# Patient Record
Sex: Female | Born: 1985
Health system: Southern US, Community
[De-identification: ages and names within clinical notes are randomized; demographics above are authoritative.]

## PROBLEM LIST (undated history)

## (undated) DIAGNOSIS — G4733 Obstructive sleep apnea (adult) (pediatric): Secondary | ICD-10-CM

## (undated) DIAGNOSIS — F419 Anxiety disorder, unspecified: Secondary | ICD-10-CM

## (undated) DIAGNOSIS — K219 Gastro-esophageal reflux disease without esophagitis: Secondary | ICD-10-CM

## (undated) DIAGNOSIS — D229 Melanocytic nevi, unspecified: Secondary | ICD-10-CM

## (undated) DIAGNOSIS — R739 Hyperglycemia, unspecified: Secondary | ICD-10-CM

## (undated) DIAGNOSIS — E785 Hyperlipidemia, unspecified: Secondary | ICD-10-CM

## (undated) DIAGNOSIS — G4730 Sleep apnea, unspecified: Secondary | ICD-10-CM

## (undated) DIAGNOSIS — Z9989 Dependence on other enabling machines and devices: Secondary | ICD-10-CM

## (undated) HISTORY — DX: Sleep apnea, unspecified: G47.30

## (undated) HISTORY — DX: Morbid (severe) obesity due to excess calories: E66.01

## (undated) HISTORY — DX: Hyperglycemia, unspecified: R73.9

## (undated) HISTORY — DX: Dependence on other enabling machines and devices: Z99.89

## (undated) HISTORY — DX: Gastro-esophageal reflux disease without esophagitis: K21.9

## (undated) HISTORY — DX: Melanocytic nevi, unspecified: D22.9

## (undated) HISTORY — PX: MOLE REMOVAL: SHX2046

## (undated) HISTORY — DX: Hyperlipidemia, unspecified: E78.5

## (undated) HISTORY — DX: Anxiety disorder, unspecified: F41.9

## (undated) HISTORY — PX: WISDOM TOOTH EXTRACTION: SHX21

## (undated) HISTORY — DX: Obstructive sleep apnea (adult) (pediatric): G47.33

## (undated) HISTORY — PX: OTHER SURGICAL HISTORY: SHX169

---
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
## 2004-10-06 ENCOUNTER — Other Ambulatory Visit: Admission: RE | Admit: 2004-10-06 | Discharge: 2004-10-06 | Payer: Self-pay | Admitting: Obstetrics and Gynecology

## 2005-11-28 ENCOUNTER — Other Ambulatory Visit: Admission: RE | Admit: 2005-11-28 | Discharge: 2005-11-28 | Payer: Self-pay | Admitting: Obstetrics and Gynecology

## 2006-05-07 HISTORY — PX: OTHER SURGICAL HISTORY: SHX169

## 2006-10-27 ENCOUNTER — Emergency Department (HOSPITAL_COMMUNITY): Admission: EM | Admit: 2006-10-27 | Discharge: 2006-10-27 | Payer: Self-pay | Admitting: Emergency Medicine

## 2006-12-03 ENCOUNTER — Other Ambulatory Visit: Admission: RE | Admit: 2006-12-03 | Discharge: 2006-12-03 | Payer: Self-pay | Admitting: Obstetrics & Gynecology

## 2007-12-04 ENCOUNTER — Other Ambulatory Visit: Admission: RE | Admit: 2007-12-04 | Discharge: 2007-12-04 | Payer: Self-pay | Admitting: Obstetrics and Gynecology

## 2010-09-22 NOTE — Op Note (Signed)
[name], Mary Brandt                           ACCOUNT NO.:  [id number]   MEDICAL RECORD NO.:  [id number]                   PATIENT TYPE:  AMB   LOCATION:  DSC                                  FACILITY:  [facility]   PHYSICIAN:  Brantley Persons, M.D.             DATE OF BIRTH:  1986-02-08   DATE OF PROCEDURE:  09/02/2003  DATE OF DISCHARGE:                                 OPERATIVE REPORT   PREOPERATIVE DIAGNOSIS:  Dysplastic nevus with high-grade atypia left  temporoparietal scalp.   POSTOPERATIVE DIAGNOSIS:  Dysplastic nevus with high-grade atypia left  temporoparietal scalp.   PROCEDURE:  1. Excision of 2.0-cm dysplastic nevus with high-grade atypia left     temporoparietal scalp.  2. Intermediate closure of 2.5-cm left scalp incision.   ANESTHESIA:  1% lidocaine with epinephrine.   COMPLICATIONS:  None.   INDICATIONS FOR PROCEDURE:  The patient is an 24 year old Caucasian female  who was referred by Dr. Campbell Stall for evaluation regarding excision of  this dysplastic nevus of the left scalp.  The specimen was biopsied using a  shave biopsy on May 08, 2003.  The pathology report indicates that the  lesion was a dysplastic melanocytic nevus with high-grade atypia that was  histologically worrisome.  Due to this, the lesion needs to undergo a wider  excision.  This has been discussed with the patient as well as her parents,  and they would like to proceed with that.  She is, therefore, brought to the  [facility] for excision of the dysplastic nevus as the  patient does have a history of unusual moles.   PROCEDURE:  The patient was brought into the minor room and placed on the  table in the supine position.  The left temporoparietal  scalp was prepped  with Betadine and draped in a sterile fashion.  The skin and subcutaneous  tissues in the area of the suspicious nevus were injected with 1% lidocaine  with epinephrine.  After adequate hemostasis and  anesthesia had taken  effect, the procedure was begun.  Using loupe magnification, the skin lesion  was examined.  There was a left irregular round, light to medium brown,  lesion with a clear central area.  At least a 4- to 5-mm circumferential  margin was marked around the skin lesion.  This skin lesion was thus excised  with an appropriate margin full thickness through the skin into the  subcutaneous tissues.  The specimen was marked at the 12 o'clock position  and passed off the table to undergo permanent pathologic section evaluation.  Total length of resected specimen was 2.0 cm.  The scalp was then undermined  for easier closure.  The galeal and deep dermal layers were closed using 4-0  Monocryl suture.  The scalp skin was then closed using 5-0 Prolene suture.  The incision was dressed with bacitracin ointment.  There were no  complications.  The patient tolerated the procedure well.  Proper  postoperative wound care instructions were discussed with both the patient  and her father.  She was then discharged home in her father's care in stable  condition.  Followup appointment will be tomorrow in the office.                                               Brantley Persons, M.D.    MC/MEDQ  D:  09/02/2003  T:  09/03/2003  Job:  [id number]

## 2011-06-05 ENCOUNTER — Emergency Department (HOSPITAL_COMMUNITY)
Admission: EM | Admit: 2011-06-05 | Discharge: 2011-06-05 | Disposition: A | Discharge status: Home / Self Care | Payer: 59 | Source: Home / Self Care | Attending: Family Medicine | Admitting: Family Medicine

## 2011-06-05 ENCOUNTER — Emergency Department (INDEPENDENT_AMBULATORY_CARE_PROVIDER_SITE_OTHER): Payer: 59

## 2011-06-05 ENCOUNTER — Encounter (HOSPITAL_COMMUNITY): Payer: Self-pay | Admitting: *Deleted

## 2011-06-05 DIAGNOSIS — J189 Pneumonia, unspecified organism: Secondary | ICD-10-CM

## 2011-06-05 DIAGNOSIS — B999 Unspecified infectious disease: Secondary | ICD-10-CM

## 2011-06-05 MED ORDER — AZITHROMYCIN 250 MG PO TABS: ORAL_TABLET | ORAL | Status: AC

## 2011-06-05 NOTE — ED Notes (Signed)
Pt  Reports  Symptoms  Of  Cough  /  Congestion        Body  Aches  X  10  Days  Symptoms  improved  Somewhat  Then  Came  Back

## 2011-06-05 NOTE — ED Provider Notes (Signed)
Mary Brandt is a 26 y.o. female who presents to Urgent Care today for coughing wheezing and nausea. She initially developed a URI a 1-1/2 weeks ago where she describes some cough and nasal congestion which he treated with Sudafed Mucinex.  However 2 days ago she worsened and now has wheezing coughing and nausea. She had sweating and chills but denies any fever.   She feels generally ill.   She denies any trouble breathing or chest pain, or vomiting or diarrhea.     PMH reviewed.  ROS as above otherwise neg Medications reviewed. No current facility-administered medications for this encounter.   No current outpatient prescriptions on file.    Exam:  BP 120/65  Pulse 73  Temp(Src) 98.7 F (37.1 C) (Oral)  Resp 20  SpO2 100% Gen: Well NAD HEENT: EOMI,  MMM Lungs:  Nl WOB with rhonchi and squeaks bilaterally Heart: RRR no MRG Abd: NABS, NT, ND Exts: Non edematous BL  LE, warm and well perfused.   CXR: No acute disease  Assessment and Plan: 26 year old woman presents with cough and wheezing worsening a week and a half into a upper respiratory infection. I'm concerned about secondary sickening with a bacterial agent. Her x-ray did not clear show a clear lobar consolidation therefore I think pneumococcal pneumonia is less likely. At her age range atypical pneumonia is more likely, therefore I feel a five-day course of azithromycin is warranted.  I asked her to follow with the primary care doctor if she is not improving.  Additionally provided a handout on pneumonia. She expresses understanding.     Clementeen Graham, MD 06/05/11 1740

## 2011-06-06 NOTE — ED Provider Notes (Signed)
Medical screening examination/treatment/procedure(s) were performed by PGY-3 FM resident and as supervising physician I was immediately available for consultation/collaboration.   Sharin Grave, MD   Sharin Grave, MD 06/06/11 [phone]

## 2011-06-26 HISTORY — PX: OTHER SURGICAL HISTORY: SHX169

## 2012-09-25 ENCOUNTER — Encounter: Payer: Self-pay | Admitting: Family Medicine

## 2012-09-25 ENCOUNTER — Ambulatory Visit (INDEPENDENT_AMBULATORY_CARE_PROVIDER_SITE_OTHER): Payer: 59 | Admitting: Family Medicine

## 2012-09-25 VITALS — BP 123/80 | HR 75 | Ht 68.0 in | Wt 320.0 lb

## 2012-09-25 DIAGNOSIS — M25511 Pain in right shoulder: Secondary | ICD-10-CM

## 2012-09-25 DIAGNOSIS — M25519 Pain in unspecified shoulder: Secondary | ICD-10-CM

## 2012-09-25 MED ORDER — TRAMADOL HCL 50 MG PO TABS: 50.0000 mg | ORAL_TABLET | Freq: Four times a day (QID) | ORAL | Status: DC | PRN

## 2012-09-25 MED ORDER — TIZANIDINE HCL 4 MG PO TABS: 4.0000 mg | ORAL_TABLET | Freq: Three times a day (TID) | ORAL | Status: DC

## 2012-09-25 MED ORDER — DICLOFENAC SODIUM 1 % TD GEL: 4.0000 g | Freq: Four times a day (QID) | TRANSDERMAL | Status: DC

## 2012-09-25 NOTE — Progress Notes (Signed)
  Subjective:    Patient ID: Eda Keys, female    DOB: 02/03/1986, 27 y.o.   MRN: [id number]  Murline is here today complaining of right shoulder pain.    Shoulder Pain  The pain is present in the right shoulder. This is a new problem. The current episode started more than 1 month ago. There has been no history of extremity trauma. The problem occurs constantly. The problem has been gradually worsening. The quality of the pain is described as aching. The pain is at a severity of 5/10. The pain is moderate. Pertinent negatives include no numbness. She has tried NSAIDS for the symptoms. The treatment provided no relief.   Review of Systems  Constitutional: Positive for activity change.  Musculoskeletal: Positive for arthralgias.       Pain in right shoulder  Neurological: Negative for weakness and numbness.   Past Medical History  Diagnosis Date  . Asthma   . Arthritis   . Morbid obesity    History   Social History Narrative   Marital Status: Married Government social research officer)    Children:  None    Pets:  None    Living Situation: Lives with his husband.   Occupation: Teacher, music     Education: Engineer, maintenance (IT) ([employer] worker [employer])    Alcohol Use:  Occasional   Tobacco Use:  None    Drug Use:  None   Diet:  Regular   Exercise:  None   Hobbies:  Reading, Movies                   Objective:   Physical Exam  Musculoskeletal:       Right shoulder: She exhibits decreased range of motion and tenderness. She exhibits no swelling, no effusion, no crepitus and normal strength.      Assessment & Plan:

## 2012-09-25 NOTE — Patient Instructions (Addendum)
1)  Shoulder Pain - Take 800 mg of Ibuprofen per day along with the Voltaren Gel up to 4 x per day. Add Tylenol 1000 mg up 3 times per day plus you can take a muscle relaxer.  Soak in a hot tub with Epsom Salt.  You can try a Thermacare but not over the gel.  If you continue to have pain, you can try some tramadol - start with 1 tab at night and slowly increase to up to 4 in a day.     Shoulder Pain The shoulder is the joint that connects your arms to your body. The bones that form the shoulder joint include the upper arm bone (humerus), the shoulder blade (scapula), and the collarbone (clavicle). The top of the humerus is shaped like a ball and fits into a rather flat socket on the scapula (glenoid cavity). A combination of muscles and strong, fibrous tissues that connect muscles to bones (tendons) support your shoulder joint and hold the ball in the socket. Small, fluid-filled sacs (bursae) are located in different areas of the joint. They act as cushions between the bones and the overlying soft tissues and help reduce friction between the gliding tendons and the bone as you move your arm. Your shoulder joint allows a wide range of motion in your arm. This range of motion allows you to do things like scratch your back or throw a ball. However, this range of motion also makes your shoulder more prone to pain from overuse and injury. Causes of shoulder pain can originate from both injury and overuse and usually can be grouped in the following four categories:  Redness, swelling, and pain (inflammation) of the tendon (tendinitis) or the bursae (bursitis).  Instability, such as a dislocation of the joint.  Inflammation of the joint (arthritis).  Broken bone (fracture). HOME CARE INSTRUCTIONS   Apply ice to the sore area.  Put ice in a plastic bag.  Place a towel between your skin and the bag.  Leave the ice on for 15-20 minutes, 3-4 times per day for the first 2 days.  Stop using cold packs if  they do not help with the pain.  If you have a shoulder sling or immobilizer, wear it as long as your caregiver instructs. Only remove it to shower or bathe. Move your arm as little as possible, but keep your hand moving to prevent swelling.  Squeeze a soft ball or foam pad as much as possible to help prevent swelling.  Only take over-the-counter or prescription medicines for pain, discomfort, or fever as directed by your caregiver. SEEK MEDICAL CARE IF:   Your shoulder pain increases, or new pain develops in your arm, hand, or fingers.  Your hand or fingers become cold and numb.  Your pain is not relieved with medicines. SEEK IMMEDIATE MEDICAL CARE IF:   Your arm, hand, or fingers are numb or tingling.  Your arm, hand, or fingers are significantly swollen or turn white or blue. MAKE SURE YOU:   Understand these instructions.  Will watch your condition.  Will get help right away if you are not doing well or get worse. Document Released: 01/31/2005 Document Revised: 01/16/2012 Document Reviewed: 04/07/2011 [facility] Patient Information 2014 [place], [place].  Shoulder Range of Motion Exercises The shoulder is the most flexible joint in the human body. Because of this it is also the most unstable joint in the body. All ages can develop shoulder problems. Early treatment of problems is necessary for a good  outcome. People react to shoulder pain by decreasing the movement of the joint. After a brief period of time, the shoulder can become "frozen". This is an almost complete loss of the ability to move the damaged shoulder. Following injuries your caregivers can give you instructions on exercises to keep your range of motion (ability to move your shoulder freely), or regain it if it has been lost.  EXERCISES EXERCISES TO MAINTAIN THE MOBILITY OF YOUR SHOULDER: Codman's Exercise or Pendulum Exercise  This exercise may be performed in a prone (face-down) lying position or standing while  leaning on a chair with the opposite arm. Its purpose is to relax the muscles in your shoulder and slowly but surely increase the range of motion and to relieve pain.  Lie on your stomach close to the side edge of the bed. Let your weak arm hang over the edge of the bed. Relax your shoulder, arm and hand. Let your shoulder blade relax and drop down.  Slowly and gently swing your arm forward and back. Do not use your neck muscles; relax them. It might be easier to have someone else gently start swinging your arm.  As pain decreases, increase your swing. To start, arm swing should begin at 15 degree angles. In time and as pain lessens, move to 30-45 degree angles. Start with swinging for about 15 seconds, and work towards swinging for 3 to 5 minutes.  This exercise may also be performed in a standing/bent over position.  Stand and hold onto a sturdy chair with your good arm. Bend forward at the waist and bend your knees slightly to help protect your back. Relax your weak arm, let it hang limp. Relax your shoulder blade and let it drop.  Keep your shoulder relaxed and use body motion to swing your arm in small circles.  Stand up tall and relax.  Repeat motion and change direction of circles.  Start with swinging for about 30 seconds, and work towards swinging for 3 to 5 minutes. STRETCHING EXERCISES:  Lift your arm out in front of you with the elbow bent at 90 degrees. Using your other arm gently pull the elbow forward and across your body.  Bend one arm behind you with the palm facing outward. Using the other arm, hold a towel or rope and reach this arm up above your head, then bend it at the elbow to move your wrist to behind your neck. Grab the free end of the towel with the hand behind your back. Gently pull the towel up with the hand behind your neck, gradually increasing the pull on the hand behind the small of your back. Then, gradually pull down with the hand behind the small of your  back. This will pull the hand and arm behind your neck further. Both shoulders will have an increased range of motion with repetition of this exercise. STRENGTHENING EXERCISES:  Standing with your arm at your side and straight out from your shoulder with the elbow bent at 90 degrees, hold onto a small weight and slowly raise your hand so it points straight up in the air. Repeat this five times to begin with, and gradually increase to ten times. Do this four times per day. As you grow stronger you can gradually increase the weight.  Repeat the above exercise, only this time using an elastic band. Start with your hand up in the air and pull down until your hand is by your side. As you grow stronger, gradually increase  the amount you pull by increasing the number or size of the elastic bands. Use the same amount of repetitions.  Standing with your hand at your side and holding onto a weight, gradually lift the hand in front of you until it is over your head. Do the same also with the hand remaining at your side and lift the hand away from your body until it is again over your head. Repeat this five times to begin with, and gradually increase to ten times. Do this four times per day. As you grow stronger you can gradually increase the weight. Document Released: 01/20/2003 Document Revised: 07/16/2011 Document Reviewed: 04/23/2005 [facility] Patient Information 2014 [place], [place].

## 2012-10-11 ENCOUNTER — Encounter: Payer: Self-pay | Admitting: Family Medicine

## 2012-10-11 DIAGNOSIS — M25519 Pain in unspecified shoulder: Secondary | ICD-10-CM | POA: Insufficient documentation

## 2012-10-11 NOTE — Assessment & Plan Note (Addendum)
She has received a steroid injection from her husband who is a Scientist, clinical (histocompatibility and immunogenetics).  She says that it really did not help her pain.  She was given several medications for pain.  If she does not improve, she'll follow up with Dr. Pearletha Forge.

## 2012-11-12 ENCOUNTER — Other Ambulatory Visit: Payer: Self-pay | Admitting: Dermatology

## 2012-12-22 ENCOUNTER — Encounter: Payer: Self-pay | Admitting: Certified Nurse Midwife

## 2012-12-22 ENCOUNTER — Telehealth: Payer: Self-pay | Admitting: Certified Nurse Midwife

## 2012-12-22 NOTE — Telephone Encounter (Signed)
Patient wants to know when she she got her Implanon.

## 2012-12-22 NOTE — Telephone Encounter (Signed)
Patient notified of Implanon insertion 06/26/2011. Per Dr. Tresa Res. Noted in paper chart. Patient appreciative of this information.

## 2013-01-07 ENCOUNTER — Encounter: Payer: Self-pay | Admitting: Certified Nurse Midwife

## 2013-01-09 ENCOUNTER — Ambulatory Visit: Payer: Self-pay | Admitting: Certified Nurse Midwife

## 2013-01-13 ENCOUNTER — Ambulatory Visit: Payer: Self-pay | Admitting: Nurse Practitioner

## 2013-01-13 ENCOUNTER — Ambulatory Visit: Payer: Self-pay | Admitting: Certified Nurse Midwife

## 2013-01-20 ENCOUNTER — Ambulatory Visit: Payer: Self-pay | Admitting: Certified Nurse Midwife

## 2013-01-30 ENCOUNTER — Ambulatory Visit: Payer: Self-pay | Admitting: Nurse Practitioner

## 2013-06-07 IMAGING — CR DG CHEST 2V
2 series · 2 of 2 positions shown · non-contrast
Comparison: None.

CLINICAL DATA: Pneumonia.  Cough and congestion.

CHEST - 2 VIEW

[view not recorded (1 of 2)]
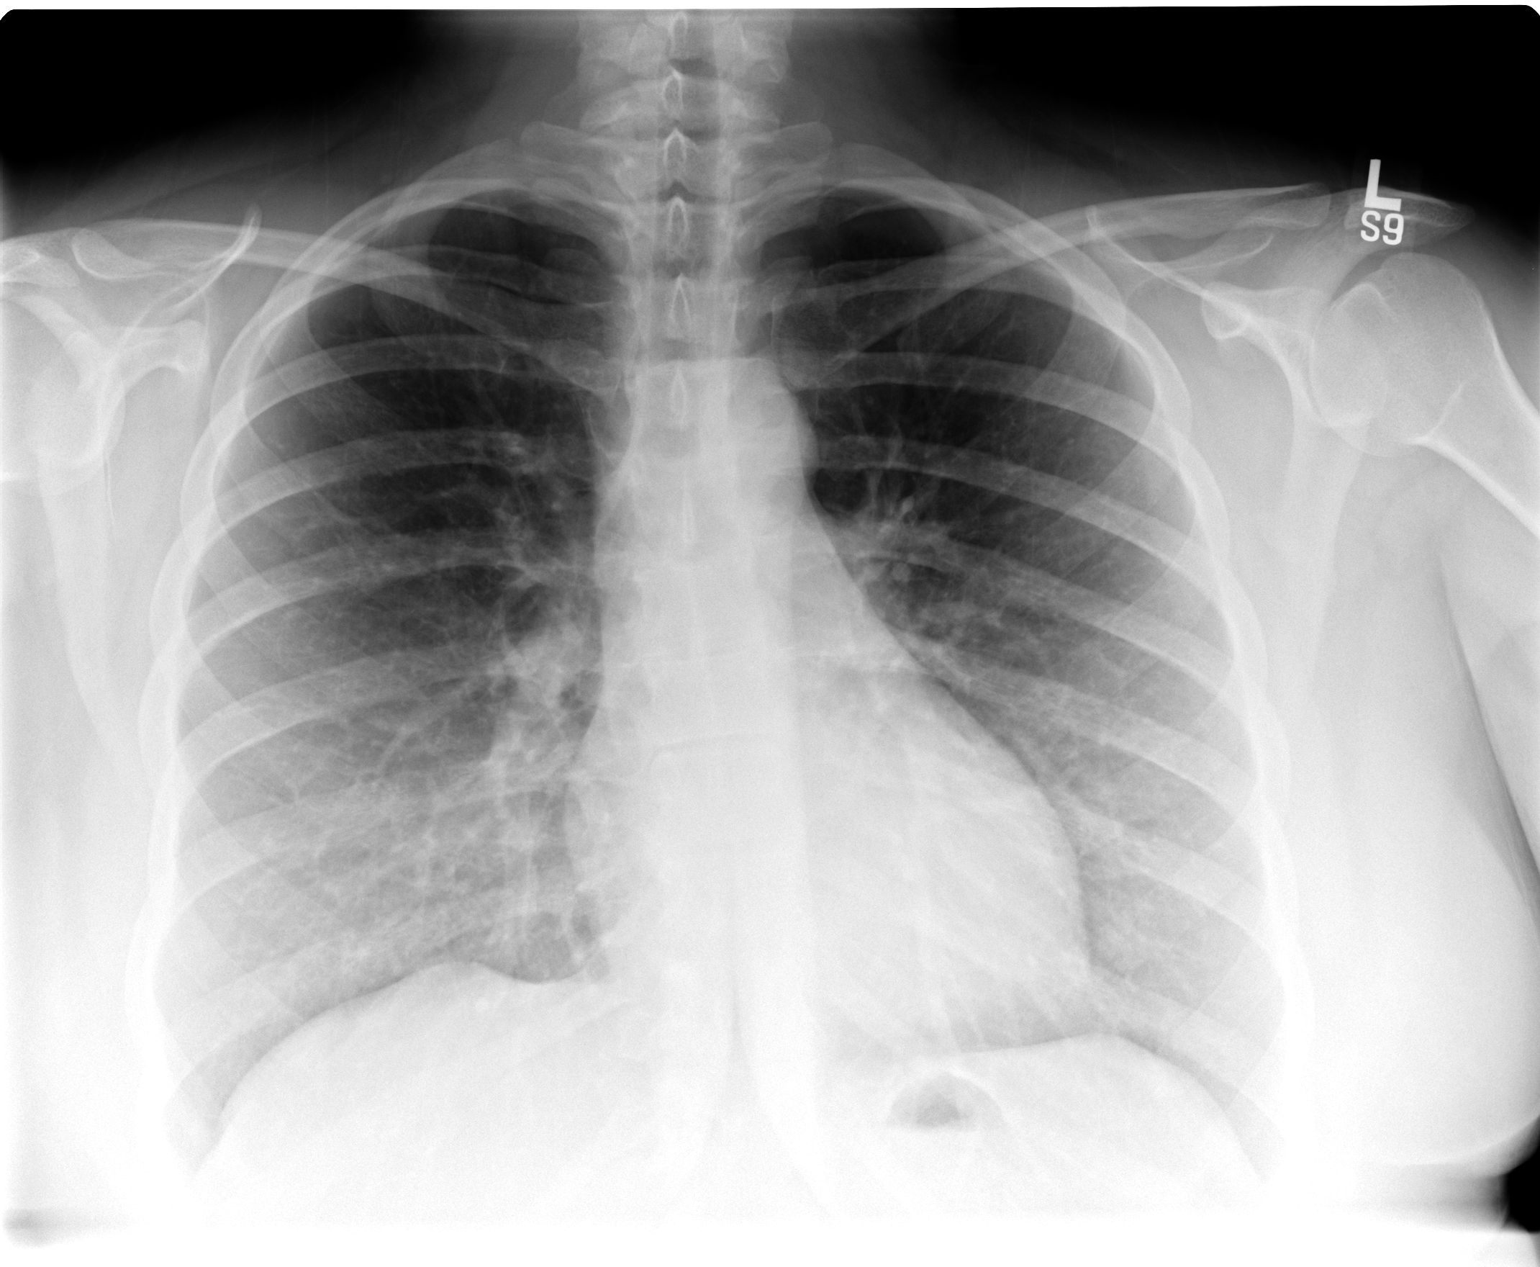

[view not recorded (2 of 2)]
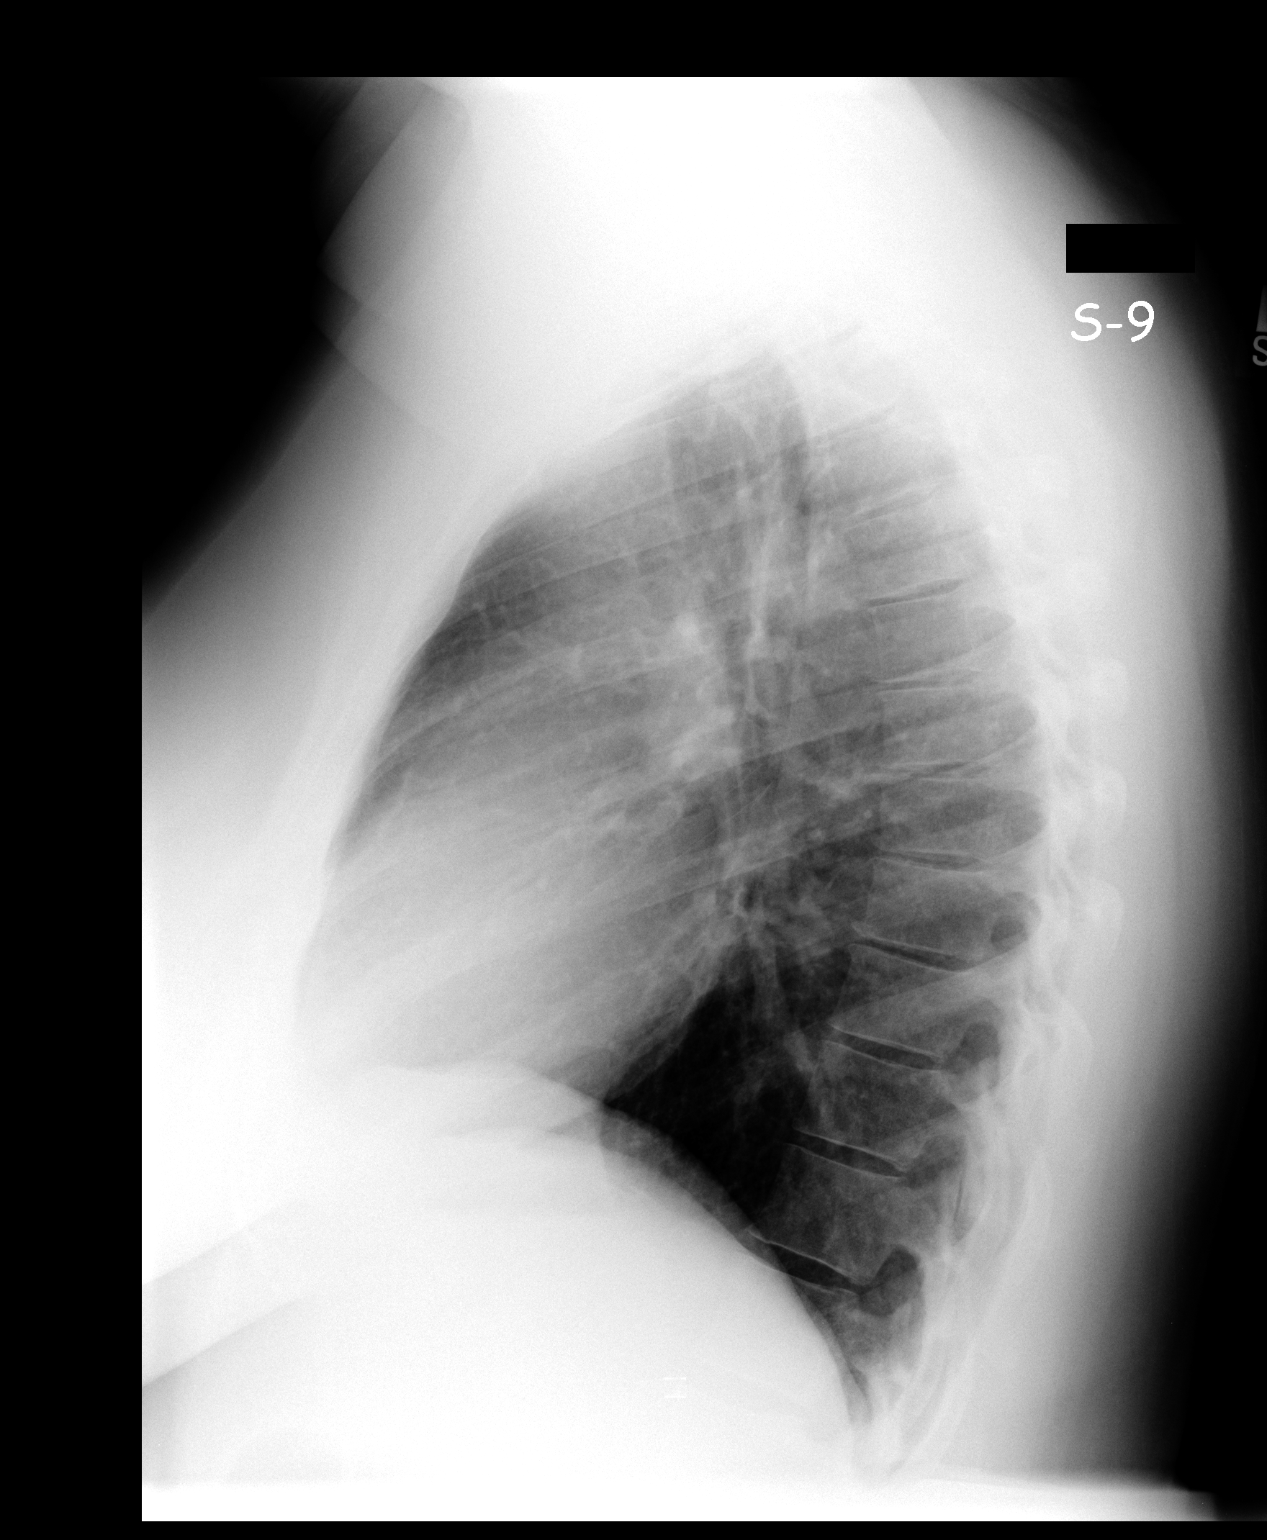

[2 of 2 positions shown; findings below may reference images not displayed]

FINDINGS: Cardiopericardial silhouette within normal limits.
Mediastinal contours normal. Trachea midline.  No airspace disease
or effusion.
IMPRESSION: No active cardiopulmonary disease.

## 2013-06-29 ENCOUNTER — Encounter (INDEPENDENT_AMBULATORY_CARE_PROVIDER_SITE_OTHER): Payer: Self-pay

## 2013-06-29 ENCOUNTER — Encounter (HOSPITAL_COMMUNITY): Payer: Self-pay | Admitting: Psychiatry

## 2013-06-29 ENCOUNTER — Ambulatory Visit (INDEPENDENT_AMBULATORY_CARE_PROVIDER_SITE_OTHER): Payer: 59 | Admitting: Psychiatry

## 2013-06-29 VITALS — BP 125/88 | HR 81 | Ht 67.0 in | Wt 342.0 lb

## 2013-06-29 DIAGNOSIS — Z79899 Other long term (current) drug therapy: Secondary | ICD-10-CM

## 2013-06-29 DIAGNOSIS — F3289 Other specified depressive episodes: Secondary | ICD-10-CM

## 2013-06-29 DIAGNOSIS — F411 Generalized anxiety disorder: Secondary | ICD-10-CM

## 2013-06-29 DIAGNOSIS — F329 Major depressive disorder, single episode, unspecified: Secondary | ICD-10-CM

## 2013-06-29 DIAGNOSIS — F419 Anxiety disorder, unspecified: Secondary | ICD-10-CM

## 2013-06-29 MED ORDER — DULOXETINE HCL 60 MG PO CPEP: 60.0000 mg | ORAL_CAPSULE | Freq: Every day | ORAL | Status: DC
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
## 2013-07-22 ENCOUNTER — Ambulatory Visit (HOSPITAL_COMMUNITY): Payer: Self-pay | Admitting: Psychiatry

## 2013-07-29 ENCOUNTER — Encounter (HOSPITAL_COMMUNITY): Payer: Self-pay | Admitting: Psychiatry

## 2013-07-29 NOTE — Progress Notes (Signed)
Patient is no-show today. This encounter was created in error - please disregard. This encounter was created in error - please disregard. This encounter was created in error - please disregard. Patient is a no-show today. This encounter was created in error - please disregard.

## 2013-10-05 ENCOUNTER — Other Ambulatory Visit (HOSPITAL_COMMUNITY): Payer: Self-pay | Admitting: Psychiatry

## 2013-10-05 LAB — COMPREHENSIVE METABOLIC PANEL
ALT: 16 U/L (ref 0–35)
AST: 12 U/L (ref 0–37)
Albumin: 3.8 g/dL (ref 3.5–5.2)
Alkaline Phosphatase: 101 U/L (ref 39–117)
BILIRUBIN TOTAL: 0.2 mg/dL (ref 0.2–1.2)
BUN: 17 mg/dL (ref 6–23)
CO2: 25 meq/L (ref 19–32)
CREATININE: 0.89 mg/dL (ref 0.50–1.10)
Calcium: 9.4 mg/dL (ref 8.4–10.5)
Chloride: 102 mEq/L (ref 96–112)
Glucose, Bld: 99 mg/dL (ref 70–99)
Potassium: 4.2 mEq/L (ref 3.5–5.3)
Sodium: 137 mEq/L (ref 135–145)
Total Protein: 6.9 g/dL (ref 6.0–8.3)

## 2013-10-05 LAB — CBC WITH DIFFERENTIAL/PLATELET
Basophils Absolute: 0 10*3/uL (ref 0.0–0.1)
Basophils Relative: 0 % (ref 0–1)
EOS PCT: 2 % (ref 0–5)
Eosinophils Absolute: 0.2 10*3/uL (ref 0.0–0.7)
HEMATOCRIT: 38.5 % (ref 36.0–46.0)
HEMOGLOBIN: 12.8 g/dL (ref 12.0–15.0)
LYMPHS ABS: 2.4 10*3/uL (ref 0.7–4.0)
LYMPHS PCT: 28 % (ref 12–46)
MCH: 27.1 pg (ref 26.0–34.0)
MCHC: 33.2 g/dL (ref 30.0–36.0)
MCV: 81.4 fL (ref 78.0–100.0)
MONO ABS: 0.6 10*3/uL (ref 0.1–1.0)
MONOS PCT: 7 % (ref 3–12)
Neutro Abs: 5.4 10*3/uL (ref 1.7–7.7)
Neutrophils Relative %: 63 % (ref 43–77)
Platelets: 386 10*3/uL (ref 150–400)
RBC: 4.73 MIL/uL (ref 3.87–5.11)
RDW: 15.8 % — AB (ref 11.5–15.5)
WBC: 8.6 10*3/uL (ref 4.0–10.5)

## 2013-10-05 LAB — LIPID PANEL
CHOLESTEROL: 169 mg/dL (ref 0–200)
HDL: 55 mg/dL (ref >39)
LDL CALC: 101 mg/dL — AB (ref 0–99)
TRIGLYCERIDES: 67 mg/dL (ref <150)
Total CHOL/HDL Ratio: 3.1 Ratio
VLDL: 13 mg/dL (ref 0–40)

## 2013-10-05 LAB — TSH: TSH: 1.576 u[IU]/mL (ref 0.350–4.500)

## 2013-10-05 LAB — HEMOGLOBIN A1C
Hgb A1c MFr Bld: 5.9 % — ABNORMAL HIGH (ref <5.7)
MEAN PLASMA GLUCOSE: 123 mg/dL — AB (ref <117)

## 2013-10-07 ENCOUNTER — Encounter (HOSPITAL_COMMUNITY): Payer: Self-pay | Admitting: Psychiatry

## 2013-10-07 ENCOUNTER — Ambulatory Visit (INDEPENDENT_AMBULATORY_CARE_PROVIDER_SITE_OTHER): Payer: 59 | Admitting: Psychiatry

## 2013-10-07 VITALS — BP 138/78 | HR 82 | Ht 67.0 in | Wt 346.0 lb

## 2013-10-07 DIAGNOSIS — F3289 Other specified depressive episodes: Secondary | ICD-10-CM

## 2013-10-07 DIAGNOSIS — F329 Major depressive disorder, single episode, unspecified: Secondary | ICD-10-CM

## 2013-10-07 DIAGNOSIS — F419 Anxiety disorder, unspecified: Secondary | ICD-10-CM

## 2013-10-07 DIAGNOSIS — F411 Generalized anxiety disorder: Secondary | ICD-10-CM

## 2013-10-07 MED ORDER — DULOXETINE HCL 60 MG PO CPEP: 60.0000 mg | ORAL_CAPSULE | Freq: Every day | ORAL | Status: DC
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
## 2014-01-07 ENCOUNTER — Encounter (HOSPITAL_COMMUNITY): Payer: Self-pay | Admitting: Psychiatry

## 2014-01-07 ENCOUNTER — Ambulatory Visit (INDEPENDENT_AMBULATORY_CARE_PROVIDER_SITE_OTHER): Payer: 59 | Admitting: Psychiatry

## 2014-01-07 VITALS — BP 108/83 | HR 80 | Ht 67.0 in | Wt 358.0 lb

## 2014-01-07 DIAGNOSIS — F411 Generalized anxiety disorder: Secondary | ICD-10-CM

## 2014-01-07 DIAGNOSIS — F3289 Other specified depressive episodes: Secondary | ICD-10-CM

## 2014-01-07 DIAGNOSIS — F329 Major depressive disorder, single episode, unspecified: Secondary | ICD-10-CM

## 2014-01-07 MED ORDER — DULOXETINE HCL 30 MG PO CPEP: 30.0000 mg | ORAL_CAPSULE | Freq: Every day | ORAL | Status: DC

## 2014-01-07 MED ORDER — BUPROPION HCL ER (XL) 150 MG PO TB24: ORAL_TABLET | ORAL | Status: DC

## 2014-01-07 NOTE — Progress Notes (Signed)
[facility] [phone] Progress Note  Mary Brandt [id number] 28 y.o.  01/07/2014 8:58 AM  Chief Complaint:  I have gained a lot of weight.  I want to try different medication.    History of Present Illness:  Mary Brandt came for her followup appointment.  She is taking Cymbalta 60 mg daily.  On her last visit with how to blood weight gain, regular exercise and watching her calorie intake.  She started these things however she continued to have issues controlling her weight.  She has gained weight from the last visit.  She wants to try a different medication.  She started free classes online which is offered by [employer].  She is hoping to take exam next July.  She denies any major panic attack anxiety attack.  She sleeping better.  Her energy level is okay.  She denies any crying spells, irritability or any anger.  She is scheduled to see her primary care physician in this month since her previous physician at the practice.  Patient denies any paranoia or any hallucination.  She denies any tremors or shakes.  Her vitals are stable except she has gained weight from the last visit.  Patient does not drink or use any illegal substances.  She lives with her husband who is very supportive.  She has no children.    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  She endorsed symptoms of social isolation, lack of energy, irritability, panic attack, excessive anxiety , withdrawn and lack of interest in her daily activities.  [facility] which worked very well until December 2014 she stopped.  She had significant withdrawal symptoms when she stops Cymbalta.   Anxiety: Yes Bipolar Disorder: No Depression:  Yes Mania: No Psychosis: No Schizophrenia: No Personality Disorder: No Hospitalization for psychiatric illness: No History of Electroconvulsive Shock Therapy: No Prior Suicide Attempts: No  Medical History; Patient has morbid obesity.    Review of Systems: Psychiatric: Agitation: No Hallucination: No Depressed Mood: No Insomnia: No Hypersomnia: No Altered Concentration: No Feels Worthless: No Grandiose Ideas: No Belief In Special Powers: No New/Increased Substance Abuse: No Compulsions: No  Neurologic: Headache: No Seizure: No Paresthesias: No   Outpatient Encounter Prescriptions as of 01/07/2014  Medication Sig  . buPROPion (WELLBUTRIN XL) 150 MG 24 hr tablet Take 1 tab daily in am for 2 weeks and than 2 tab daily  . DULoxetine (CYMBALTA) 30 MG capsule Take 1 capsule (30 mg total) by mouth daily.  . [DISCONTINUED] DULoxetine (CYMBALTA) 60 MG capsule Take 1 capsule (60 mg total) by mouth daily.    No results found for this or any previous visit (from the past 2160 hour(s)).    Physical Exam: Constitutional:  BP 108/83  Pulse 80  Ht 5\' 7"  (1.702 m)  Wt 358 lb (162.388 kg)  BMI 56.06 kg/m2  Musculoskeletal: Strength & Muscle Tone: within normal limits Gait & Station: normal Patient leans: N/A  Mental Status Examination;  Patient is casually dressed and fairly groomed.  She is cooperative.  She maintains fair fair eye contact.  Her speech is clear and coherent.  Her thought process is logical and goal-directed.  She describes her mood anxious because of weight gain  and her affect is mood appropriate.  She denies any auditory or visual hallucination.  There were no paranoia, delusions obsession present at this time.  Her fund of knowledge is adequate.  She has no tremors or shakes.  She denies any active or passive suicidal thoughts or homicidal thoughts.  There were no flight of ideas or any loose association.  She is alert and oriented x3.  Her insight judgment  and impulse control is okay.   Established Problem, Stable/Improving (1), Review of Psycho-Social Stressors (1), Review or order clinical lab tests (1), New Problem, with no additional work-up planned (3), Review of Last Therapy Session (1), Review of Medication Regimen & Side Effects (2) and Review of New Medication or Change in Dosage (2)  Assessment: Axis I: Anxiety disorder NOS, depressive disorder NOS  Axis II: Deferred  Axis III:  Past Medical History  Diagnosis Date  . Asthma   . Arthritis   . Morbid obesity     Axis IV: Mild to moderate   Plan:  The patient wants to try a different medication.  She has done some research on Wellbutrin which may cause weight loss.  I had a long discussion with the patient about medication side effects.  I reviewed her records that last year when she was taking Cymbalta 120 mg from her previous provider, her weight was 320 pound and now she is taking Cymbalta only 60 mg she has not able to lose the weight.  She also mentioned that she has family history of obesity.  Her sister, father has obesity and her mother also has issues with her weight.  Patient remember started weight gain when she was 28 years old.  I explained that she has withdrawal symptoms from Cymbalta and if we cut down to Cymbalta or stop the Cymbalta Carbatrol symptoms may come back.  I do not believe Cymbalta is the only reason that she is gaining weight .  However patient insist to try Wellbutrin .  I will do cross taper , start Wellbutrin XL 150 mg one a day for 2 weeks and gradually increased to 300 and due to Cymbalta 30 mg for 2 weeks and then stopping it.  I recommended to call us back if she has worsening of her anxiety symptoms.  Explained that Wellbutrin can cause him to be getting headaches, nausea and tremors.  I also suggested that she should talk to her primary care physician on her upcoming visit about weight loss program and other method including bariatric surgery for  weight loss.  Patient acknowledged and agreed.  Recommended to call us back if she has any question or any concern.  I will see her again in 4 weeks.  More than 50% of the time spent in psychoeducation, counseling and coordination of care.  Discuss safety plan that anytime having active suicidal thoughts or homicidal thoughts then patient need to call 911 or go to the local emergency room.    ARFEEN,SYED T., MD 01/07/2014

## 2014-01-26 ENCOUNTER — Encounter: Payer: Self-pay | Admitting: Family

## 2014-01-26 ENCOUNTER — Telehealth: Payer: Self-pay | Admitting: Certified Nurse Midwife

## 2014-01-26 ENCOUNTER — Ambulatory Visit (INDEPENDENT_AMBULATORY_CARE_PROVIDER_SITE_OTHER): Payer: 59 | Admitting: Family

## 2014-01-26 VITALS — BP 120/80 | HR 86 | Ht 68.0 in | Wt 357.7 lb

## 2014-01-26 DIAGNOSIS — R739 Hyperglycemia, unspecified: Secondary | ICD-10-CM | POA: Insufficient documentation

## 2014-01-26 DIAGNOSIS — R7309 Other abnormal glucose: Secondary | ICD-10-CM

## 2014-01-26 DIAGNOSIS — K219 Gastro-esophageal reflux disease without esophagitis: Secondary | ICD-10-CM | POA: Insufficient documentation

## 2014-01-26 DIAGNOSIS — F411 Generalized anxiety disorder: Secondary | ICD-10-CM

## 2014-01-26 LAB — COMPREHENSIVE METABOLIC PANEL
ALBUMIN: 3.6 g/dL (ref 3.5–5.2)
ALT: 21 U/L (ref 0–35)
AST: 17 U/L (ref 0–37)
Alkaline Phosphatase: 94 U/L (ref 39–117)
BUN: 14 mg/dL (ref 6–23)
CHLORIDE: 104 meq/L (ref 96–112)
CO2: 26 meq/L (ref 19–32)
Calcium: 8.7 mg/dL (ref 8.4–10.5)
Creatinine, Ser: 0.9 mg/dL (ref 0.4–1.2)
GFR: 75.68 mL/min (ref >60.00)
GLUCOSE: 89 mg/dL (ref 70–99)
POTASSIUM: 3.7 meq/L (ref 3.5–5.1)
SODIUM: 136 meq/L (ref 135–145)
TOTAL PROTEIN: 7.8 g/dL (ref 6.0–8.3)
Total Bilirubin: 0.4 mg/dL (ref 0.2–1.2)

## 2014-01-26 LAB — HEMOGLOBIN A1C: Hgb A1c MFr Bld: 5.9 % (ref 4.6–6.5)

## 2014-01-26 MED ORDER — OMEPRAZOLE 40 MG PO CPDR: 40.0000 mg | DELAYED_RELEASE_CAPSULE | Freq: Every day | ORAL | Status: DC

## 2014-01-26 NOTE — Progress Notes (Signed)
Subjective:    Patient ID: Mary Brandt, female    DOB: [birth year], 28 y.o.   MRN: [id number]  HPI 28 year old white female, obese with a history of depression and anxiety is in today to be established. Recently developed recurrence of hyperglycemia that was identified by psychiatry. A1c was 5.9. Reports exercising 2-3 times per week doing cardio on the elliptical or treadmill. Sees gynecology for female care. Last Pap smear was in 2013 was normal. Reports her sister was recently diagnosed with polycystic ovarian syndrome and therefore has concerns. Father has type 2 diabetes.  Reports a history of GERD and currently takes omeprazole 20 mg daily and Zantac as needed. However, recently has been having more heartburn and indigestion particularly first thing in the morning. Denies any dietary correlation.    Review of Systems  Constitutional: Negative.   Respiratory: Negative.   Cardiovascular: Negative.   Gastrointestinal: Negative for diarrhea, constipation and abdominal distention.       Heartburn and indigestion  Endocrine: Negative.   Genitourinary: Negative.   Musculoskeletal: Negative.   Skin: Negative.   Allergic/Immunologic: Negative.   Neurological: Negative.   Hematological: Negative.   Psychiatric/Behavioral: Negative.    Past Medical History  Diagnosis Date  . Asthma   . Arthritis   . Morbid obesity     History   Social History  . Marital Status: Married    Spouse Name: Mortimer Fries    Number of Children: 0  . Years of Education: 9   Occupational History  . LEGAL ASSISTANT    . [employer] History Main Topics  . Smoking status: Never Smoker   . Smokeless tobacco: Not on file  . Alcohol Use: No     Comment: Occasional   . Drug Use: No  . Sexual Activity: Yes    Partners: Male    Birth Control/ Protection: IUD   Other Topics Concern  . Not on file   Social History Narrative   Marital Status: Married Chief Operating Officer)    Children:  None    Pets:  None    Living Situation: Lives with his husband.   Occupation: Civil Service fast streamer     Education: Forensic psychologist ([employer]) [employer])    Alcohol Use:  Occasional   Tobacco Use:  None    Drug Use:  None   Diet:  Regular   Exercise:  None   Hobbies:  Reading, Movies                 Past Surgical History  Procedure Laterality Date  . Other surgical history Left [id number]     Implanon  . Intrauterine device (iud) insertion    . Wisdom tooth extraction    . Mole removal      Family History  Problem Relation Age of Onset  . Atrial fibrillation Mother   . Diabetes Father     No Known Allergies  Current Outpatient Prescriptions on File Prior to Visit  Medication Sig Dispense Refill  . buPROPion (WELLBUTRIN XL) 150 MG 24 hr tablet Take 1 tab daily in am for 2 weeks and than 2 tab daily  60 tablet  0  . DULoxetine (CYMBALTA) 30 MG capsule Take 1 capsule (30 mg total) by mouth daily.  30 capsule  0   No current facility-administered medications on file prior to visit.    BP 120/80  Pulse 86  Ht 5\' 8"  (1.727 m)  Wt 357  lb 11.2 oz (162.252 kg)  BMI 54.40 kg/m2  LMP 09/01/2015chart    Objective:   Physical Exam  Constitutional: She is oriented to person, place, and time. She appears well-developed and well-nourished.  Morbidly obese  HENT:  Right Ear: External ear normal.  Left Ear: External ear normal.  Nose: Nose normal.  Mouth/Throat: Oropharynx is clear and moist.  Neck: Normal range of motion. Neck supple. No thyromegaly present.  Cardiovascular: Normal rate, regular rhythm and normal heart sounds.   Pulmonary/Chest: Effort normal and breath sounds normal.  Abdominal: Soft. Bowel sounds are normal.  Musculoskeletal: Normal range of motion.  Neurological: She is alert and oriented to person, place, and time.  Skin: Skin is warm and dry.  Psychiatric: She has a normal mood and affect.          Assessment &  Plan:  Kaetlyn was seen today for establish care.  Diagnoses and associated orders for this visit:  Hyperglycemia - Ambulatory referral to diabetic education - Hemoglobin A1c - CMP  Morbid obesity - Ambulatory referral to diabetic education - Hemoglobin A1c - CMP  Gastroesophageal reflux disease without esophagitis  Generalized anxiety disorder  Other Orders - omeprazole (PRILOSEC) 40 MG capsule; Take 1 capsule (40 mg total) by mouth daily.    Follow with gynecology as scheduled for concerns of polycystic ovarian syndrome. Increase omeprazole to 40 mg to help combat GERD. GERD family precautions provided today. Weight reduction strongly encouraged. Exercise 45 minutes at least 4 days per week. Diabetes education class.

## 2014-01-26 NOTE — Telephone Encounter (Signed)
Patient is asking when does she need to have Implanon exchanged.?

## 2014-01-26 NOTE — Telephone Encounter (Signed)
Left message to call [facility] at [phone]. Need to notify patient will need exchange before 06/25/2014.  Ellene Route, LPN at 9/56/2130  8:65 PM      Status: Signed            Patient notified of Implanon insertion 06/26/2011. Per Dr. Joan Flores. Noted in paper chart. Patient appreciative of this information.         Darrel Reach at 12/22/2012  2:17 PM      Status: Signed            Patient wants to know when she she got her Implanon.

## 2014-01-26 NOTE — Progress Notes (Signed)
Pre visit review using our clinic review tool, if applicable. No additional management support is needed unless otherwise documented below in the visit note. 

## 2014-01-26 NOTE — Patient Instructions (Signed)
Food Choices for Gastroesophageal Reflux Disease When you have gastroesophageal reflux disease (GERD), the foods you eat and your eating habits are very important. Choosing the right foods can help ease the discomfort of GERD. WHAT GENERAL GUIDELINES DO I NEED TO FOLLOW?  Choose fruits, vegetables, whole grains, low-fat dairy products, and low-fat meat, fish, and poultry.  Limit fats such as oils, salad dressings, butter, nuts, and avocado.  Keep a food diary to identify foods that cause symptoms.  Avoid foods that cause reflux. These may be different for different people.  Eat frequent small meals instead of three large meals each day.  Eat your meals slowly, in a relaxed setting.  Limit fried foods.  Cook foods using methods other than frying.  Avoid drinking alcohol.  Avoid drinking large amounts of liquids with your meals.  Avoid bending over or lying down until 2-3 hours after eating. WHAT FOODS ARE NOT RECOMMENDED? The following are some foods and drinks that may worsen your symptoms: Vegetables Tomatoes. Tomato juice. Tomato and spaghetti sauce. Chili peppers. Onion and garlic. Horseradish. Fruits Oranges, grapefruit, and lemon (fruit and juice). Meats High-fat meats, fish, and poultry. This includes hot dogs, ribs, ham, sausage, salami, and bacon. Dairy Whole milk and chocolate milk. Sour cream. Cream. Butter. Ice cream. Cream cheese.  Beverages Coffee and tea, with or without caffeine. Carbonated beverages or energy drinks. Condiments Hot sauce. Barbecue sauce.  Sweets/Desserts Chocolate and cocoa. Donuts. Peppermint and spearmint. Fats and Oils High-fat foods, including [place] fries and potato chips. Other Vinegar. Strong spices, such as black pepper, white pepper, red pepper, cayenne, curry powder, cloves, ginger, and chili powder. The items listed above may not be a complete list of foods and beverages to avoid. Contact your dietitian for more  information. Document Released: 04/23/2005 Document Revised: 04/28/2013 Document Reviewed: 02/25/2013 [facility] Patient Information 2015 [place], [place]. This information is not intended to replace advice given to you by your health care provider. Make sure you discuss any questions you have with your health care provider.   Diabetes and Exercise Exercising regularly is important. It is not just about losing weight. It has many health benefits, such as:  Improving your overall fitness, flexibility, and endurance.  Increasing your bone density.  Helping with weight control.  Decreasing your body fat.  Increasing your muscle strength.  Reducing stress and tension.  Improving your overall health. People with diabetes who exercise gain additional benefits because exercise:  Reduces appetite.  Improves the body's use of blood sugar (glucose).  Helps lower or control blood glucose.  Decreases blood pressure.  Helps control blood lipids (such as cholesterol and triglycerides).  Improves the body's use of the hormone insulin by:  Increasing the body's insulin sensitivity.  Reducing the body's insulin needs.  Decreases the risk for heart disease because exercising:  Lowers cholesterol and triglycerides levels.  Increases the levels of good cholesterol (such as high-density lipoproteins [HDL]) in the body.  Lowers blood glucose levels. YOUR ACTIVITY PLAN  Choose an activity that you enjoy and set realistic goals. Your health care provider or diabetes educator can help you make an activity plan that works for you. Exercise regularly as directed by your health care provider. This includes:  Performing resistance training twice a week such as push-ups, sit-ups, lifting weights, or using resistance bands.  Performing 150 minutes of cardio exercises each week such as walking, running, or playing sports.  Staying active and spending no more than 90 minutes at one time being  inactive. Even short bursts of exercise are good for you. Three 10-minute sessions spread throughout the day are just as beneficial as a single 30-minute session. Some exercise ideas include:  Taking the dog for a walk.  Taking the stairs instead of the elevator.  Dancing to your favorite song.  Doing an exercise video.  Doing your favorite exercise with a friend. RECOMMENDATIONS FOR EXERCISING WITH TYPE 1 OR TYPE 2 DIABETES   Check your blood glucose before exercising. If blood glucose levels are greater than 240 mg/dL, check for urine ketones. Do not exercise if ketones are present.  Avoid injecting insulin into areas of the body that are going to be exercised. For example, avoid injecting insulin into:  The arms when playing tennis.  The legs when jogging.  Keep a record of:  Food intake before and after you exercise.  Expected peak times of insulin action.  Blood glucose levels before and after you exercise.  The type and amount of exercise you have done.  Review your records with your health care provider. Your health care provider will help you to develop guidelines for adjusting food intake and insulin amounts before and after exercising.  If you take insulin or oral hypoglycemic agents, watch for signs and symptoms of hypoglycemia. They include:  Dizziness.  Shaking.  Sweating.  Chills.  Confusion.  Drink plenty of water while you exercise to prevent dehydration or heat stroke. Body water is lost during exercise and must be replaced.  Talk to your health care provider before starting an exercise program to make sure it is safe for you. Remember, almost any type of activity is better than none. Document Released: 07/14/2003 Document Revised: 09/07/2013 Document Reviewed: 09/30/2012 [facility] Patient Information 2015 [place], [place]. This information is not intended to replace advice given to you by your health care provider. Make sure you discuss any questions  you have with your health care provider.

## 2014-01-27 NOTE — Telephone Encounter (Signed)
Pt notified of message below. She stated she has a appt coming up and will get more information about getting the exchange then. She agreed and voiced understanding. Encounter closed

## 2014-01-29 ENCOUNTER — Encounter: Payer: Self-pay | Admitting: Certified Nurse Midwife

## 2014-02-08 ENCOUNTER — Encounter (HOSPITAL_COMMUNITY): Payer: Self-pay | Admitting: Psychiatry

## 2014-02-08 NOTE — Progress Notes (Signed)
No show today   This encounter was created in error - please disregard. 

## 2014-02-17 ENCOUNTER — Encounter: Payer: Self-pay | Admitting: Certified Nurse Midwife

## 2014-02-17 ENCOUNTER — Ambulatory Visit (INDEPENDENT_AMBULATORY_CARE_PROVIDER_SITE_OTHER): Payer: 59 | Admitting: Certified Nurse Midwife

## 2014-02-17 VITALS — BP 104/70 | HR 68 | Resp 16 | Ht 68.5 in | Wt 358.0 lb

## 2014-02-17 DIAGNOSIS — Z3046 Encounter for surveillance of implantable subdermal contraceptive: Secondary | ICD-10-CM

## 2014-02-17 DIAGNOSIS — Z01419 Encounter for gynecological examination (general) (routine) without abnormal findings: Secondary | ICD-10-CM

## 2014-02-17 DIAGNOSIS — Z308 Encounter for other contraceptive management: Secondary | ICD-10-CM

## 2014-02-17 DIAGNOSIS — Z0000 Encounter for general adult medical examination without abnormal findings: Secondary | ICD-10-CM

## 2014-02-17 DIAGNOSIS — Z124 Encounter for screening for malignant neoplasm of cervix: Secondary | ICD-10-CM

## 2014-02-17 LAB — POCT URINALYSIS DIPSTICK
BILIRUBIN UA: NEGATIVE
Glucose, UA: NEGATIVE
KETONES UA: NEGATIVE
LEUKOCYTES UA: NEGATIVE
Nitrite, UA: NEGATIVE
Protein, UA: NEGATIVE
Urobilinogen, UA: NEGATIVE
pH, UA: 5

## 2014-02-17 NOTE — Progress Notes (Signed)
28 y.o. G0P0000 Married Caucasian Fe here for annual exam. Periods sporadic and spotting with Implanon. Desires removal soon, due to bleeding profile, plans OCP again. Patient aware she has gained weight since last aex 01/19/12. Recent visit with PCP for labs and aex, glucose screen borderline, has appointment with [facility] for nutritional counseling. Patient trying to work on exercise. She would like to have pregnancy,but is aware of the concerns with her weight. No other health issues today.  Patient's last menstrual period was 02/17/2014.          Sexually active: Yes.    The current method of family planning is implanon.    Exercising: Yes.    cardio Smoker:  no  Health Maintenance: Pap:  01-01-11 neg MMG:  none Colonoscopy:  none BMD:   none TDaP:  2008 Labs: Poct urine-rbc 1+ menses Self breast exam: done monthly   reports that she has never smoked. She does not have any smokeless tobacco history on file. She reports that she does not drink alcohol or use illicit drugs.  Past Medical History  Diagnosis Date  . Asthma   . Anxiety   . Asthma   . Arthritis   . Morbid obesity     Past Surgical History  Procedure Laterality Date  . Finger laceration Right 2008    2nd finger  . Other surgical history Left [id number]     Implanon  . Wisdom tooth extraction    . Mole removal    . Implanon insertion  06-26-11    left upper arm    Current Outpatient Prescriptions  Medication Sig Dispense Refill  . buPROPion (WELLBUTRIN XL) 150 MG 24 hr tablet Take 1 tab daily in am for 2 weeks and than 2 tab daily  60 tablet  0  . Etonogestrel (IMPLANON [place]) Inject into the skin.      Marland Kitchen ibuprofen (ADVIL,MOTRIN) 200 MG tablet Take 600 mg by mouth every 6 (six) hours as needed.      Marland Kitchen omeprazole (PRILOSEC) 40 MG capsule Take 1 capsule (40 mg total) by mouth daily.  30 capsule  3  . ranitidine (ZANTAC) 150 MG tablet Take 150 mg by mouth as needed for heartburn.       No current  facility-administered medications for this visit.    Family History  Problem Relation Age of Onset  . Atrial fibrillation Mother   . Diabetes Father   . Cancer Father     skin  . Polycystic ovary syndrome Sister   . Cancer Maternal Grandfather     skin    ROS:  Pertinent items are noted in HPI.  Otherwise, a comprehensive ROS was negative.  Exam:   BP 104/70  Pulse 68  Resp 16  Ht 5' 8.5" (1.74 m)  Wt 358 lb (162.388 kg)  BMI 53.64 kg/m2  LMP 02/17/2014 Height: 5' 8.5" (174 cm)  Ht Readings from Last 3 Encounters:  02/17/14 5' 8.5" (1.74 m)  01/26/14 5\' 8"  (1.727 m)  01/07/14 5\' 7"  (1.702 m)    General appearance: alert, cooperative and appears stated age Head: Normocephalic, without obvious abnormality, atraumatic Neck: no adenopathy, supple, symmetrical, trachea midline and thyroid normal to inspection and palpation Lungs: clear to auscultation bilaterally Breasts: normal appearance, no masses or tenderness, No nipple retraction or dimpling, No nipple discharge or bleeding, No axillary or supraclavicular adenopathy Heart: regular rate and rhythm Abdomen: soft, non-tender; no masses,  no organomegaly Extremities: extremities normal, atraumatic, no cyanosis or edema  Skin: Skin color, texture, turgor normal. No rashes or lesions Lymph nodes: Cervical, supraclavicular, and axillary nodes normal. No abnormal inguinal nodes palpated Neurologic: Grossly normal   Pelvic: External genitalia:  no lesions              Urethra:  normal appearing urethra with no masses, tenderness or lesions              Bartholin's and Skene's: normal                 Vagina: normal appearing vagina with normal color and discharge, no lesions              Cervix: normal,non tender, no lesions              Pap taken: Yes.   Bimanual Exam:  Uterus:  normal, non tender, but limited due to body habitus              Adnexa: no mass, fullness, tenderness and adnexal not palpable due to body  habitus               Rectovaginal: Confirms               Anus:  normal  appearance  A:  Well Woman with normal exam with limited pelvic exam due to morbid obesity  Contraception Implanon desires removal desires OCP again  Morbid Obesity  P:   Reviewed health and wellness pertinent to exam  Will schedule removal for patient. Can start on OCP at removal.  Discussed limited pelvic exam due to body habitus and need for PUS. Patient agreeable. Patient will be notified of insurance information and scheduled for PUS and Implanon removal.  Discussed options for weight management with surgical options, patient aware and not that point to make a decision if she would consider.  Pap smear not taken today   counseled on breast self exam, adequate intake of calcium and vitamin D, diet and exercise  return annually or prn  An After Visit Summary was printed and given to the patient.

## 2014-02-17 NOTE — Patient Instructions (Signed)
General topics  Next pap or exam is  due in 1 year Take a Women's multivitamin Take 1200 mg. of calcium daily - prefer dietary If any concerns in interim to call back  Breast Self-Awareness Practicing breast self-awareness may pick up problems early, prevent significant medical complications, and possibly save your life. By practicing breast self-awareness, you can become familiar with how your breasts look and feel and if your breasts are changing. This allows you to notice changes early. It can also offer you some reassurance that your breast health is good. One way to learn what is normal for your breasts and whether your breasts are changing is to do a breast self-exam. If you find a lump or something that was not present in the past, it is best to contact your caregiver right away. Other findings that should be evaluated by your caregiver include nipple discharge, especially if it is bloody; skin changes or reddening; areas where the skin seems to be pulled in (retracted); or new lumps and bumps. Breast pain is seldom associated with cancer (malignancy), but should also be evaluated by a caregiver. BREAST SELF-EXAM The best time to examine your breasts is 5 7 days after your menstrual period is over.  ExitCare Patient Information 2013 [employer].   Exercise to Stay Healthy Exercise helps you become and stay healthy. EXERCISE IDEAS AND TIPS Choose exercises that:  You enjoy.  Fit into your day. You do not need to exercise really hard to be healthy. You can do exercises at a slow or medium level and stay healthy. You can:  Stretch before and after working out.  Try yoga, Pilates, or tai chi.  Lift weights.  Walk fast, swim, jog, run, climb stairs, bicycle, dance, or rollerskate.  Take aerobic classes. Exercises that burn about 150 calories:  Running 1  miles in 15 minutes.  Playing volleyball for 45 to 60 minutes.  Washing and waxing a car for 45 to 60  minutes.  Playing touch football for 45 minutes.  Walking 1  miles in 35 minutes.  Pushing a stroller 1  miles in 30 minutes.  Playing basketball for 30 minutes.  Raking leaves for 30 minutes.  Bicycling 5 miles in 30 minutes.  Walking 2 miles in 30 minutes.  Dancing for 30 minutes.  Shoveling snow for 15 minutes.  Swimming laps for 20 minutes.  Walking up stairs for 15 minutes.  Bicycling 4 miles in 15 minutes.  Gardening for 30 to 45 minutes.  Jumping rope for 15 minutes.  Washing windows or floors for 45 to 60 minutes. Document Released: 05/26/2010 Document Revised: 07/16/2011 Document Reviewed: 05/26/2010 Exit[employer] Patient Information 2013 [employer].   Other topics ( that may be useful information):    Sexually Transmitted Disease Sexually transmitted disease (STD) refers to any infection that is passed from person to person during sexual activity. This may happen by way of saliva, semen, blood, vaginal mucus, or urine. Common STDs include:  Gonorrhea.  Chlamydia.  Syphilis.  HIV/AIDS.  Genital herpes.  Hepatitis B and C.  Trichomonas.  Human papillomavirus (HPV).  Pubic lice. CAUSES  An STD may be spread by bacteria, virus, or parasite. A person can get an STD by:  Sexual intercourse with an infected person.  Sharing sex toys with an infected person.  Sharing needles with an infected person.  Having intimate contact with the genitals, mouth, or rectal areas of an infected person. SYMPTOMS  Some people may not have any symptoms, but   they can still pass the infection to others. Different STDs have different symptoms. Symptoms include:  Painful or bloody urination.  Pain in the pelvis, abdomen, vagina, anus, throat, or eyes.  Skin rash, itching, irritation, growths, or sores (lesions). These usually occur in the genital or anal area.  Abnormal vaginal discharge.  Penile discharge in men.  Soft, flesh-colored skin growths in the  genital or anal area.  Fever.  Pain or bleeding during sexual intercourse.  Swollen glands in the groin area.  Yellow skin and eyes (jaundice). This is seen with hepatitis. DIAGNOSIS  To make a diagnosis, your caregiver may:  Take a medical history.  Perform a physical exam.  Take a specimen (culture) to be examined.  Examine a sample of discharge under a microscope.  Perform blood test TREATMENT   Chlamydia, gonorrhea, trichomonas, and syphilis can be cured with antibiotic medicine.  Genital herpes, hepatitis, and HIV can be treated, but not cured, with prescribed medicines. The medicines will lessen the symptoms.  Genital warts from HPV can be treated with medicine or by freezing, burning (electrocautery), or surgery. Warts may come back.  HPV is a virus and cannot be cured with medicine or surgery.However, abnormal areas may be followed very closely by your caregiver and may be removed from the cervix, vagina, or vulva through office procedures or surgery. If your diagnosis is confirmed, your recent sexual partners need treatment. This is true even if they are symptom-free or have a negative culture or evaluation. They should not have sex until their caregiver says it is okay. HOME CARE INSTRUCTIONS  All sexual partners should be informed, tested, and treated for all STDs.  Take your antibiotics as directed. Finish them even if you start to feel better.  Only take over-the-counter or prescription medicines for pain, discomfort, or fever as directed by your caregiver.  Rest.  Eat a balanced diet and drink enough fluids to keep your urine clear or pale yellow.  Do not have sex until treatment is completed and you have followed up with your caregiver. STDs should be checked after treatment.  Keep all follow-up appointments, Pap tests, and blood tests as directed by your caregiver.  Only use latex condoms and water-soluble lubricants during sexual activity. Do not use  petroleum jelly or oils.  Avoid alcohol and illegal drugs.  Get vaccinated for HPV and hepatitis. If you have not received these vaccines in the past, talk to your caregiver about whether one or both might be right for you.  Avoid risky sex practices that can break the skin. The only way to avoid getting an STD is to avoid all sexual activity.Latex condoms and dental dams (for oral sex) will help lessen the risk of getting an STD, but will not completely eliminate the risk. SEEK MEDICAL CARE IF:   You have a fever.  You have any new or worsening symptoms. Document Released: 07/14/2002 Document Revised: 07/16/2011 Document Reviewed: 07/21/2010 [facility] Patient Information 2013 [employer].    Domestic Abuse You are being battered or abused if someone close to you hits, pushes, or physically hurts you in any way. You also are being abused if you are forced into activities. You are being sexually abused if you are forced to have sexual contact of any kind. You are being emotionally abused if you are made to feel worthless or if you are constantly threatened. It is important to remember that help is available. No one has the right to abuse you. PREVENTION OF FURTHER  ABUSE  Learn the warning signs of danger. This varies with situations but may include: the use of alcohol, threats, isolation from friends and family, or forced sexual contact. Leave if you feel that violence is going to occur.  If you are attacked or beaten, report it to the police so the abuse is documented. You do not have to press charges. The police can protect you while you or the attackers are leaving. Get the officer's name and badge number and a copy of the report.  Find someone you can trust and tell them what is happening to you: your caregiver, a nurse, clergy member, close friend or family member. Feeling ashamed is natural, but remember that you have done nothing wrong. No one deserves abuse. Document Released:  04/20/2000 Document Revised: 07/16/2011 Document Reviewed: 06/29/2010 Exit[employer] Patient Information 2013 [employer].    How Much is Too Much Alcohol? Drinking too much alcohol can cause injury, accidents, and health problems. These types of problems can include:   Car crashes.  Falls.  Family fighting (domestic violence).  Drowning.  Fights.  Injuries.  Burns.  Damage to certain organs.  Having a baby with birth defects. ONE DRINK CAN BE TOO MUCH WHEN YOU ARE:  Working.  Pregnant or breastfeeding.  Taking medicines. Ask your doctor.  Driving or planning to drive. If you or someone you know has a drinking problem, get help from a doctor.  Document Released: 02/17/2009 Document Revised: 07/16/2011 Document Reviewed: 02/17/2009 ExitCare Patient Information 2013 [employer].   Smoking Hazards Smoking cigarettes is extremely bad for your health. Tobacco smoke has over 200 known poisons in it. There are over 60 chemicals in tobacco smoke that cause cancer. Some of the chemicals found in cigarette smoke include:   Cyanide.  Benzene.  Formaldehyde.  Methanol (wood alcohol).  Acetylene (fuel used in welding torches).  Ammonia. Cigarette smoke also contains the poisonous gases nitrogen oxide and carbon monoxide.  Cigarette smokers have an increased risk of many serious medical problems and Smoking causes approximately:  90% of all lung cancer deaths in men.  80% of all lung cancer deaths in women.  90% of deaths from chronic obstructive lung disease. Compared with nonsmokers, smoking increases the risk of:  Coronary heart disease by 2 to 4 times.  Stroke by 2 to 4 times.  Men developing lung cancer by 23 times.  Women developing lung cancer by 13 times.  Dying from chronic obstructive lung diseases by 12 times.  . Smoking is the most preventable cause of death and disease in our society.  WHY IS SMOKING ADDICTIVE?  Nicotine is the chemical  agent in tobacco that is capable of causing addiction or dependence.  When you smoke and inhale, nicotine is absorbed rapidly into the bloodstream through your lungs. Nicotine absorbed through the lungs is capable of creating a powerful addiction. Both inhaled and non-inhaled nicotine may be addictive.  Addiction studies of cigarettes and spit tobacco show that addiction to nicotine occurs mainly during the teen years, when young people begin using tobacco products. WHAT ARE THE BENEFITS OF QUITTING?  There are many health benefits to quitting smoking.   Likelihood of developing cancer and heart disease decreases. Health improvements are seen almost immediately.  Blood pressure, pulse rate, and breathing patterns start returning to normal soon after quitting. QUITTING SMOKING   American Lung Association - 1-800-LUNGUSA  American Cancer Society - 1-800-ACS-2345 Document Released: 05/31/2004 Document Revised: 07/16/2011 Document Reviewed: 02/02/2009 ExitCare Patient Information 2013 [employer].   Stress Management Stress is a state of physical or mental tension that often results from changes in your life or normal routine. Some common causes of stress are:  Death of a loved one.  Injuries or severe illnesses.  Getting fired or changing jobs.  Moving into a new home. Other causes may be:  Sexual problems.  Business or financial losses.  Taking on a large debt.  Regular conflict with someone at home or at work.  Constant tiredness from lack of sleep. It is not just bad things that are stressful. It may be stressful to:  Win the lottery.  Get married.  Buy a new car. The amount of stress that can be easily tolerated varies from person to person. Changes generally cause stress, regardless of the types of change. Too much stress can affect your health. It may lead to physical or emotional problems. Too little stress (boredom) may also become stressful. SUGGESTIONS TO  REDUCE STRESS:  Talk things over with your family and friends. It often is helpful to share your concerns and worries. If you feel your problem is serious, you may want to get help from a professional counselor.  Consider your problems one at a time instead of lumping them all together. Trying to take care of everything at once may seem impossible. List all the things you need to do and then start with the most important one. Set a goal to accomplish 2 or 3 things each day. If you expect to do too many in a single day you will naturally fail, causing you to feel even more stressed.  Do not use alcohol or drugs to relieve stress. Although you may feel better for a short time, they do not remove the problems that caused the stress. They can also be habit forming.  Exercise regularly - at least 3 times per week. Physical exercise can help to relieve that "uptight" feeling and will relax you.  The shortest distance between despair and hope is often a good night's sleep.  Go to bed and get up on time allowing yourself time for appointments without being rushed.  Take a short "time-out" period from any stressful situation that occurs during the day. Close your eyes and take some deep breaths. Starting with the muscles in your face, tense them, hold it for a few seconds, then relax. Repeat this with the muscles in your neck, shoulders, hand, stomach, back and legs.  Take good care of yourself. Eat a balanced diet and get plenty of rest.  Schedule time for having fun. Take a break from your daily routine to relax. HOME CARE INSTRUCTIONS   Call if you feel overwhelmed by your problems and feel you can no longer manage them on your own.  Return immediately if you feel like hurting yourself or someone else. Document Released: 10/17/2000 Document Revised: 07/16/2011 Document Reviewed: 06/09/2007 ExitCare Patient Information 2013 [employer].   

## 2014-02-19 ENCOUNTER — Telehealth: Payer: Self-pay | Admitting: Certified Nurse Midwife

## 2014-02-19 ENCOUNTER — Other Ambulatory Visit (HOSPITAL_COMMUNITY): Payer: Self-pay | Admitting: Psychiatry

## 2014-02-19 LAB — IPS PAP TEST WITH REFLEX TO HPV

## 2014-02-19 NOTE — Telephone Encounter (Signed)
Chart reviewed. Refill appropriate. Note sent to pharmacy that an appointment must be made before future refills can be given. Last seen 01/07/14 with no future appointment.

## 2014-02-19 NOTE — Telephone Encounter (Signed)
Left message for patient to call back. Need to go over benefits and schedule PUS °

## 2014-02-19 NOTE — Telephone Encounter (Signed)
Spoke with patient. Advised that per benefit quote received, she will be responsible to pay a $25 copay when she comes in for PUS. Patient agreeable. Prefers early morning appt Scheduled with Dr Quincy Simmonds. Advised patient of 72 hour cancellation policy and $485 cancellation fee. Patient agreeable.

## 2014-02-19 NOTE — Telephone Encounter (Signed)
Pt calling sabrina back

## 2014-02-25 ENCOUNTER — Telehealth: Payer: Self-pay | Admitting: Certified Nurse Midwife

## 2014-02-25 NOTE — Telephone Encounter (Signed)
Spoke with patient. Patient would like to schedule nexplanon removal at this time. Patient requesting Friday appointment. Appointment scheduled for Nov 6th at 2pm with Regina Eck CNM. Patient it agreeable to date and time.  Routing to provider for final review. Patient agreeable to disposition. Will close encounter

## 2014-02-25 NOTE — Telephone Encounter (Signed)
Patient says she is waiting to hear back from [facility] regarding scheduling an appointment.

## 2014-02-26 NOTE — Progress Notes (Signed)
Reviewed personally.  M. Suzanne Courtnay Petrilla, MD.  

## 2014-03-02 ENCOUNTER — Telehealth (HOSPITAL_COMMUNITY): Payer: Self-pay

## 2014-03-03 NOTE — Telephone Encounter (Signed)
I returned patient's phone call at her home and also tried to call at her office phone number.  She was not available.  I left a message at her home phone number.

## 2014-03-04 ENCOUNTER — Ambulatory Visit: Payer: Self-pay | Admitting: Dietician

## 2014-03-05 ENCOUNTER — Ambulatory Visit (INDEPENDENT_AMBULATORY_CARE_PROVIDER_SITE_OTHER): Payer: 59 | Admitting: Psychiatry

## 2014-03-05 ENCOUNTER — Encounter (HOSPITAL_COMMUNITY): Payer: Self-pay | Admitting: Psychiatry

## 2014-03-05 VITALS — BP 149/80 | HR 98 | Wt 363.0 lb

## 2014-03-05 DIAGNOSIS — F419 Anxiety disorder, unspecified: Secondary | ICD-10-CM

## 2014-03-05 DIAGNOSIS — F329 Major depressive disorder, single episode, unspecified: Secondary | ICD-10-CM

## 2014-03-05 DIAGNOSIS — F411 Generalized anxiety disorder: Secondary | ICD-10-CM

## 2014-03-05 MED ORDER — DULOXETINE HCL 30 MG PO CPEP: ORAL_CAPSULE | ORAL | Status: DC

## 2014-03-05 NOTE — Progress Notes (Signed)
Mary Brandt [phone] Progress Note  Charnice Zwilling [id number] 28 y.o.  03/05/2014 8:47 AM  Chief Complaint:  I am not feeling well.  I have a lot of irritability, anxiety and depression.  My mood is up and down .      History of Present Illness:  Mehar came for her followup appointment.  On her last visit she requested to try Wellbutrin because she was concerned about the weight gain which she believed coming from Cymbalta.  We had a long discussion that Cymbalta may not be contributing to her weight because she has a family history of obesity and she had tried higher dose of Cymbalta in the past and her weight was unchanged.  However per her request we started Cymbalta taper and started Wellbutrin.  She is not taking Cymbalta at this time.  She is taking Wellbutrin 300 mg daily.  She is experiencing increased anxiety, nervousness, irritability with lack of energy and mood swings.  She also endorses increased stressful life.  She has more duties her job and she has been very busy and sometimes unable to finish the job.  She is working as Glass blower/designer at [employer]  She is hoping to get some help week.  She's also thinking to take Bar exam next July.  Recently she moved in with an loss and that has been stressful.  She admitted not able to watch her calorie intake or doing any exercises.  She has gained another 5 pounds since the last visit.  She is very concerned about her weight gain.  She spoke to her primary care physician and she had appointment with a nutritional consult but she has to reschedule because of her busy schedule.  She is taking Wellbutrin for more than 4 weeks and she has not seen any weight loss and she is more anxious and depressed.  She denies any hallucination, paranoia, active or passive suicidal thoughts.  She endorsed some time crying spells but denies any aggression or violence.  She is not drinking or using any illegal substances.  She recently moved in with  her in-laws.  Her husband is very supportive.  She has no children.   Patient recently had visited to her primary care physician she has blood work.  Her hemoglobin A1c is normal.  Her vitals are stable however she has gained weight from the past.  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  She endorsed symptoms of social isolation, lack of energy, irritability, panic attack, excessive anxiety , withdrawn and lack of interest in her daily activities.  [facility] until December 2014 she stopped.  She had significant withdrawal symptoms when she stops Cymbalta.   Anxiety: Yes Bipolar Disorder: No Depression: Yes Mania: No Psychosis: No Schizophrenia: No Personality Disorder: No Hospitalization for psychiatric illness: No History of Electroconvulsive Shock Therapy: No Prior Suicide Attempts: No  Medical History; Patient has morbid obesity.  She see family nurse practitioner at [employer].   Review of Systems  Constitutional: Positive for malaise/fatigue.       Weight gain  Musculoskeletal: Negative.   Psychiatric/Behavioral: Positive for depression. The patient is nervous/anxious and has insomnia.  Psychiatric: Agitation: No Hallucination: No Depressed Mood: Yes Insomnia: Yes Hypersomnia: No Altered Concentration: No Feels Worthless: Yes Grandiose Ideas: No Belief In Special Powers: No New/Increased Substance Abuse: No Compulsions: No  Neurologic: Headache: No Seizure: No Paresthesias: No   Outpatient Encounter Prescriptions as of 03/05/2014  Medication Sig  . Etonogestrel (IMPLANON [place]) Inject into the skin.  . ranitidine (ZANTAC) 150 MG tablet Take  150 mg by mouth as needed for heartburn.  . [DISCONTINUED] buPROPion (WELLBUTRIN XL) 150 MG 24 hr tablet TAKE 1 TABLET BY MOUTH IN THE MORNING FOR 2 WEEKS, THEN 2 TABLETS DAILY  . DULoxetine (CYMBALTA) 30 MG capsule Take one capsule for 1 week and than 2 daily  . ibuprofen (ADVIL,MOTRIN) 200 MG tablet Take 600 mg by mouth every 6 (six) hours as needed.  Marland Kitchen omeprazole (PRILOSEC) 40 MG capsule Take 1 capsule (40 mg total) by mouth daily.    Recent Results (from the past 2160 hour(s))  HEMOGLOBIN A1C     Status: None   Collection Time    01/26/14  8:56 AM      Result Value Ref Range   Hemoglobin A1C 5.9  4.6 - 6.5 %   Comment: Glycemic Control Guidelines for People with Diabetes:Non Diabetic:  <6%Goal of Therapy: <7%Additional Action Suggested:  >8%   COMPREHENSIVE METABOLIC PANEL     Status: None   Collection Time    01/26/14  8:56 AM      Result Value Ref Range   Sodium 136  135 - 145 mEq/L   Potassium 3.7  3.5 - 5.1 mEq/L   Chloride 104  96 - 112 mEq/L   CO2 26  19 - 32 mEq/L   Glucose, Bld 89  70 - 99 mg/dL   BUN 14  6 - 23 mg/dL   Creatinine, Ser 0.9  0.4 - 1.2 mg/dL   Total Bilirubin 0.4  0.2 - 1.2 mg/dL   Alkaline Phosphatase 94  39 - 117 U/L   AST 17  0 - 37 U/L   ALT 21  0 - 35 U/L   Total Protein 7.8  6.0 - 8.3 g/dL   Albumin 3.6  3.5 - 5.2 g/dL   Calcium 8.7  8.4 - 10.5 mg/dL   GFR 75.68  >60.00 mL/min  POCT URINALYSIS DIPSTICK     Status: None   Collection Time    02/17/14  9:15 AM      Result Value Ref Range   Color, UA yellow     Clarity, UA clear     Glucose, UA n     Bilirubin, UA n     Ketones, UA n     Spec Grav, UA       Blood, UA 1+     Comment: having spotting   pH, UA 5.0     Protein, UA n     Urobilinogen, UA negative     Nitrite, UA n     Leukocytes, UA Negative    IPS PAP TEST WITH REFLEX TO HPV     Status: None   Collection Time    02/18/14  8:46 AM      Result Value Ref Range   COMMENTS: [facility]     Comment: [address], [place] [address]     [place] [address]     GYN CYTOLOGY REPORT      PATIENT NAME:Vonruden, Sharmin PATHOLOGY#:[id number]: F DOB: 1986-02-21 (Age: 40)  MEDICAL RECORD OHYWVP:[id number] DOCTOR:Debbie Hollice Espy, CNM DATE OBTAINED:10/15/2015CLIENT:[facility] DATE RECEIVED:10/16/2015OTHER PHYS: DATE SIGNED:02/19/2014     PAP Thinlayer with Reflex to HPV     Final Cytologic Interpretation:           Cervical, ThinLayer with Automated Imaging and Dual Review, CPT 88175          Negative for Intraepithelial Lesions or Malignancy.               ADEQUACY OF SPECIMEN:               Satisfactory for evaluation. Endocervical cells/transformation zone component identified.                     Electronically Signed Out Byndh/10/16/2015[facility], CT(ASCP)[address], [place], [place]. Dir.: Darren Vernona Rieger Pap test is a screening mechanism with excellent but not perfect ability to prevent cervical carcinoma.  It has a low, but      significant, diagnostic error rate. The pap test is suboptimal  for detection of glandular lesions.  It should be noted that a negative result does not definitively rule out the presence of disease.Ref: DeMay, RM, The Art and Science of Cytopathology,      [employer], [phone].     Last Menstrual Period: 02/17/2014       Physical Exam: Constitutional:  BP 149/80  Pulse 98  Wt 363 lb (164.656 kg)  LMP 02/17/2014  Musculoskeletal: Strength & Muscle Tone: within normal limits Gait & Station: normal Patient leans: N/A  Mental Status Examination;  Patient is casually dressed and fairly groomed.  She is anxious but cooperative.  She maintained good eye contact.  Her speech is clear and coherent.  Her thought process is logical and goal-directed.  She describes her mood anxious and depressed.  Her affect is constricted.  She denies any auditory or visual hallucination.  There were no paranoia, delusions obsession present  at this time.  Her fund of knowledge is adequate.  She has no tremors or shakes.  She denies any active or passive suicidal thoughts or homicidal thoughts.  There were no flight of ideas or any loose association.  She is alert and oriented x3.  Her insight judgment and impulse control is okay.   Established Problem, Stable/Improving (1), Review of Psycho-Social Stressors (1), Review or order clinical lab tests (1), Established Problem, Worsening (2), Review of Last Therapy Session (1), Review of Medication Regimen & Side Effects (2) and Review of New Medication or Change in Dosage (2)  Assessment: Axis I: Anxiety disorder NOS, depressive disorder NOS  Axis II: Deferred  Axis III:  Past Medical History  Diagnosis Date  . Asthma   . Anxiety   . Asthma   . Arthritis   . Morbid obesity     Axis IV: Mild to moderate   Plan:  I talked with the patient about stopping Wellbutrin and go back on Cymbalta 60 mg.  She did not lost any weight on Wellbutrin.  Her symptoms getting worse.  Patient agreed.  I will discontinue Wellbutrin and restart Cymbalta 30 mg for 1 week and then gradually 60 mg.  I encouraged her to keep appointment with nutritional consult and talk to her primary care physician about weight loss program.  Also encouraged to do exercise and watch her calorie intake.  I offered counseling but patient declined at this time due to busy schedule.  I also reviewed blood work results.  Her hemoglobin A1c is normal.  Recommended to call us back if she has any question or any concern.  Follow-up in 6 weeks. More than 50% of the time spent in psychoeducation, counseling and coordination of care.  Discuss safety plan that anytime having active suicidal thoughts or homicidal thoughts then patient need to call 911 or go to the local emergency room.    Yocheved Depner T., MD 03/05/2014

## 2014-03-09 ENCOUNTER — Ambulatory Visit (HOSPITAL_COMMUNITY): Payer: Self-pay | Admitting: Psychiatry

## 2014-03-12 ENCOUNTER — Ambulatory Visit (INDEPENDENT_AMBULATORY_CARE_PROVIDER_SITE_OTHER): Payer: 59 | Admitting: Certified Nurse Midwife

## 2014-03-12 ENCOUNTER — Encounter: Payer: Self-pay | Admitting: Certified Nurse Midwife

## 2014-03-12 VITALS — BP 110/74 | HR 70 | Resp 16 | Ht 68.5 in | Wt 358.0 lb

## 2014-03-12 DIAGNOSIS — Z308 Encounter for other contraceptive management: Secondary | ICD-10-CM

## 2014-03-12 DIAGNOSIS — Z309 Encounter for contraceptive management, unspecified: Secondary | ICD-10-CM

## 2014-03-12 DIAGNOSIS — Z3046 Encounter for surveillance of implantable subdermal contraceptive: Secondary | ICD-10-CM

## 2014-03-12 LAB — POCT URINE PREGNANCY: Preg Test, Ur: NEGATIVE

## 2014-03-12 MED ORDER — NORETHINDRONE ACET-ETHINYL EST 1-20 MG-MCG PO TABS: 1.0000 | ORAL_TABLET | Freq: Every day | ORAL | Status: DC

## 2014-03-12 NOTE — Progress Notes (Signed)
72 yrsCaucasian Married [id number]  presents for Implanon removal.   LMP10/14/15. Patient aware removal not due until 2/16. Patient is tired of irregular bleeding with use.Contraception  oral contraceptives planned. Questions addressed.  Consent signed. Requests same as above.  HPI neg.  Exam:Healthy female obese Normal affect  Procedure: Patient placed supine on exam table with her  left arm   flexed at the elbow and externally rotated.The insertion site was identified on the inner side of upper arm.The device was palpated. Incision site for removal was marked. The removal site was cleansed with betadine solution and 1 ml of 1% Lidocaine was injected at the incision site. A small 2 mm incision was made at the distal end of the implant.The Implanon implant was grasped with curved forceps and removed gently intact.The implant was shown to the patient and discarded. The incision was closed with a bandage, allergic to steri strip.  No active bleeding was noted. A small non adhesive bandage placed over the removal site.  A pressure bandage was place over the site. Arm was wrapped due to allergy to adhesive.  Assessment:  Implanon Removal Pt tolerated procedure well. Planned contraception:The current method of family planning is OCP (estrogen/progesterone).  Plan :Instructions and warning signs and symptoms given.  Remove bandage in 3 days. Questions addressed Rx Junel 1/20 Fe   RV prn, aex

## 2014-03-12 NOTE — Patient Instructions (Signed)

## 2014-03-15 ENCOUNTER — Encounter: Payer: Self-pay | Admitting: Certified Nurse Midwife

## 2014-03-16 NOTE — Addendum Note (Signed)
Addended by: Michele Mcalpine on: 03/16/2014 01:53 PM   Modules accepted: Orders

## 2014-03-16 NOTE — Progress Notes (Signed)
Reviewed personally.  M. Suzanne Dusty Wagoner, MD.  

## 2014-03-18 ENCOUNTER — Ambulatory Visit (INDEPENDENT_AMBULATORY_CARE_PROVIDER_SITE_OTHER): Payer: 59

## 2014-03-18 ENCOUNTER — Ambulatory Visit (INDEPENDENT_AMBULATORY_CARE_PROVIDER_SITE_OTHER): Payer: 59 | Admitting: Obstetrics and Gynecology

## 2014-03-18 ENCOUNTER — Encounter: Payer: Self-pay | Admitting: Obstetrics and Gynecology

## 2014-03-18 VITALS — BP 108/70 | HR 80 | Ht 68.5 in | Wt 359.0 lb

## 2014-03-18 DIAGNOSIS — N926 Irregular menstruation, unspecified: Secondary | ICD-10-CM

## 2014-03-18 NOTE — Patient Instructions (Signed)
Call if your cycles do not regulate after you start your birth control pills.  It may take 2 -3 months to completely adjust to them.

## 2014-03-18 NOTE — Progress Notes (Signed)
Subjective  Patient is here for pelvic ultrasound for definition of anatomy.  Had irregular bleeding and Implanon removed on 03/12/14.  Will start LoEstrin with her next menses.  Previously took OCPs and had a hard time remembering them.  Feels she will do fine this time.  Objective.   See ultrasound - report and images reviewed with patient.  Uterus normal with EMS 5.15 mm. Right ov follicle 16 mm.  Left ovary normal. No free fluid.    Assessment  Irregular menses on Nexplanon.  Normal anatomy.   Plan   Start combined oral contraceptive with next cycle.  Return if irregular menses persist after 2 - 3 months on pills.  Discussed OrthoEvra or NuvaRing if has difficulty remembering OCPs.   15 minutes face to face time of which over 50% was spent in counseling.   After visit summary to patient.

## 2014-04-16 ENCOUNTER — Ambulatory Visit (INDEPENDENT_AMBULATORY_CARE_PROVIDER_SITE_OTHER): Payer: 59 | Admitting: Psychiatry

## 2014-04-16 ENCOUNTER — Encounter (HOSPITAL_COMMUNITY): Payer: Self-pay | Admitting: Psychiatry

## 2014-04-16 VITALS — BP 139/92 | HR 89 | Ht 68.0 in | Wt 361.8 lb

## 2014-04-16 DIAGNOSIS — F419 Anxiety disorder, unspecified: Secondary | ICD-10-CM

## 2014-04-16 DIAGNOSIS — F411 Generalized anxiety disorder: Secondary | ICD-10-CM

## 2014-04-16 DIAGNOSIS — F329 Major depressive disorder, single episode, unspecified: Secondary | ICD-10-CM

## 2014-04-16 MED ORDER — DULOXETINE HCL 60 MG PO CPEP: 60.0000 mg | ORAL_CAPSULE | Freq: Every day | ORAL | Status: DC

## 2014-04-16 NOTE — Progress Notes (Signed)
[facility] [phone] Progress Note  Mary Brandt [id number] 28 y.o.  04/16/2014 8:53 AM  Chief Complaint:  I am feeling better with Cymbalta..      History of Present Illness:  Mary Brandt came for her followup appointment.   on her last visit we switch from Wellbutrin to Cymbalta because she did not like the Wellbutrin.  She is taking Cymbalta 60 mg and she is feeling much better.  She is less anxious less irritable and less depressed.  Her sleep is good.  She had a good Thanksgiving.  She is happy living with an loss .  Her father-in-law is very supportive.  Patient denies any agitation, anger or any crying spells.  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  Patient denies drinking or using any illegal substances.  She has not consulted nutritional consult yet but planning to do so in near future.  Overall her appetite is okay and her weight has not changed significantly.  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  She endorsed symptoms of social isolation, lack of energy, irritability, panic attack, excessive anxiety , withdrawn and lack of interest in her daily activities.  [facility] until December 2014 she stopped.  She had significant withdrawal symptoms when she stops Cymbalta.  We have tried Wellbutrin however it did not work and her symptoms get worse.   Anxiety: Yes Bipolar Disorder: No Depression: Yes Mania: No Psychosis: No Schizophrenia: No Personality Disorder: No Hospitalization for psychiatric illness: No History of  Electroconvulsive Shock Therapy: No Prior Suicide Attempts: No  Medical History; Patient has morbid obesity.  She see family nurse practitioner at [employer].   ROS   Psychiatric: Agitation: No Hallucination: No Depressed Mood: No Insomnia: No Hypersomnia: No Altered Concentration: No Feels Worthless: No Grandiose Ideas: No Belief In Special Powers: No New/Increased Substance Abuse: No Compulsions: No  Neurologic: Headache: No Seizure: No Paresthesias: No   Outpatient Encounter Prescriptions as of 04/16/2014  Medication Sig  . DULoxetine (CYMBALTA) 60 MG capsule Take 1 capsule (60 mg total) by mouth daily.  Marland Kitchen ibuprofen (ADVIL,MOTRIN) 200 MG tablet Take 600 mg by mouth every 6 (six) hours as needed.  . norethindrone-ethinyl estradiol (MICROGESTIN,JUNEL,LOESTRIN) 1-20 MG-MCG tablet Take 1 tablet by mouth daily.  Marland Kitchen omeprazole (PRILOSEC) 40 MG capsule Take 1 capsule (40 mg total) by mouth daily.  . ranitidine (ZANTAC) 150 MG tablet Take 150 mg by mouth as needed for heartburn.  . [DISCONTINUED] DULoxetine (CYMBALTA) 30 MG capsule Take one capsule for 1 week and than 2 daily    Physical Exam: Constitutional:  BP 139/92 mmHg  Pulse 89  Ht 5\' 8"  (1.727 m)  Wt 361 lb 12.8 oz (164.111 kg)  BMI 55.02 kg/m2  Musculoskeletal: Strength & Muscle Tone: within normal limits Gait & Station: normal Patient leans: N/A  Mental Status Examination;  Patient is casually dressed and fairly groomed. She is pleasant and cooperative.  She maintained good eye contact.  Her speech is clear and coherent.  Her thought process is logical  and goal-directed.  She describes her mood  euthymic and her affect is appropriate.  She denies any auditory or visual hallucination.  There were no paranoia, delusions obsession present at this time.  Her fund of knowledge is adequate.  She has no tremors or shakes.  She denies any active or passive suicidal thoughts or homicidal thoughts.  There were no flight of  ideas or any loose association.  She is alert and oriented x3.  Her insight judgment and impulse control is okay.   Established Problem, Stable/Improving (1), Review of Last Therapy Session (1) and Review of Medication Regimen & Side Effects (2)  Assessment: Axis I: Anxiety disorder NOS, depressive disorder NOS  Axis II: Deferred  Axis III:  Past Medical History  Diagnosis Date  . Asthma   . Anxiety   . Asthma   . Arthritis   . Morbid obesity     Axis IV: Mild to moderate   Plan:  Patient is doing better on Cymbalta 60 mg.  She does not have any side effects including any tremors or shakes.  She will contact nutritional consult for her weight .  I will continue Cymbalta 60 mg daily.  I offer counseling but patient has a busy schedule and at this time she is not interested.  Recommended to call us back if she has any question, concern or if she feels worsening of symptoms.  I will see her again in 3 months.    Saanvika Vazques T., MD 04/16/2014

## 2014-04-24 ENCOUNTER — Emergency Department (HOSPITAL_BASED_OUTPATIENT_CLINIC_OR_DEPARTMENT_OTHER): Payer: 59

## 2014-04-24 ENCOUNTER — Encounter (HOSPITAL_BASED_OUTPATIENT_CLINIC_OR_DEPARTMENT_OTHER): Payer: Self-pay | Admitting: *Deleted

## 2014-04-24 ENCOUNTER — Emergency Department (HOSPITAL_BASED_OUTPATIENT_CLINIC_OR_DEPARTMENT_OTHER)
Admission: EM | Admit: 2014-04-24 | Discharge: 2014-04-24 | Disposition: A | Discharge status: Home / Self Care | Payer: 59 | Attending: Emergency Medicine | Admitting: Emergency Medicine

## 2014-04-24 DIAGNOSIS — Z79899 Other long term (current) drug therapy: Secondary | ICD-10-CM | POA: Diagnosis not present

## 2014-04-24 DIAGNOSIS — Y998 Other external cause status: Secondary | ICD-10-CM | POA: Insufficient documentation

## 2014-04-24 DIAGNOSIS — S99911A Unspecified injury of right ankle, initial encounter: Secondary | ICD-10-CM | POA: Diagnosis present

## 2014-04-24 DIAGNOSIS — Y9289 Other specified places as the place of occurrence of the external cause: Secondary | ICD-10-CM | POA: Diagnosis not present

## 2014-04-24 DIAGNOSIS — X58XXXA Exposure to other specified factors, initial encounter: Secondary | ICD-10-CM | POA: Diagnosis not present

## 2014-04-24 DIAGNOSIS — W19XXXA Unspecified fall, initial encounter: Secondary | ICD-10-CM

## 2014-04-24 DIAGNOSIS — S93401A Sprain of unspecified ligament of right ankle, initial encounter: Secondary | ICD-10-CM | POA: Diagnosis not present

## 2014-04-24 DIAGNOSIS — J45909 Unspecified asthma, uncomplicated: Secondary | ICD-10-CM | POA: Diagnosis not present

## 2014-04-24 DIAGNOSIS — F419 Anxiety disorder, unspecified: Secondary | ICD-10-CM | POA: Diagnosis not present

## 2014-04-24 DIAGNOSIS — Y9389 Activity, other specified: Secondary | ICD-10-CM | POA: Diagnosis not present

## 2014-04-24 MED ORDER — IBUPROFEN 800 MG PO TABS
800.0000 mg | ORAL_TABLET | Freq: Once | ORAL | Status: AC
  Administered 2014-04-24: 800 mg via ORAL
  Filled 2014-04-24: qty 1

## 2014-04-24 MED ORDER — IBUPROFEN 200 MG PO TABS: 600.0000 mg | ORAL_TABLET | Freq: Four times a day (QID) | ORAL | Status: DC | PRN

## 2014-04-24 NOTE — ED Provider Notes (Signed)
CSN: [id number]     Arrival date & time 04/24/14  0962 History   First MD Initiated Contact with Patient 04/24/14 1944     Chief Complaint  Patient presents with  . Ankle Injury     (Consider location/radiation/quality/duration/timing/severity/associated sxs/prior Treatment) HPI   Morbidly obese 28 year old female presents for evaluation of right ankle injury. Patient states approximately one hour ago she was walking outside of her house, down the steps she actually twisted her right ankle. She reported experiencing acute onset of sharp pain to the ankle that is nonradiating, 7 out of 10. She was able to walk back to the house and sat down on the chair. States no increasing pain with ambulation. However afterward she did notice worsening throbbing pain. She denies any knee or hip pain. Denies any precipitating symptoms prior to injuring her ankle. She denies any prior injury to the same ankle. She did try to his elevated her ankle prior to arrival. She denies any numbness.  Past Medical History  Diagnosis Date  . Asthma   . Anxiety   . Asthma   . Morbid obesity    Past Surgical History  Procedure Laterality Date  . Finger laceration Right 2008    2nd finger  . Other surgical history Left [id number]     Implanon  . Wisdom tooth extraction    . Mole removal    . Implanon insertion  06-26-11    left upper arm   Family History  Problem Relation Age of Onset  . Atrial fibrillation Mother   . Diabetes Father   . Cancer Father     skin  . Polycystic ovary syndrome Sister   . Cancer Maternal Grandfather     skin   History  Substance Use Topics  . Smoking status: Never Smoker   . Smokeless tobacco: Not on file  . Alcohol Use: No   OB History    Gravida Para Term Preterm AB TAB SAB Ectopic Multiple Living   0 0 0 0 0 0 0 0 0 0      Review of Systems  Constitutional: Negative for fever.  Musculoskeletal: Positive for joint swelling and arthralgias.  Skin: Negative for rash  and wound.  Neurological: Negative for numbness.      Allergies  Review of patient's allergies indicates no known allergies.  Home Medications   Prior to Admission medications   Medication Sig Start Date End Date Taking? Authorizing Provider  DULoxetine (CYMBALTA) 60 MG capsule Take 1 capsule (60 mg total) by mouth daily. 04/16/14   Kathlee Nations, MD  ibuprofen (ADVIL,MOTRIN) 200 MG tablet Take 600 mg by mouth every 6 (six) hours as needed.    Historical Provider, MD  norethindrone-ethinyl estradiol (MICROGESTIN,JUNEL,LOESTRIN) 1-20 MG-MCG tablet Take 1 tablet by mouth daily. 03/12/14   Regina Eck, CNM  omeprazole (PRILOSEC) 40 MG capsule Take 1 capsule (40 mg total) by mouth daily. 01/26/14   Timoteo Gaul, FNP  ranitidine (ZANTAC) 150 MG tablet Take 150 mg by mouth as needed for heartburn.    Historical Provider, MD   BP 127/87 mmHg  Pulse 79  Temp(Src) 98.2 F (36.8 C) (Oral)  Resp 18  Ht 5\' 8"  (1.727 m)  Wt 350 lb (158.759 kg)  BMI 53.23 kg/m2  SpO2 98%  LMP 04/10/2014 Physical Exam  Constitutional: She appears well-developed and well-nourished. No distress.  Moderately obese Caucasian female appears to be in no acute distress.  HENT:  Head: Atraumatic.  Eyes: Conjunctivae are  normal.  Neck: Neck supple.  Musculoskeletal: She exhibits tenderness (right ankle. Point tenderness to lateral malleolus region with mild swelling noted no bruising. Increasing pain with foot eversion and inversion, normal dorsi and plantar flexion. Pedal pulse palpable, brisk cap refills and no gross deformity.).  Neurological: She is alert.  Skin: No rash noted.  Psychiatric: She has a normal mood and affect.  Nursing note and vitals reviewed.   ED Course  Procedures (including critical care time)  8:05 PM Patient presents with right ankle injury, suspect an ankle sprain and low suspicion for ankle fracture. Is placed for comfort, ibuprofen given to for pain, x-ray ordered. She is  neurovascularly intact.  Labs Review Labs Reviewed - No data to display  Imaging Review Dg Ankle Complete Right  04/24/2014   CLINICAL DATA:  Twisted ankle.  Pain and swelling.  EXAM: RIGHT ANKLE - COMPLETE 3+ VIEW  COMPARISON:  None.  FINDINGS: There is no evidence of fracture, dislocation, or joint effusion. There is no evidence of arthropathy or other focal bone abnormality. There is marked soft tissue swelling. Ankle mortise is grossly intact.  IMPRESSION: Negative for fracture.   Electronically Signed   By: Rolla Flatten M.D.   On: 04/24/2014 20:24     EKG Interpretation None      MDM   Final diagnoses:  Right ankle sprain, initial encounter    BP 127/87 mmHg  Pulse 79  Temp(Src) 98.2 F (36.8 C) (Oral)  Resp 18  Ht 5\' 8"  (1.727 m)  Wt 350 lb (158.759 kg)  BMI 53.23 kg/m2  SpO2 98%  LMP 04/10/2014  I have reviewed nursing notes and vital signs. I personally reviewed the imaging tests through PACS system  I reviewed available ER/hospitalization records thought the EMR     Domenic Moras, PA-C 04/24/14 2354  Ernestina Patches, MD 04/25/14 0003

## 2014-04-24 NOTE — ED Notes (Signed)
Pt reports that she rolled her (R) ankle about 45 minutes PTA.  Swelling noted.

## 2014-04-24 NOTE — Discharge Instructions (Signed)

## 2014-07-16 ENCOUNTER — Encounter (HOSPITAL_COMMUNITY): Payer: Self-pay | Admitting: Psychiatry

## 2014-07-16 ENCOUNTER — Ambulatory Visit (INDEPENDENT_AMBULATORY_CARE_PROVIDER_SITE_OTHER): Payer: 59 | Admitting: Psychiatry

## 2014-07-16 VITALS — BP 122/87 | HR 86 | Ht 68.0 in | Wt 366.0 lb

## 2014-07-16 DIAGNOSIS — F411 Generalized anxiety disorder: Secondary | ICD-10-CM

## 2014-07-16 DIAGNOSIS — F329 Major depressive disorder, single episode, unspecified: Secondary | ICD-10-CM

## 2014-07-16 MED ORDER — DULOXETINE HCL 60 MG PO CPEP: 60.0000 mg | ORAL_CAPSULE | Freq: Every day | ORAL | Status: DC

## 2014-07-16 NOTE — Progress Notes (Signed)
[facility] [phone] Progress Note  MCCARTNEY BRUCKS [id number] 29 y.o.  07/16/2014 10:25 AM  Chief Complaint:  Medication management and follow-up.    History of Present Illness:  Mary Brandt came for her followup appointment.  She is compliant with the medication and denies any side effects.  She like Cymbalta but is helping her anxiety depression very well.  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  She is planning to take time off before the exams.  She denies any irritability, anger or any mood swing.  Her sleep is good.  She denies any feeling of hopelessness or worthlessness.  She continues to work as Glass blower/designer at [employer] and admitted some time job is stressful.  She is living with her in-laws were very supportive.  Her energy level is good.  Patient denies drinking or using any illegal substances.  Her appetite is okay.  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  She endorsed symptoms of social isolation, lack of energy, irritability, panic attack, excessive anxiety , withdrawn and lack of interest in her daily activities.  [facility] until December 2014 she stopped.  She had significant withdrawal symptoms when she stops Cymbalta.  We have tried Wellbutrin however it did not work and her symptoms get worse.   Anxiety: Yes Bipolar Disorder: No Depression: Yes Mania: No Psychosis: No Schizophrenia: No Personality Disorder: No Hospitalization for psychiatric illness: No History of Electroconvulsive Shock Therapy: No Prior Suicide Attempts: No  Medical  History; Patient has morbid obesity.  She see family nurse practitioner at [facility].   ROS   Psychiatric: Agitation: No Hallucination: No Depressed Mood: No Insomnia: No Hypersomnia: No Altered Concentration: No Feels Worthless: No Grandiose Ideas: No Belief In Special Powers: No New/Increased Substance Abuse: No Compulsions: No  Neurologic: Headache: No Seizure: No Paresthesias: No   Outpatient Encounter Prescriptions as of 07/16/2014  Medication Sig  . DULoxetine (CYMBALTA) 60 MG capsule Take 1 capsule (60 mg total) by mouth daily.  Marland Kitchen ibuprofen (ADVIL,MOTRIN) 200 MG tablet Take 3 tablets (600 mg total) by mouth every 6 (six) hours as needed for moderate pain.  Marland Kitchen norethindrone-ethinyl estradiol (MICROGESTIN,JUNEL,LOESTRIN) 1-20 MG-MCG tablet Take 1 tablet by mouth daily.  Marland Kitchen omeprazole (PRILOSEC) 40 MG capsule Take 1 capsule (40 mg total) by mouth daily.  . [DISCONTINUED] DULoxetine (CYMBALTA) 60 MG capsule Take 1 capsule (60 mg total) by mouth daily.  . [DISCONTINUED] ranitidine (ZANTAC) 150 MG tablet Take 150 mg by mouth as needed for heartburn.    Physical Exam: Constitutional:  BP 122/87 mmHg  Pulse 86  Ht 5\' 8"  (1.727 m)  Wt 366 lb (166.017 kg)  BMI 55.66 kg/m2  LMP 07/03/2014 (Approximate)  Musculoskeletal: Strength & Muscle Tone: within normal limits Gait & Station: normal Patient leans: N/A  Mental Status Examination;  Patient is casually dressed and fairly groomed. She is pleasant and cooperative.  She maintained good eye contact.  Her speech is clear and coherent.  Her thought process is logical and goal-directed.  She describes her mood  euthymic and her  affect is appropriate.  She denies any auditory or visual hallucination.  There were no paranoia, delusions obsession present at this time.  Her fund of knowledge is adequate.  She has no tremors or shakes.  She denies any active or passive suicidal thoughts or homicidal thoughts.  There were no flight of  ideas or any loose association.  She is alert and oriented x3.  Her insight judgment and impulse control is okay.   Established Problem, Stable/Improving (1), Review of Last Therapy Session (1) and Review of Medication Regimen & Side Effects (2)  Assessment: Axis I: Anxiety disorder NOS, depressive disorder NOS  Axis II: Deferred  Axis III:  Past Medical History  Diagnosis Date  . Asthma   . Anxiety   . Asthma   . Morbid obesity    Plan:  Patient is doing better on Cymbalta 60 mg.  She does not have any side effects including any tremors or shakes.  I discuss weight gain and she promises to work on weight loss.  Patient is not interested in counseling at this time.  I will continue Cymbalta 60 mg daily.  Recommended to call us back if she has any question, concern or if she feels worsening of symptoms.  I will see her again in 3 months.    Haylea Schlichting T., MD 07/16/2014

## 2014-07-29 ENCOUNTER — Ambulatory Visit: Payer: Self-pay | Admitting: Family

## 2014-08-06 ENCOUNTER — Encounter: Payer: Self-pay | Admitting: Family Medicine

## 2014-08-06 ENCOUNTER — Ambulatory Visit (INDEPENDENT_AMBULATORY_CARE_PROVIDER_SITE_OTHER): Payer: 59 | Admitting: Family Medicine

## 2014-08-06 VITALS — BP 102/68 | HR 92 | Temp 98.3°F | Ht 68.0 in | Wt 368.0 lb

## 2014-08-06 DIAGNOSIS — K219 Gastro-esophageal reflux disease without esophagitis: Secondary | ICD-10-CM | POA: Diagnosis not present

## 2014-08-06 DIAGNOSIS — F411 Generalized anxiety disorder: Secondary | ICD-10-CM | POA: Diagnosis not present

## 2014-08-06 DIAGNOSIS — R739 Hyperglycemia, unspecified: Secondary | ICD-10-CM | POA: Diagnosis not present

## 2014-08-06 MED ORDER — OMEPRAZOLE 20 MG PO CPDR: 20.0000 mg | DELAYED_RELEASE_CAPSULE | Freq: Every day | ORAL | Status: DC

## 2014-08-06 NOTE — Progress Notes (Signed)
HPI:  Mary Brandt is a 29 yo F prior patient of Roxy Cedar whom recently left this practice, here for follow up:  Prediabetes/morbid obesity/mild HLD: -dx in 2015 with her psychiatrist -lifestyle recs with prior PCP about 6 months ago -diet and exercise: disc golf 1x per week starting next week, goes to the gym a few days per week when she can - not good about this lately; using calorie app on phone to try to stick to 2000 calories per day -denies: polyuria, polydipsia, vision changes  Asthma: -reports only as a child, no asthma since except when really sick -no symptoms in the last year  GERD: -takes omeprazole 40mg  daily -reports ran out of this medication 1.5 weeks ago and reports bad heartburn without it -denies: hx stricture, dysphagia, nausea or vomiting  Sees gyn for annual gynecological exams  Sees psychiatrist for GAD.  ROS: See pertinent positives and negatives per HPI.  Past Medical History  Diagnosis Date  . Asthma   . Anxiety   . Asthma   . Morbid obesity     Past Surgical History  Procedure Laterality Date  . Finger laceration Right 2008    2nd finger  . Other surgical history Left [id number]     Implanon  . Wisdom tooth extraction    . Mole removal    . Implanon insertion  06-26-11    left upper arm    Family History  Problem Relation Age of Onset  . Atrial fibrillation Mother   . Diabetes Father   . Cancer Father     skin  . Polycystic ovary syndrome Sister   . Depression Sister   . Anxiety disorder Sister   . Bipolar disorder Sister   . Cancer Maternal Grandfather     skin  . Dementia Maternal Grandmother     History   Social History  . Marital Status: Married    Spouse Name: Mortimer Fries  . Number of Children: 0  . Years of Education: 22   Occupational History  . LEGAL ASSISTANT    . [place] History Main Topics  . Smoking status: Never Smoker   . Smokeless tobacco: Not on file  . Alcohol  Use: No  . Drug Use: No  . Sexual Activity:    Partners: Male    Birth Control/ Protection: Condom, OCP     Comment: Microgestin   Other Topics Concern  . None   Social History Narrative   ** Merged History Encounter **       Marital Status: Married Chief Operating Officer)    Children:  None    Pets:  None    Living Situation: Lives with his husband.   Occupation: Civil Service fast streamer     Education: Forensic psychologist ([place]) [place])    Alcohol Use:  Occasional   Tobacco Use:  None    Drug Use:  None   Diet:  Regular   Exercise:  None   Hobbies:  Reading, Movies                  Current outpatient prescriptions:  .  DULoxetine (CYMBALTA) 60 MG capsule, Take 1 capsule (60 mg total) by mouth daily., Disp: 30 capsule, Rfl: 2 .  ibuprofen (ADVIL,MOTRIN) 200 MG tablet, Take 3 tablets (600 mg total) by mouth every 6 (six) hours as needed for moderate pain., Disp: 30 tablet, Rfl: 0 .  norethindrone-ethinyl estradiol (MICROGESTIN,JUNEL,LOESTRIN) 1-20 MG-MCG  tablet, Take 1 tablet by mouth daily., Disp: 1 Package, Rfl: 11 .  omeprazole (PRILOSEC) 20 MG capsule, Take 1 capsule (20 mg total) by mouth daily., Disp: 90 capsule, Rfl: 3  EXAM:  Filed Vitals:   08/06/14 1028  BP: 102/68  Pulse: 92  Temp: 98.3 F (36.8 C)    Body mass index is 55.97 kg/(m^2).  GENERAL: vitals reviewed and listed above, alert, oriented, appears well hydrated and in no acute distress  HEENT: atraumatic, conjunttiva clear, no obvious abnormalities on inspection of external nose and ears  NECK: no obvious masses on inspection  LUNGS: clear to auscultation bilaterally, no wheezes, rales or rhonchi, good air movement  CV: HRRR, no peripheral edema  MS: moves all extremities without noticeable abnormality  PSYCH: pleasant and cooperative, no obvious depression or anxiety  ASSESSMENT AND PLAN:  Discussed the following assessment and plan:  Gastroesophageal reflux disease without  esophagitis - Plan: omeprazole (PRILOSEC) 20 MG capsule  Generalized anxiety disorder  Hyperglycemia  -trial lower dose PPI and lifestyle recs after discussion risks/benefits -discussed importance of healthy diet and regular CV exercise and advised increasing exercise and continued emphasis on diet with trying to avoid heavy or large meals in the evening -we will do her preventive visit at her schedule new pat visit with me in June and get her fasting labs then  -Patient advised to return or notify a doctor immediately if symptoms worsen or persist or new concerns arise.  Patient Instructions  COME FASTING to your appointment in June - drink plenty of water  Try lower dose of the prilosec  We recommend the following healthy lifestyle measures: - eat a healthy diet consisting of lots of vegetables, fruits, beans, nuts, seeds, healthy meats such as white chicken and fish and whole grains.  - avoid fried foods, fast food, processed foods, sodas, red meet and other fattening foods.  - get a least 150 minutes of aerobic exercise per week.   Gastroesophageal Reflux Disease, Adult Gastroesophageal reflux disease (GERD) happens when acid from your stomach flows up into the esophagus. When acid comes in contact with the esophagus, the acid causes soreness (inflammation) in the esophagus. Over time, GERD may create small holes (ulcers) in the lining of the esophagus. CAUSES   Increased body weight. This puts pressure on the stomach, making acid rise from the stomach into the esophagus.  Smoking. This increases acid production in the stomach.  Drinking alcohol. This causes decreased pressure in the lower esophageal sphincter (valve or ring of muscle between the esophagus and stomach), allowing acid from the stomach into the esophagus.  Late evening meals and a full stomach. This increases pressure and acid production in the stomach.  A malformed lower esophageal sphincter. Sometimes, no cause  is found. SYMPTOMS   Burning pain in the lower part of the mid-chest behind the breastbone and in the mid-stomach area. This may occur twice a week or more often.  Trouble swallowing.  Sore throat.  Dry cough.  Asthma-like symptoms including chest tightness, shortness of breath, or wheezing. DIAGNOSIS  Your caregiver may be able to diagnose GERD based on your symptoms. In some cases, X-rays and other tests may be done to check for complications or to check the condition of your stomach and esophagus. TREATMENT  Your caregiver may recommend over-the-counter or prescription medicines to help decrease acid production. Ask your caregiver before starting or adding any new medicines.  HOME CARE INSTRUCTIONS   Change the factors that you can  control. Ask your caregiver for guidance concerning weight loss, quitting smoking, and alcohol consumption.  Avoid foods and drinks that make your symptoms worse, such as:  Caffeine or alcoholic drinks.  Chocolate.  Peppermint or mint flavorings.  Garlic and onions.  Spicy foods.  Citrus fruits, such as oranges, lemons, or limes.  Tomato-based foods such as sauce, chili, salsa, and pizza.  Fried and fatty foods.  Avoid lying down for the 3 hours prior to your bedtime or prior to taking a nap.  Eat small, frequent meals instead of large meals.  Wear loose-fitting clothing. Do not wear anything tight around your waist that causes pressure on your stomach.  Raise the head of your bed 6 to 8 inches with wood blocks to help you sleep. Extra pillows will not help.  Only take over-the-counter or prescription medicines for pain, discomfort, or fever as directed by your caregiver.  Do not take aspirin, ibuprofen, or other nonsteroidal anti-inflammatory drugs (NSAIDs). SEEK IMMEDIATE MEDICAL CARE IF:   You have pain in your arms, neck, jaw, teeth, or back.  Your pain increases or changes in intensity or duration.  You develop nausea,  vomiting, or sweating (diaphoresis).  You develop shortness of breath, or you faint.  Your vomit is green, yellow, black, or looks like coffee grounds or blood.  Your stool is red, bloody, or black. These symptoms could be signs of other problems, such as heart disease, gastric bleeding, or esophageal bleeding. MAKE SURE YOU:   Understand these instructions.  Will watch your condition.  Will get help right away if you are not doing well or get worse. Document Released: 01/31/2005 Document Revised: 07/16/2011 Document Reviewed: 11/10/2010 [facility] Patient Information 2015 [place], [place]. This information is not intended to replace advice given to you by your health care provider. Make sure you discuss any questions you have with your health care provider.      Colin Benton R.

## 2014-08-06 NOTE — Patient Instructions (Signed)
COME FASTING to your appointment in June - drink plenty of water  Try lower dose of the prilosec  We recommend the following healthy lifestyle measures: - eat a healthy diet consisting of lots of vegetables, fruits, beans, nuts, seeds, healthy meats such as white chicken and fish and whole grains.  - avoid fried foods, fast food, processed foods, sodas, red meet and other fattening foods.  - get a least 150 minutes of aerobic exercise per week.   Gastroesophageal Reflux Disease, Adult Gastroesophageal reflux disease (GERD) happens when acid from your stomach flows up into the esophagus. When acid comes in contact with the esophagus, the acid causes soreness (inflammation) in the esophagus. Over time, GERD may create small holes (ulcers) in the lining of the esophagus. CAUSES   Increased body weight. This puts pressure on the stomach, making acid rise from the stomach into the esophagus.  Smoking. This increases acid production in the stomach.  Drinking alcohol. This causes decreased pressure in the lower esophageal sphincter (valve or ring of muscle between the esophagus and stomach), allowing acid from the stomach into the esophagus.  Late evening meals and a full stomach. This increases pressure and acid production in the stomach.  A malformed lower esophageal sphincter. Sometimes, no cause is found. SYMPTOMS   Burning pain in the lower part of the mid-chest behind the breastbone and in the mid-stomach area. This may occur twice a week or more often.  Trouble swallowing.  Sore throat.  Dry cough.  Asthma-like symptoms including chest tightness, shortness of breath, or wheezing. DIAGNOSIS  Your caregiver may be able to diagnose GERD based on your symptoms. In some cases, X-rays and other tests may be done to check for complications or to check the condition of your stomach and esophagus. TREATMENT  Your caregiver may recommend over-the-counter or prescription medicines to help  decrease acid production. Ask your caregiver before starting or adding any new medicines.  HOME CARE INSTRUCTIONS   Change the factors that you can control. Ask your caregiver for guidance concerning weight loss, quitting smoking, and alcohol consumption.  Avoid foods and drinks that make your symptoms worse, such as:  Caffeine or alcoholic drinks.  Chocolate.  Peppermint or mint flavorings.  Garlic and onions.  Spicy foods.  Citrus fruits, such as oranges, lemons, or limes.  Tomato-based foods such as sauce, chili, salsa, and pizza.  Fried and fatty foods.  Avoid lying down for the 3 hours prior to your bedtime or prior to taking a nap.  Eat small, frequent meals instead of large meals.  Wear loose-fitting clothing. Do not wear anything tight around your waist that causes pressure on your stomach.  Raise the head of your bed 6 to 8 inches with wood blocks to help you sleep. Extra pillows will not help.  Only take over-the-counter or prescription medicines for pain, discomfort, or fever as directed by your caregiver.  Do not take aspirin, ibuprofen, or other nonsteroidal anti-inflammatory drugs (NSAIDs). SEEK IMMEDIATE MEDICAL CARE IF:   You have pain in your arms, neck, jaw, teeth, or back.  Your pain increases or changes in intensity or duration.  You develop nausea, vomiting, or sweating (diaphoresis).  You develop shortness of breath, or you faint.  Your vomit is green, yellow, black, or looks like coffee grounds or blood.  Your stool is red, bloody, or black. These symptoms could be signs of other problems, such as heart disease, gastric bleeding, or esophageal bleeding. MAKE SURE YOU:   Understand  these instructions.  Will watch your condition.  Will get help right away if you are not doing well or get worse. Document Released: 01/31/2005 Document Revised: 07/16/2011 Document Reviewed: 11/10/2010 [facility] Patient Information 2015 [place], [place]. This  information is not intended to replace advice given to you by your health care provider. Make sure you discuss any questions you have with your health care provider.

## 2014-08-06 NOTE — Progress Notes (Signed)
Pre visit review using our clinic review tool, if applicable. No additional management support is needed unless otherwise documented below in the visit note. 

## 2014-10-15 ENCOUNTER — Encounter: Payer: Self-pay | Admitting: Family Medicine

## 2014-10-15 ENCOUNTER — Ambulatory Visit (INDEPENDENT_AMBULATORY_CARE_PROVIDER_SITE_OTHER): Payer: 59 | Admitting: Family Medicine

## 2014-10-15 VITALS — BP 108/68 | HR 90 | Temp 98.2°F | Ht 68.0 in | Wt 342.0 lb

## 2014-10-15 DIAGNOSIS — K219 Gastro-esophageal reflux disease without esophagitis: Secondary | ICD-10-CM

## 2014-10-15 DIAGNOSIS — R739 Hyperglycemia, unspecified: Secondary | ICD-10-CM | POA: Diagnosis not present

## 2014-10-15 DIAGNOSIS — F411 Generalized anxiety disorder: Secondary | ICD-10-CM

## 2014-10-15 DIAGNOSIS — E785 Hyperlipidemia, unspecified: Secondary | ICD-10-CM

## 2014-10-15 LAB — LIPID PANEL
CHOL/HDL RATIO: 3
Cholesterol: 147 mg/dL (ref 0–200)
HDL: 42.8 mg/dL (ref >39.00)
LDL Cholesterol: 91 mg/dL (ref 0–99)
NonHDL: 104.2
Triglycerides: 66 mg/dL (ref 0.0–149.0)
VLDL: 13.2 mg/dL (ref 0.0–40.0)

## 2014-10-15 LAB — HEMOGLOBIN A1C: Hgb A1c MFr Bld: 5.6 % (ref 4.6–6.5)

## 2014-10-15 NOTE — Progress Notes (Signed)
HPI:  Mary Brandt is here to establish care.  Last PCP and physical: sees Ethel Rana for th gyn exams and OCP, UTD.  Has the following chronic problems that require follow up and concerns today:  Obesity/Prediabetes/HLD: -going to [employer] weight loss program -doing hcg and b vitamins orally -has lost 26 lbs -on 1000 calorie diet mostly fruits and veggies, doing lots of water -is playing disk golf and weights 2 days per week -feels really good  GAD: -started: several years ago -meds: cymbalta -reports: sees psychiatry for this -denies: SI, prior thoughts of self harm, prior hospitalization for this, worsening  Contraception: -take Junel daily  GERD: -started: -meds: omeprazole 20mg  daily  Asthma: -only as a child, occ bronchitis since -no inhalers, no attacks since she was a child   ROS negative for unless reported above: fevers, unintentional weight loss, hearing or vision loss, chest pain, palpitations, struggling to breath, hemoptysis, melena, hematochezia, hematuria, falls, loc, si, thoughts of self harm  Past Medical History  Diagnosis Date  . Asthma   . Anxiety   . Asthma   . Morbid obesity     sees weight loss clinic  . Hyperlipemia   . Hyperglycemia   . Nevus     sees dermatologist, Dr. Tonia Brooms    Past Surgical History  Procedure Laterality Date  . Finger laceration Right 2008    2nd finger  . Other surgical history Left [id number]     Implanon  . Wisdom tooth extraction    . Mole removal    . Implanon insertion  06-26-11    left upper arm    Family History  Problem Relation Age of Onset  . Atrial fibrillation Mother   . Diabetes Father   . Cancer Father     skin  . Polycystic ovary syndrome Sister   . Depression Sister   . Anxiety disorder Sister   . Bipolar disorder Sister   . Cancer Maternal Grandfather     skin  . Dementia Maternal Grandmother     History   Social History  . Marital Status: Married    Spouse Name:  Mortimer Fries  . Number of Children: 0  . Years of Education: 50   Occupational History  . LEGAL ASSISTANT    . [place] History Main Topics  . Smoking status: Never Smoker   . Smokeless tobacco: Not on file  . Alcohol Use: No  . Drug Use: No  . Sexual Activity:    Partners: Male    Birth Control/ Protection: Condom, OCP     Comment: Microgestin   Other Topics Concern  . None   Social History Narrative      Marital Status: Married Chief Operating Officer)    Children:  None    Pets:  5 dogs   Living Situation: Lives with his husband.   Occupation: Civil Service fast streamer     Education: Forensic psychologist ([place]) [place])    Alcohol Use:  Occasional   Tobacco Use:  None    Drug Use:  None   Diet:  Regular   Exercise:  None   Hobbies:  Reading, Movies                  Current outpatient prescriptions:  .  DULoxetine (CYMBALTA) 60 MG capsule, Take 1 capsule (60 mg total) by mouth daily., Disp: 30 capsule, Rfl: 2 .  ibuprofen (ADVIL,MOTRIN) 200 MG tablet, Take  3 tablets (600 mg total) by mouth every 6 (six) hours as needed for moderate pain., Disp: 30 tablet, Rfl: 0 .  magnesium citrate solution, Take 296 mLs by mouth once., Disp: , Rfl:  .  NON FORMULARY, Formula one, Disp: , Rfl:  .  NON FORMULARY, Hcg/methylcobalamin/ionstil ([facility] weight loss program), Disp: , Rfl:  .  norethindrone-ethinyl estradiol (MICROGESTIN,JUNEL,LOESTRIN) 1-20 MG-MCG tablet, Take 1 tablet by mouth daily., Disp: 1 Package, Rfl: 11 .  omeprazole (PRILOSEC) 20 MG capsule, Take 1 capsule (20 mg total) by mouth daily., Disp: 90 capsule, Rfl: 3 .  Pyridoxine HCl (VITAMIN B-6 PO), Take by mouth., Disp: , Rfl:   EXAM:  Filed Vitals:   10/15/14 1415  BP: 108/68  Pulse: 90  Temp: 98.2 F (36.8 C)    Body mass index is 52.01 kg/(m^2).  GENERAL: vitals reviewed and listed above, alert, oriented, appears well hydrated and in no acute distress  HEENT:  atraumatic, conjunttiva clear, no obvious abnormalities on inspection of external nose and ears  NECK: no obvious masses on inspection  LUNGS: clear to auscultation bilaterally, no wheezes, rales or rhonchi, good air movement  CV: HRRR, no peripheral edema  MS: moves all extremities without noticeable abnormality  PSYCH: pleasant and cooperative, no obvious depression or anxiety  ASSESSMENT AND PLAN:  Discussed the following assessment and plan:  Hyperglycemia - Plan: Hemoglobin A1c  Morbid obesity - Plan: Lipid Panel  Gastroesophageal reflux disease without esophagitis  Generalized anxiety disorder  Hyperlipemia  -We reviewed the PMH, PSH, FH, SH, Meds and Allergies. -We provided refills for any medications we will prescribe as needed. -We addressed current concerns per orders and patient instructions. -We have asked for records for pertinent exams, studies, vaccines and notes from previous providers. -We have advised patient to follow up per instructions below.   -Patient advised to return or notify a doctor immediately if symptoms worsen or persist or new concerns arise.  Patient Instructions  BEFORE YOU LEAVE: -labs -schedule follow up in 6 months  -We have ordered labs or studies at this visit. It can take up to 1-2 weeks for results and processing. We will contact you with instructions IF your results are abnormal. Normal results will be released to your [facility]. If you have not heard from [place] or can not find your results in [facility] in 2 weeks please contact our office.  -PLEASE SIGN UP FOR MYCHART TODAY   We recommend the following healthy lifestyle measures: - eat a healthy diet consisting of lots of vegetables, fruits, beans, nuts, seeds, healthy meats such as white chicken and fish  - avoid starches, sweets, fried foods, fast food, processed foods, sodas, red meet and other fattening foods.  - get a least 150 minutes of aerobic exercise per week.          Colin Benton R.

## 2014-10-15 NOTE — Patient Instructions (Addendum)
BEFORE YOU LEAVE: -labs -schedule follow up in 6 months  -We have ordered labs or studies at this visit. It can take up to 1-2 weeks for results and processing. We will contact you with instructions IF your results are abnormal. Normal results will be released to your [facility]. If you have not heard from [place] or can not find your results in [facility] in 2 weeks please contact our office.  -PLEASE SIGN UP FOR MYCHART TODAY   We recommend the following healthy lifestyle measures: - eat a healthy diet consisting of lots of vegetables, fruits, beans, nuts, seeds, healthy meats such as white chicken and fish  - avoid starches, sweets, fried foods, fast food, processed foods, sodas, red meet and other fattening foods.  - get a least 150 minutes of aerobic exercise per week.

## 2014-10-15 NOTE — Progress Notes (Signed)
Pre visit review using our clinic review tool, if applicable. No additional management support is needed unless otherwise documented below in the visit note. 

## 2014-10-18 ENCOUNTER — Encounter (HOSPITAL_COMMUNITY): Payer: Self-pay | Admitting: Psychiatry

## 2014-10-18 ENCOUNTER — Ambulatory Visit (INDEPENDENT_AMBULATORY_CARE_PROVIDER_SITE_OTHER): Payer: 59 | Admitting: Psychiatry

## 2014-10-18 VITALS — BP 126/80 | HR 94 | Ht 68.0 in | Wt 338.6 lb

## 2014-10-18 DIAGNOSIS — F419 Anxiety disorder, unspecified: Secondary | ICD-10-CM

## 2014-10-18 DIAGNOSIS — F329 Major depressive disorder, single episode, unspecified: Secondary | ICD-10-CM

## 2014-10-18 DIAGNOSIS — F411 Generalized anxiety disorder: Secondary | ICD-10-CM

## 2014-10-18 MED ORDER — DULOXETINE HCL 60 MG PO CPEP: 60.0000 mg | ORAL_CAPSULE | Freq: Every day | ORAL | Status: DC

## 2014-10-18 NOTE — Progress Notes (Signed)
[facility] [phone] Progress Note  Mary Brandt [id number] 29 y.o.  10/18/2014 12:24 PM  Chief Complaint:  Medication management and follow-up.    History of Present Illness:  Mary Brandt came for her followup appointment.  She is doing better on Cymbalta.  She denies any anxiety or any irritability.  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  She is taking next month off so she can study.  Her sleep is good.  She denies any irritability, anger, mood swings.  She denies any feeling of hopelessness or worthlessness.  She continues to work at [employer] office and there has been no new issues.  Sometimes she believe her job is stressful but she is able to handle very well.  Patient denies any paranoia or any hallucination.  She has lost more than 20 pounds in past 3 months and she is feeling very happy and proud on it.  She has changed her schedule including her diet, doing regular exercise .  Her appetite is okay.  Patient denies drinking or using any illegal substances.  Her energy level is good.  Patient lives with her-in-law who are very supportive.  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  She endorsed symptoms of social isolation, lack of energy, irritability, panic attack, excessive anxiety , withdrawn and lack of interest in her daily activities.  [facility] very well until December 2014 she stopped.  She had significant withdrawal symptoms when she stops Cymbalta.  We have tried Wellbutrin however it did not work and her symptoms get worse.   Anxiety: Yes Bipolar Disorder: No Depression: Yes Mania: No Psychosis: No Schizophrenia: No Personality Disorder: No Hospitalization  for psychiatric illness: No History of Electroconvulsive Shock Therapy: No Prior Suicide Attempts: No  Medical History; Patient has morbid obesity.  She see family nurse practitioner at [facility].   ROS   Psychiatric: Agitation: No Hallucination: No Depressed Mood: No Insomnia: No Hypersomnia: No Altered Concentration: No Feels Worthless: No Grandiose Ideas: No Belief In Special Powers: No New/Increased Substance Abuse: No Compulsions: No  Neurologic: Headache: No Seizure: No Paresthesias: No   Outpatient Encounter Prescriptions as of 10/18/2014  Medication Sig  . DULoxetine (CYMBALTA) 60 MG capsule Take 1 capsule (60 mg total) by mouth daily.  . magnesium citrate solution Take 296 mLs by mouth once.  . NON FORMULARY Formula one  . NON FORMULARY Hcg/methylcobalamin/ionstil ([facility] weight loss program)  . norethindrone-ethinyl estradiol (MICROGESTIN,JUNEL,LOESTRIN) 1-20 MG-MCG tablet Take 1 tablet by mouth daily.  Marland Kitchen omeprazole (PRILOSEC) 20 MG capsule Take 1 capsule (20 mg total) by mouth daily.  . Pyridoxine HCl (VITAMIN B-6 PO) Take by mouth.  . [DISCONTINUED] DULoxetine (CYMBALTA) 60 MG capsule Take 1 capsule (60 mg total) by mouth daily.  Marland Kitchen ibuprofen (ADVIL,MOTRIN) 200 MG tablet Take 3 tablets (600 mg total) by mouth every 6 (six) hours as needed for moderate pain. (Patient not taking: Reported on 10/18/2014)   No facility-administered encounter medications on file as of 10/18/2014.    Physical Exam: Constitutional:  BP 126/80 mmHg  Pulse 94  Ht 5\' 8"  (1.727 m)  Wt 338 lb 9.6 oz (153.588 kg)  BMI 51.50 kg/m2  LMP 10/07/2014  Musculoskeletal:  Strength & Muscle Tone: within normal limits Gait & Station: normal Patient leans: N/A  Mental Status Examination;  Patient is casually dressed and fairly groomed. She is pleasant and cooperative.  She maintained good eye contact.  Her speech is clear and coherent.  Her thought process is logical and goal-directed.  She  describes her mood  euthymic and her affect is appropriate.  She denies any auditory or visual hallucination.  There were no paranoia, delusions obsession present at this time.  Her fund of knowledge is adequate.  She has no tremors or shakes.  She denies any active or passive suicidal thoughts or homicidal thoughts.  There were no flight of ideas or any loose association.  She is alert and oriented x3.  Her insight judgment and impulse control is okay.   Established Problem, Stable/Improving (1), Review of Last Therapy Session (1) and Review of Medication Regimen & Side Effects (2)  Assessment: Axis I: Anxiety disorder NOS, depressive disorder NOS  Axis II: Deferred  Axis III:  Past Medical History  Diagnosis Date  . Asthma   . Anxiety   . Asthma   . Morbid obesity     sees weight loss clinic  . Hyperlipemia   . Hyperglycemia   . Nevus     sees dermatologist, Dr. Tonia Brooms   Plan:  Patient is doing better on Cymbalta 60 mg.  She does not have any side effects including any tremors or shakes.  I will continue Cymbalta 60 mg daily.  Recommended to call us back if she has any question, concern or if she feels worsening of symptoms.  I will see her again in 3 months.    Nekeisha Aure T., MD 10/18/2014

## 2015-01-18 ENCOUNTER — Ambulatory Visit (INDEPENDENT_AMBULATORY_CARE_PROVIDER_SITE_OTHER): Payer: 59 | Admitting: Psychiatry

## 2015-01-18 ENCOUNTER — Encounter (HOSPITAL_COMMUNITY): Payer: Self-pay | Admitting: Psychiatry

## 2015-01-18 VITALS — BP 118/74 | HR 86 | Ht 67.0 in | Wt 341.4 lb

## 2015-01-18 DIAGNOSIS — F329 Major depressive disorder, single episode, unspecified: Secondary | ICD-10-CM

## 2015-01-18 DIAGNOSIS — F419 Anxiety disorder, unspecified: Secondary | ICD-10-CM

## 2015-01-18 DIAGNOSIS — F411 Generalized anxiety disorder: Secondary | ICD-10-CM

## 2015-01-18 MED ORDER — DULOXETINE HCL 60 MG PO CPEP: 60.0000 mg | ORAL_CAPSULE | Freq: Every day | ORAL | Status: DC

## 2015-01-18 NOTE — Progress Notes (Signed)
[facility] [phone] Progress Note  Mary Brandt [id number] 29 y.o.  01/18/2015 8:50 AM  Chief Complaint:  Medication management and follow-up.    History of Present Illness:  Mary Brandt came for her followup appointment.  She is taking Cymbalta 60 mg and denied any side effects.  She took the exam in July but she was disappointed that she did not past.  She admitted getting frustrated but she is not giving up and she liked to take the exam again in February.  She wants to switch the job .  She is working in [place] and not happy with traveling.  Her plan is to quit the job before Thanksgiving and find someone in [place].  She also wants to take some time off before the exam in February.  Overall she described her mood has been stable.  However she admitted not taking her vitamins and watching her diet.  She gained 3 pounds and she is not happy about it.  She sleeping okay.  She denies any irritability, anger, mood swing.  She denies any crying spells.  Her energy level is okay.  She denies any feeling of hopelessness or worthlessness.  She lives with her in-laws who are very supportive.  She denies drinking or using any illegal substances.  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  She endorsed symptoms of social isolation, lack of energy, irritability, panic attack, excessive anxiety , withdrawn and lack of interest in her daily activities.  [facility] very well until December 2014 she stopped.  She had significant withdrawal symptoms when she stops Cymbalta.  We have tried Wellbutrin however it did not work and her symptoms get worse.   Anxiety: Yes Bipolar Disorder: No Depression:  Yes Mania: No Psychosis: No Schizophrenia: No Personality Disorder: No Hospitalization for psychiatric illness: No History of Electroconvulsive Shock Therapy: No Prior Suicide Attempts: No  Medical History; Patient has morbid obesity.  She see family nurse practitioner at [facility].   ROS   Psychiatric: Agitation: No Hallucination: No Depressed Mood: No Insomnia: No Hypersomnia: No Altered Concentration: No Feels Worthless: No Grandiose Ideas: No Belief In Special Powers: No New/Increased Substance Abuse: No Compulsions: No  Neurologic: Headache: No Seizure: No Paresthesias: No   Outpatient Encounter Prescriptions as of 01/18/2015  Medication Sig  . DULoxetine (CYMBALTA) 60 MG capsule Take 1 capsule (60 mg total) by mouth daily.  Marland Kitchen ibuprofen (ADVIL,MOTRIN) 200 MG tablet Take 3 tablets (600 mg total) by mouth every 6 (six) hours as needed for moderate pain. (Patient not taking: Reported on 10/18/2014)  . magnesium citrate solution Take 296 mLs by mouth once.  . NON FORMULARY Formula one  . NON FORMULARY Hcg/methylcobalamin/ionstil ([facility] weight loss program)  . norethindrone-ethinyl estradiol (MICROGESTIN,JUNEL,LOESTRIN) 1-20 MG-MCG tablet Take 1 tablet by mouth daily.  Marland Kitchen omeprazole (PRILOSEC) 20 MG capsule Take 1 capsule (20 mg total) by mouth daily.  . Pyridoxine HCl (VITAMIN B-6 PO) Take by mouth.  . [DISCONTINUED] DULoxetine (CYMBALTA) 60 MG capsule Take 1 capsule (60 mg total) by mouth daily.   No facility-administered encounter medications on file as of 01/18/2015.    Physical Exam: Constitutional:  BP 118/74 mmHg  Pulse 86  Ht 5'  7" (1.702 m)  Wt 341 lb 6.4 oz (154.858 kg)  BMI 53.46 kg/m2  Musculoskeletal: Strength & Muscle Tone: within normal limits Gait & Station: normal Patient leans: N/A  Mental Status Examination;  Patient is casually dressed and fairly groomed. She is cooperative and maintained good eye contact.  Her speech is clear and  coherent.  Her thought process is logical and goal-directed.  She describes her mood  euthymic and her affect is appropriate.  She denies any auditory or visual hallucination.  There were no paranoia, delusions obsession present at this time.  Her fund of knowledge is adequate.  She has no tremors or shakes.  She denies any active or passive suicidal thoughts or homicidal thoughts.  There were no flight of ideas or any loose association.  She is alert and oriented x3.  Her insight judgment and impulse control is okay.   Established Problem, Stable/Improving (1), Review of Psycho-Social Stressors (1), Review of Last Therapy Session (1) and Review of Medication Regimen & Side Effects (2)  Assessment: Axis I: Anxiety disorder NOS, depressive disorder NOS  Axis II: Deferred  Axis III:  Past Medical History  Diagnosis Date  . Asthma   . Anxiety   . Asthma   . Morbid obesity     sees weight loss clinic  . Hyperlipemia   . Hyperglycemia   . Nevus     sees dermatologist, Dr. Tonia Brooms   Plan:  Reassurance given .  Discuss future planning as patient like to switch the job so she can avoid traveling .  She liked to take the exam again in February.  She does not want to change her Cymbalta.  Encouraged to restart her vitamins and watching her calorie intake.  At this time patient does not need any therapist for counseling.  However she promised if needed she will call us.  I will continue Cymbalta 60 mg daily.  She has no side effects including any tremors or shakes. Recommended to call us back if she has any question, concern or if she feels worsening of symptoms.  I will see her again in 3 months.    Kniyah Khun T., MD 01/18/2015

## 2015-02-23 ENCOUNTER — Ambulatory Visit (INDEPENDENT_AMBULATORY_CARE_PROVIDER_SITE_OTHER): Payer: 59 | Admitting: Certified Nurse Midwife

## 2015-02-23 ENCOUNTER — Encounter: Payer: Self-pay | Admitting: Certified Nurse Midwife

## 2015-02-23 VITALS — BP 112/72 | HR 74 | Resp 16 | Ht 68.5 in | Wt 355.0 lb

## 2015-02-23 DIAGNOSIS — Z3041 Encounter for surveillance of contraceptive pills: Secondary | ICD-10-CM | POA: Diagnosis not present

## 2015-02-23 DIAGNOSIS — Z0000 Encounter for general adult medical examination without abnormal findings: Secondary | ICD-10-CM

## 2015-02-23 DIAGNOSIS — Z01419 Encounter for gynecological examination (general) (routine) without abnormal findings: Secondary | ICD-10-CM

## 2015-02-23 LAB — POCT URINALYSIS DIPSTICK
Bilirubin, UA: NEGATIVE
Blood, UA: NEGATIVE
Glucose, UA: NEGATIVE
Ketones, UA: NEGATIVE
LEUKOCYTES UA: NEGATIVE
NITRITE UA: NEGATIVE
PH UA: 5
PROTEIN UA: NEGATIVE
UROBILINOGEN UA: NEGATIVE

## 2015-02-23 MED ORDER — NORETHINDRONE ACET-ETHINYL EST 1-20 MG-MCG PO TABS: 1.0000 | ORAL_TABLET | Freq: Every day | ORAL | Status: DC

## 2015-02-23 NOTE — Progress Notes (Signed)
Encounter reviewed Mella Inclan, MD   

## 2015-02-23 NOTE — Patient Instructions (Signed)
EXERCISE AND DIET:  We recommended that you start or continue a regular exercise program for good health. Regular exercise means any activity that makes your heart beat faster and makes you sweat.  We recommend exercising at least 30 minutes per day at least 3 days a week, preferably 4 or 5.  We also recommend a diet low in fat and sugar.  Inactivity, poor dietary choices and obesity can cause diabetes, heart attack, stroke, and kidney damage, among others.    ALCOHOL AND SMOKING:  Women should limit their alcohol intake to no more than 7 drinks/beers/glasses of wine (combined, not each!) per week. Moderation of alcohol intake to this level decreases your risk of breast cancer and liver damage. And of course, no recreational drugs are part of a healthy lifestyle.  And absolutely no smoking or even second hand smoke. Most people know smoking can cause heart and lung diseases, but did you know it also contributes to weakening of your bones? Aging of your skin?  Yellowing of your teeth and nails?  CALCIUM AND VITAMIN D:  Adequate intake of calcium and Vitamin D are recommended.  The recommendations for exact amounts of these supplements seem to change often, but generally speaking 600 mg of calcium (either carbonate or citrate) and 800 units of Vitamin D per day seems prudent. Certain women may benefit from higher intake of Vitamin D.  If you are among these women, your doctor will have told you during your visit.    PAP SMEARS:  Pap smears, to check for cervical cancer or precancers,  have traditionally been done yearly, although recent scientific advances have shown that most women can have pap smears less often.  However, every woman still should have a physical exam from her gynecologist every year. It will include a breast check, inspection of the vulva and vagina to check for abnormal growths or skin changes, a visual exam of the cervix, and then an exam to evaluate the size and shape of the uterus and  ovaries.  And after 30 years of age, a rectal exam is indicated to check for rectal cancers. We will also provide age appropriate advice regarding health maintenance, like when you should have certain vaccines, screening for sexually transmitted diseases, bone density testing, colonoscopy, mammograms, etc.   MAMMOGRAMS:  All women over 40 years old should have a yearly mammogram. Many facilities now offer a "3D" mammogram, which may cost around $50 extra out of pocket. If possible,  we recommend you accept the option to have the 3D mammogram performed.  It both reduces the number of women who will be called back for extra views which then turn out to be normal, and it is better than the routine mammogram at detecting truly abnormal areas.    COLONOSCOPY:  Colonoscopy to screen for colon cancer is recommended for all women at age 50.  We know, you hate the idea of the prep.  We agree, BUT, having colon cancer and not knowing it is worse!!  Colon cancer so often starts as a polyp that can be seen and removed at colonscopy, which can quite literally save your life!  And if your first colonoscopy is normal and you have no family history of colon cancer, most women don't have to have it again for 10 years.  Once every ten years, you can do something that may end up saving your life, right?  We will be happy to help you get it scheduled when you are ready.    Be sure to check your insurance coverage so you understand how much it will cost.  It may be covered as a preventative service at no cost, but you should check your particular policy.     Exercising to Lose Weight Exercising can help you to lose weight. In order to lose weight through exercise, you need to do vigorous-intensity exercise. You can tell that you are exercising with vigorous intensity if you are breathing very hard and fast and cannot hold a conversation while exercising. Moderate-intensity exercise helps to maintain your current weight. You can  tell that you are exercising at a moderate level if you have a higher heart rate and faster breathing, but you are still able to hold a conversation. HOW OFTEN SHOULD I EXERCISE? Choose an activity that you enjoy and set realistic goals. Your health care provider can help you to make an activity plan that works for you. Exercise regularly as directed by your health care provider. This may include:  Doing resistance training twice each week, such as:  Push-ups.  Sit-ups.  Lifting weights.  Using resistance bands.  Doing a given intensity of exercise for a given amount of time. Choose from these options:  150 minutes of moderate-intensity exercise every week.  75 minutes of vigorous-intensity exercise every week.  A mix of moderate-intensity and vigorous-intensity exercise every week. Children, pregnant women, people who are out of shape, people who are overweight, and older adults may need to consult a health care provider for individual recommendations. If you have any sort of medical condition, be sure to consult your health care provider before starting a new exercise program. WHAT ARE SOME ACTIVITIES THAT CAN HELP ME TO LOSE WEIGHT?   Walking at a rate of at least 4.5 miles an hour.  Jogging or running at a rate of 5 miles per hour.  Biking at a rate of at least 10 miles per hour.  Lap swimming.  Roller-skating or in-line skating.  Cross-country skiing.  Vigorous competitive sports, such as football, basketball, and soccer.  Jumping rope.  Aerobic dancing. HOW CAN I BE MORE ACTIVE IN MY DAY-TO-DAY ACTIVITIES?  Use the stairs instead of the elevator.  Take a walk during your lunch break.  If you drive, park your car farther away from work or school.  If you take public transportation, get off one stop early and walk the rest of the way.  Make all of your phone calls while standing up and walking around.  Get up, stretch, and walk around every 30 minutes  throughout the day. WHAT GUIDELINES SHOULD I FOLLOW WHILE EXERCISING?  Do not exercise so much that you hurt yourself, feel dizzy, or get very short of breath.  Consult your health care provider prior to starting a new exercise program.  Wear comfortable clothes and shoes with good support.  Drink plenty of water while you exercise to prevent dehydration or heat stroke. Body water is lost during exercise and must be replaced.  Work out until you breathe faster and your heart beats faster.   This information is not intended to replace advice given to you by your health care provider. Make sure you discuss any questions you have with your health care provider.   Document Released: 05/26/2010 Document Revised: 05/14/2014 Document Reviewed: 09/24/2013 Elsevier Interactive Patient Education [employer].

## 2015-02-23 NOTE — Progress Notes (Signed)
29 y.o. G0P0000 Married  Caucasian Fe here for annual exam.  Periods normal, no issues. OCP working well. Sees PCP for aex, labs. Seeing Psychtrist for medication management. Continues on weight loss journey. Had gained approximately 40 pounds and now is back down to 3 pounds less from last aex. Really wants to lose weight so she plan pregnancy in next 2-3 years. Spouse is an Therapist, sports and very supportive of exercise and helping with diet. Has noted some ? Hair loss, but keeps hair pulled back all the time. Has not seen hair dresser or had hair cut in a year. Plans to soon and will see Dermatology if needed if felt to be a problem. No other health concerns today.  Patient's last menstrual period was 02/03/2015.          Sexually active: Yes.    The current method of family planning is OCP (estrogen/progesterone).    Exercising: Yes.    cardio & weights Smoker:  no  Health Maintenance: Pap: 02-18-14 neg MMG:  none Colonoscopy:  none BMD:   none TDaP:  2008 Labs: poct urine-neg Self breast exam: done occ   reports that she has never smoked. She does not have any smokeless tobacco history on file. She reports that she drinks alcohol. She reports that she does not use illicit drugs.  Past Medical History  Diagnosis Date  . Asthma   . Anxiety   . Asthma   . Morbid obesity ([place])     sees weight loss clinic  . Hyperlipemia   . Hyperglycemia   . Nevus     sees dermatologist, Dr. Tonia Brooms    Past Surgical History  Procedure Laterality Date  . Finger laceration Right 2008    2nd finger  . Other surgical history Left [id number]     Implanon  . Wisdom tooth extraction    . Mole removal    . Implanon insertion  06-26-11    left upper arm    Current Outpatient Prescriptions  Medication Sig Dispense Refill  . DULoxetine (CYMBALTA) 60 MG capsule Take 1 capsule (60 mg total) by mouth daily. 30 capsule 2  . ibuprofen (ADVIL,MOTRIN) 200 MG tablet Take 3 tablets (600 mg total) by mouth every 6 (six)  hours as needed for moderate pain. 30 tablet 0  . magnesium citrate solution Take 296 mLs by mouth once.    . NON FORMULARY Formula one    . NON FORMULARY Hcg/methylcobalamin/ionstil ([facility] weight loss program)    . norethindrone-ethinyl estradiol (MICROGESTIN,JUNEL,LOESTRIN) 1-20 MG-MCG tablet Take 1 tablet by mouth daily. 1 Package 11  . omeprazole (PRILOSEC) 20 MG capsule Take 1 capsule (20 mg total) by mouth daily. 90 capsule 3  . Pyridoxine HCl (VITAMIN B-6 PO) Take by mouth.     No current facility-administered medications for this visit.    Family History  Problem Relation Age of Onset  . Atrial fibrillation Mother   . Diabetes Father   . Cancer Father     skin  . Polycystic ovary syndrome Sister   . Depression Sister   . Anxiety disorder Sister   . Bipolar disorder Sister   . Cancer Maternal Grandfather     skin  . Dementia Maternal Grandmother     ROS:  Pertinent items are noted in HPI.  Otherwise, a comprehensive ROS was negative.  Exam:   BP 112/72 mmHg  Pulse 74  Resp 16  Ht 5' 8.5" (1.74 m)  Wt 355 lb (161.027 kg)  BMI  53.19 kg/m2  LMP 02/03/2015 Height: 5' 8.5" (174 cm) Ht Readings from Last 3 Encounters:  02/23/15 5' 8.5" (1.74 m)  01/18/15 5\' 7"  (1.702 m)  10/18/14 5\' 8"  (1.727 m)    General appearance: alert, cooperative and appears stated age Head: Normocephalic, without obvious abnormality, atraumatic Neck: no adenopathy, supple, symmetrical, trachea midline and thyroid normal to inspection and palpation Lungs: clear to auscultation bilaterally Breasts: normal appearance, no masses or tenderness, No nipple retraction or dimpling, No nipple discharge or bleeding, No axillary or supraclavicular adenopathy Heart: regular rate and rhythm Abdomen: soft, non-tender; no masses,  no organomegaly Extremities: extremities normal, atraumatic, no cyanosis or edema Skin: Skin color, texture, turgor normal. No rashes or lesions Lymph nodes: Cervical,  supraclavicular, and axillary nodes normal. No abnormal inguinal nodes palpated Neurologic: Grossly normal   Pelvic: External genitalia:  no lesions              Urethra:  normal appearing urethra with no masses, tenderness or lesions              Bartholin's and Skene's: normal                 Vagina: normal appearing vagina with normal color and discharge, no lesions              Cervix: normal,non tender,no lesions              Pap taken: No. Bimanual Exam:  Uterus:  normal and difficult to feel due to body habitus  No large masses noted              Adnexa: no mass, fullness, tenderness unable to palpate adnexa due to body habitus               Rectovaginal: Confirms               Anus:  normal sphincter tone, no lesions  Chaperone present: yes   A:  Well Woman with normal exam  Contraception desires OCP  Morbid Obesity on weight loss  Anxiety/depression with MD management  P:   Reviewed health and wellness pertinent to exam  Rx Microgestin 1/20 see order  Encouraged to continue her weight loss journey for better health and reduce other health issues.  Continue follow up with MD as indicated  Pap smear as above not taken   counseled on breast self exam, use and side effects of OCP's, adequate intake of calcium and vitamin D, diet and exercise  return annually or prn  An After Visit Summary was printed and given to the patient.

## 2015-04-20 ENCOUNTER — Encounter (HOSPITAL_COMMUNITY): Payer: Self-pay | Admitting: Psychiatry

## 2015-04-20 ENCOUNTER — Ambulatory Visit (INDEPENDENT_AMBULATORY_CARE_PROVIDER_SITE_OTHER): Payer: 59 | Admitting: Psychiatry

## 2015-04-20 VITALS — BP 134/88 | HR 96 | Ht 68.0 in | Wt 362.8 lb

## 2015-04-20 DIAGNOSIS — F329 Major depressive disorder, single episode, unspecified: Secondary | ICD-10-CM

## 2015-04-20 DIAGNOSIS — F411 Generalized anxiety disorder: Secondary | ICD-10-CM

## 2015-04-20 MED ORDER — DULOXETINE HCL 60 MG PO CPEP: 60.0000 mg | ORAL_CAPSULE | Freq: Every day | ORAL | Status: DC

## 2015-04-20 NOTE — Progress Notes (Signed)
[facility] [phone] Progress Note  Mary Brandt:[id number] 29 y.o.  04/20/2015 10:43 AM  Chief Complaint:  Medication management and follow-up.    History of Present Illness:  Mary Brandt came for her followup appointment.  She stopped working due to travel issues and now she is more focusing on studies.  She wants to take exam in February.  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  She is taking her medication and denies any side effects.  Her sleep is good.  She denies any irritability, anger, paranoia or any hallucination.  She denies any crying spells or any feeling of hopelessness or worthlessness.  She admitted not watching her calories and doing her diet plan but she liked to go back on her treatment plan for her diet.  She gained weight from the last visit .  However her energy level is good.  She denies drinking or using any illegal substances.  She lives with her in-laws who are very supportive.  Her husband is nurse at [employer].  She had a good Thanksgiving and she is looking forward to have a good Christmas.  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  She endorsed symptoms of social isolation, lack of energy, irritability, panic attack, excessive anxiety , withdrawn and lack of interest in her daily activities.  [facility] well until December 2014 she stopped.  She had significant withdrawal symptoms when she stops Cymbalta.  We have tried Wellbutrin however it did not work and her symptoms get worse.   Anxiety: Yes Bipolar Disorder: No Depression: Yes Mania: No Psychosis: No Schizophrenia: No Personality Disorder: No Hospitalization for  psychiatric illness: No History of Electroconvulsive Shock Therapy: No Prior Suicide Attempts: No  Medical History; Patient has morbid obesity.  She see family nurse practitioner at [employer].   ROS   Psychiatric: Agitation: No Hallucination: No Depressed Mood: No Insomnia: No Hypersomnia: No Altered Concentration: No Feels Worthless: No Grandiose Ideas: No Belief In Special Powers: No New/Increased Substance Abuse: No Compulsions: No  Neurologic: Headache: No Seizure: No Paresthesias: No   Outpatient Encounter Prescriptions as of 04/20/2015  Medication Sig  . DULoxetine (CYMBALTA) 60 MG capsule Take 1 capsule (60 mg total) by mouth daily.  Marland Kitchen ibuprofen (ADVIL,MOTRIN) 200 MG tablet Take 3 tablets (600 mg total) by mouth every 6 (six) hours as needed for moderate pain.  . magnesium citrate solution Take 296 mLs by mouth once.  . norethindrone-ethinyl estradiol (MICROGESTIN,JUNEL,LOESTRIN) 1-20 MG-MCG tablet Take 1 tablet by mouth daily.  Marland Kitchen omeprazole (PRILOSEC) 20 MG capsule Take 1 capsule (20 mg total) by mouth daily.  . Pyridoxine HCl (VITAMIN B-6 PO) Take by mouth.  . [DISCONTINUED] DULoxetine (CYMBALTA) 60 MG capsule Take 1 capsule (60 mg total) by mouth daily.  . NON FORMULARY Formula one  . NON FORMULARY Hcg/methylcobalamin/ionstil ([facility] weight loss program)   No facility-administered encounter medications on file as of 04/20/2015.    Physical Exam: Constitutional:  BP 134/88 mmHg  Pulse 96  Ht 5\' 8"  (1.727 m)  Wt 362 lb 12.8 oz (164.565 kg)  BMI 55.18 kg/m2  Musculoskeletal: Strength & Muscle Tone: within normal limits Gait & Station: normal Patient  leans: N/A  Mental Status Examination;  Patient is casually dressed and fairly groomed. She is cooperative and maintained good eye contact.  Her speech is clear and coherent.  Her thought process is logical and goal-directed.  She describes her mood  euthymic and her affect is appropriate.  She denies any  auditory or visual hallucination.  There were no paranoia, delusions obsession present at this time.  Her fund of knowledge is adequate.  She has no tremors or shakes.  She denies any active or passive suicidal thoughts or homicidal thoughts.  There were no flight of ideas or any loose association.  She is alert and oriented x3.  Her insight judgment and impulse control is okay.   Established Problem, Stable/Improving (1), Review of Psycho-Social Stressors (1), Review of Last Therapy Session (1) and Review of Medication Regimen & Side Effects (2)  Assessment: Axis I: Anxiety disorder NOS, depressive disorder NOS  Axis II: Deferred  Axis III:  Past Medical History  Diagnosis Date  . Asthma   . Anxiety   . Asthma   . Morbid obesity ([place])     sees weight loss clinic  . Hyperlipemia   . Hyperglycemia   . Nevus     sees dermatologist, Dr. Tonia Brooms   Plan:  Patient is stable on Cymbalta denies any side effects.  She has no tremors or shakes.  She does not want to change her medication or dosage. Encouraged to restart her vitamins and watching her calorie intake.  At this time patient does not need any therapist for counseling.  Recommended to call us back if she has any question, concern or if she feels worsening of symptoms.  I will see her again in 3 months.    Adom Schoeneck T., MD 04/20/2015

## 2015-04-22 ENCOUNTER — Ambulatory Visit (INDEPENDENT_AMBULATORY_CARE_PROVIDER_SITE_OTHER): Payer: 59 | Admitting: Family Medicine

## 2015-04-22 DIAGNOSIS — R69 Illness, unspecified: Secondary | ICD-10-CM

## 2015-04-22 NOTE — Progress Notes (Signed)
NO SHOW

## 2015-05-04 ENCOUNTER — Ambulatory Visit (INDEPENDENT_AMBULATORY_CARE_PROVIDER_SITE_OTHER): Payer: 59 | Admitting: Family Medicine

## 2015-05-04 ENCOUNTER — Encounter: Payer: Self-pay | Admitting: Family Medicine

## 2015-05-04 DIAGNOSIS — Z23 Encounter for immunization: Secondary | ICD-10-CM | POA: Diagnosis not present

## 2015-05-04 DIAGNOSIS — K219 Gastro-esophageal reflux disease without esophagitis: Secondary | ICD-10-CM | POA: Diagnosis not present

## 2015-05-04 DIAGNOSIS — R739 Hyperglycemia, unspecified: Secondary | ICD-10-CM

## 2015-05-04 DIAGNOSIS — E785 Hyperlipidemia, unspecified: Secondary | ICD-10-CM

## 2015-05-04 DIAGNOSIS — F411 Generalized anxiety disorder: Secondary | ICD-10-CM

## 2015-05-04 NOTE — Progress Notes (Signed)
HPI:  Mary Brandt is here for and acute visit for:  URI: -started: several weeks ago - now better and almost gone since made the appointment -symptoms:nasal congestion, sore throat, cough -denies:fever, SOB, NVD, tooth pain -sick contacts/travel/risks: denies flu exposure, tick exposure or or Ebola risks  She also missed/No Showed her recent follow up visit:  Obesity/Prediabetes/HLD: -labs 6/16 looked good -did [facility] weight loss program - but then stopped - now restarting the diet and exercise  GAD: -started: several years ago -meds: cymbalta -reports: sees psychiatry for this -denies: SI, prior thoughts of self harm, prior hospitalization for this, worsening  Contraception: -take Junel daily, sees gyn  GERD: -meds: omeprazole 20mg  daily -has bad acid reflux if stops  Asthma: -only as a child, occ bronchitis since -no inhalers, no attacks since she was a child  ROS: See pertinent positives and negatives per HPI.  Past Medical History  Diagnosis Date  . Asthma   . Anxiety   . Asthma   . Morbid obesity ([place])     sees weight loss clinic  . Hyperlipemia   . Hyperglycemia   . Nevus     sees dermatologist, Dr. Tonia Brooms    Past Surgical History  Procedure Laterality Date  . Finger laceration Right 2008    2nd finger  . Other surgical history Left [id number]     Implanon  . Wisdom tooth extraction    . Mole removal    . Implanon insertion  06-26-11    left upper arm    Family History  Problem Relation Age of Onset  . Atrial fibrillation Mother   . Diabetes Father   . Cancer Father     skin  . Polycystic ovary syndrome Sister   . Depression Sister   . Anxiety disorder Sister   . Bipolar disorder Sister   . Cancer Maternal Grandfather     skin  . Dementia Maternal Grandmother     Social History   Social History  . Marital Status: Married    Spouse Name: Mortimer Fries  . Number of Children: 0  . Years of Education: 75   Occupational History  .  LEGAL ASSISTANT    . [place] History Main Topics  . Smoking status: Never Smoker   . Smokeless tobacco: Never Used  . Alcohol Use: No  . Drug Use: No  . Sexual Activity:    Partners: Male    Birth Control/ Protection: Pill   Other Topics Concern  . None   Social History Narrative      Marital Status: Married Chief Operating Officer)    Children:  None    Pets:  5 dogs   Living Situation: Lives with his husband.   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  DULoxetine (CYMBALTA) 60 MG capsule, Take 1 capsule (60 mg total) by mouth daily., Disp: 30 capsule, Rfl: 2 .  ibuprofen (ADVIL,MOTRIN) 200 MG tablet, Take 3 tablets (600 mg total) by mouth every 6 (six) hours as needed for moderate pain., Disp: 30 tablet, Rfl: 0 .  magnesium citrate solution, Take 296 mLs by mouth once., Disp: , Rfl:  .  NON FORMULARY, Formula one, Disp: , Rfl:  .  NON FORMULARY, Hcg/methylcobalamin/ionstil ([facility] weight loss program), Disp: , Rfl:  .  norethindrone-ethinyl estradiol (MICROGESTIN,JUNEL,LOESTRIN) 1-20 MG-MCG tablet, Take 1 tablet by mouth daily., Disp: 3 Package, Rfl: 4 .  omeprazole (PRILOSEC) 20 MG capsule, Take 1 capsule (20 mg total) by mouth daily., Disp: 90 capsule, Rfl: 3 .  Pyridoxine HCl (VITAMIN B-6 PO), Take by mouth., Disp: , Rfl:   EXAM:  Filed Vitals:   05/04/15 0903  BP: 104/70  Pulse: 103  Temp: 98.5 F (36.9 C)    Body mass index is 55.21 kg/(m^2).  GENERAL: vitals reviewed and listed above, alert, oriented, appears well hydrated and in no acute distress  HEENT: atraumatic, conjunttiva clear, no obvious abnormalities on inspection of external nose and ears, normal appearance of ear canals  and TMs, clear nasal congestion, mild post oropharyngeal erythema with PND, no tonsillar edema or exudate, no sinus TTP  NECK: no obvious masses on inspection  LUNGS: clear to auscultation bilaterally, no wheezes, rales or rhonchi, good air movement  CV: HRRR, no peripheral edema  MS: moves all extremities without noticeable abnormality  PSYCH: pleasant and cooperative, no obvious depression or anxiety  ASSESSMENT AND PLAN:  Discussed the following assessment and plan:  Morbid obesity, unspecified obesity type ([place])  Hyperglycemia  Gastroesophageal reflux disease without esophagitis  Hyperlipemia  -URI resolving, follow up as needed -lifestyle recs -flu shot today -labs in 4-6 months -of course, we advised to return or notify a doctor immediately if symptoms worsen or persist or new concerns arise.    Patient Instructions  BEFORE YOU LEAVE: -flu shot -follow up in 4-6 months  We recommend the following healthy lifestyle measures: - eat a healthy whole foods diet consisting of regular small meals composed of vegetables, fruits, beans, nuts, seeds, healthy meats such as white chicken and fish and whole grains.  - avoid sweets, white starchy foods, fried foods, fast food, processed foods, sodas, red meet and other fattening foods.  - get a least 150-300 minutes of aerobic exercise per week.       Colin Benton R.

## 2015-05-04 NOTE — Progress Notes (Signed)
Pre visit review using our clinic review tool, if applicable. No additional management support is needed unless otherwise documented below in the visit note. 

## 2015-05-04 NOTE — Patient Instructions (Signed)
BEFORE YOU LEAVE: -flu shot -follow up in 4-6 months  We recommend the following healthy lifestyle measures: - eat a healthy whole foods diet consisting of regular small meals composed of vegetables, fruits, beans, nuts, seeds, healthy meats such as white chicken and fish and whole grains.  - avoid sweets, white starchy foods, fried foods, fast food, processed foods, sodas, red meet and other fattening foods.  - get a least 150-300 minutes of aerobic exercise per week.

## 2015-06-01 MED FILL — DULoxetine HCL 60 MG CPEP: 60 | 30 days supply | Qty: 30 | Fill #1

## 2015-06-01 MED FILL — NORETHIND-ETH ESTRAD 1-0.02: 1-20 | 63 days supply | Qty: 63 | Fill #1

## 2015-07-04 ENCOUNTER — Ambulatory Visit (INDEPENDENT_AMBULATORY_CARE_PROVIDER_SITE_OTHER): Payer: 59 | Admitting: Family Medicine

## 2015-07-04 ENCOUNTER — Encounter: Payer: Self-pay | Admitting: Family Medicine

## 2015-07-04 VITALS — BP 100/80 | HR 124 | Temp 98.3°F | Ht 68.0 in | Wt 351.0 lb

## 2015-07-04 DIAGNOSIS — G5602 Carpal tunnel syndrome, left upper limb: Secondary | ICD-10-CM

## 2015-07-04 NOTE — Progress Notes (Signed)
Pre visit review using our clinic review tool, if applicable. No additional management support is needed unless otherwise documented below in the visit note. 

## 2015-07-04 NOTE — Progress Notes (Addendum)
HPI:  Mary Brandt is a 24 F, here for an acute visit for hand pain that started 2-3 weeks ago. She has had some radial sided hand and finger pain and numbness int the index finger. Worse with certain activities. Types a lot. No weakness, malaise, arm pain, neck pain, trauma or fevers,  ROS: See pertinent positives and negatives per HPI.  Past Medical History  Diagnosis Date  . Asthma   . Anxiety   . Asthma   . Morbid obesity ([place])     sees weight loss clinic  . Hyperlipemia   . Hyperglycemia   . Nevus     sees dermatologist, Dr. Tonia Brooms    Past Surgical History  Procedure Laterality Date  . Finger laceration Right 2008    2nd finger  . Other surgical history Left [id number]     Implanon  . Wisdom tooth extraction    . Mole removal    . Implanon insertion  06-26-11    left upper arm    Family History  Problem Relation Age of Onset  . Atrial fibrillation Mother   . Diabetes Father   . Cancer Father     skin  . Polycystic ovary syndrome Sister   . Depression Sister   . Anxiety disorder Sister   . Bipolar disorder Sister   . Cancer Maternal Grandfather     skin  . Dementia Maternal Grandmother     Social History   Social History  . Marital Status: Married    Spouse Name: Mortimer Fries  . Number of Children: 0  . Years of Education: 40   Occupational History  . LEGAL ASSISTANT    . [place] History Main Topics  . Smoking status: Never Smoker   . Smokeless tobacco: Never Used  . Alcohol Use: No  . Drug Use: No  . Sexual Activity:    Partners: Male    Birth Control/ Protection: Pill   Other Topics Concern  . None   Social History Narrative      Marital Status: Married Chief Operating Officer)    Children:  None    Pets:  5 dogs   Living Situation: Lives with his husband.   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  DULoxetine (CYMBALTA) 60 MG capsule, Take 1 capsule (60 mg total) by mouth daily., Disp: 30 capsule, Rfl: 2 .  ibuprofen (ADVIL,MOTRIN) 200 MG tablet, Take 3 tablets (600 mg total) by mouth every 6 (six) hours as needed for moderate pain., Disp: 30 tablet, Rfl: 0 .  magnesium citrate solution, Take 296 mLs by mouth once., Disp: , Rfl:  .  NON FORMULARY, Formula one, Disp: , Rfl:  .  NON FORMULARY, Hcg/methylcobalamin/ionstil ([facility] weight loss program), Disp: , Rfl:  .  norethindrone-ethinyl estradiol (MICROGESTIN,JUNEL,LOESTRIN) 1-20 MG-MCG tablet, Take 1 tablet by mouth daily., Disp: 3 Package, Rfl: 4 .  omeprazole (PRILOSEC) 20 MG capsule, Take 1 capsule (20 mg total) by mouth daily., Disp: 90 capsule, Rfl: 3 .  Pyridoxine HCl (VITAMIN B-6 PO), Take by mouth., Disp: , Rfl:  EXAM:  Filed Vitals:   07/04/15 1309  BP: 100/80  Pulse: 124  Temp: 98.3 F (36.8 C)    Body mass index is 53.38 kg/(m^2).  GENERAL: vitals reviewed and listed above, alert, oriented, appears well hydrated and in no acute distress  HEENT: atraumatic, conjunttiva clear, no obvious abnormalities on inspection of external nose and ears  NECK: no obvious masses on inspection  LUNGS: clear to auscultation bilaterally, no wheezes, rales or rhonchi, good air movement  CV: HRRR, no peripheral edema  MS: moves all extremities without noticeable abnormality,   Normal inspection of the hands, wrists, and forearms; some tenderness to palpation and radial L hand,  Positive Tinel's on the left,  Normal strength and sensation to light touch in hands and arms. Normal  radial pulses.  PSYCH: pleasant and cooperative, no obvious depression or anxiety  ASSESSMENT AND PLAN:  Discussed the following assessment and plan:  Carpal tunnel syndrome of left wrist  - suspect mild early  CTS -Discussed treatment options and she decided to try cockup brace, this was applied and fitted and she was instructed to wear this at night and during the day as tolerated - follow up in 4-6 weeks -Patient advised to return or notify a doctor immediately if symptoms worsen or persist or new concerns arise.  There are no Patient Instructions on file for this visit.   Colin Benton R.

## 2015-07-06 MED FILL — DULoxetine HCL 60 MG CPEP: 60 | 30 days supply | Qty: 30 | Fill #2

## 2015-07-06 MED FILL — OMEPRAZOLE DR 20 MG CAPSULE: 20 | 90 days supply | Qty: 90 | Fill #3

## 2015-07-19 ENCOUNTER — Ambulatory Visit (HOSPITAL_COMMUNITY): Payer: Self-pay | Admitting: Psychiatry

## 2015-08-02 ENCOUNTER — Ambulatory Visit (INDEPENDENT_AMBULATORY_CARE_PROVIDER_SITE_OTHER): Payer: 59 | Admitting: Family Medicine

## 2015-08-02 ENCOUNTER — Encounter: Payer: Self-pay | Admitting: Family Medicine

## 2015-08-02 ENCOUNTER — Ambulatory Visit: Payer: Self-pay | Admitting: Family Medicine

## 2015-08-02 VITALS — BP 104/70 | HR 90 | Temp 98.7°F | Ht 68.0 in | Wt 353.8 lb

## 2015-08-02 DIAGNOSIS — J069 Acute upper respiratory infection, unspecified: Secondary | ICD-10-CM

## 2015-08-02 MED ORDER — AMOXICILLIN-POT CLAVULANATE 875-125 MG PO TABS: 1.0000 | ORAL_TABLET | Freq: Two times a day (BID) | ORAL | Status: DC

## 2015-08-02 NOTE — Patient Instructions (Signed)
INSTRUCTIONS FOR UPPER RESPIRATORY INFECTION:  -plenty of rest and fluids  -if not improving with other measures or worsening as we discussed please take the antibiotic  -nasal saline wash 2-3 times daily (use prepackaged nasal saline or bottled/distilled water if making your own)   -can use AFRIN nasal spray for drainage and nasal congestion - but do NOT use longer then 3-4 days  -can use tylenol (in no history of liver disease) or ibuprofen (if no history of kidney disease, bowel bleeding or significant heart disease) as directed for aches and sorethroat  -in the winter time, using a humidifier at night is helpful (please follow cleaning instructions)  -if you are taking a cough medication - use only as directed, may also try a teaspoon of honey to coat the throat and throat lozenges. If given a cough medication with codeine or hydrocodone or other narcotic please be advised that this contains a strong and  potentially addicting medication. Please follow instructions carefully, take as little as possible and only use AS NEEDED for severe cough. Discuss potential side effects with your pharmacy. Please do not drive or operate machinery while taking these types of medications. Please do not take other sedating medications, drugs or alcohol while taking this medication without discussing with your doctor.  -for sore throat, salt water gargles can help  -follow up if you have fevers, facial pain, tooth pain, difficulty breathing or are worsening or symptoms persist longer then expected  Upper Respiratory Infection, Adult An upper respiratory infection (URI) is also known as the common cold. It is often caused by a type of germ (virus). Colds are easily spread (contagious). You can pass it to others by kissing, coughing, sneezing, or drinking out of the same glass. Usually, you get better in 1 to 3  weeks.  However, the cough can last for even longer. HOME CARE   Only take medicine as told by  your doctor. Follow instructions provided above.  Drink enough water and fluids to keep your pee (urine) clear or pale yellow.  Get plenty of rest.  Return to work when your temperature is < 100 for 24 hours or as told by your doctor. You may use a face mask and wash your hands to stop your cold from spreading. GET HELP RIGHT AWAY IF:   After the first few days, you feel you are getting worse.  You have questions about your medicine.  You have chills, shortness of breath, or red spit (mucus).  You have pain in the face for more then 1-2 days, especially when you bend forward.  You have a fever, puffy (swollen) neck, pain when you swallow, or white spots in the back of your throat.  You have a bad headache, ear pain, sinus pain, or chest pain.  You have a high-pitched whistling sound when you breathe in and out (wheezing).  You cough up blood.  You have sore muscles or a stiff neck. MAKE SURE YOU:   Understand these instructions.  Will watch your condition.  Will get help right away if you are not doing well or get worse. Document Released: 10/10/2007 Document Revised: 07/16/2011 Document Reviewed: 07/29/2013 [facility] Patient Information 2015 [place], [place]. This information is not intended to replace advice given to you by your health care provider. Make sure you discuss any questions you have with your health care provider.

## 2015-08-02 NOTE — Progress Notes (Signed)
Pre visit review using our clinic review tool, if applicable. No additional management support is needed unless otherwise documented below in the visit note. 

## 2015-08-02 NOTE — Progress Notes (Signed)
HPI:  URI: -started: 2 weeks ago -symptoms:nasal congestion - thick and green, PND, cough, ear pressure -denies:fever, SOB, NVD, tooth pain, sinus pain -has tried: nothing -sick contacts/travel/risks: no reported flu, strep or tick exposure -Hx of: allergies and mild asthma - no sig asthma symptoms with this  ROS: See pertinent positives and negatives per HPI.  Past Medical History  Diagnosis Date  . Asthma   . Anxiety   . Asthma   . Morbid obesity ([place])     sees weight loss clinic  . Hyperlipemia   . Hyperglycemia   . Nevus     sees dermatologist, Dr. Tonia Brooms    Past Surgical History  Procedure Laterality Date  . Finger laceration Right 2008    2nd finger  . Other surgical history Left [id number]     Implanon  . Wisdom tooth extraction    . Mole removal    . Implanon insertion  06-26-11    left upper arm    Family History  Problem Relation Age of Onset  . Atrial fibrillation Mother   . Diabetes Father   . Cancer Father     skin  . Polycystic ovary syndrome Sister   . Depression Sister   . Anxiety disorder Sister   . Bipolar disorder Sister   . Cancer Maternal Grandfather     skin  . Dementia Maternal Grandmother     Social History   Social History  . Marital Status: Married    Spouse Name: Mortimer Fries  . Number of Children: 0  . Years of Education: 12   Occupational History  . LEGAL ASSISTANT    . [place] History Main Topics  . Smoking status: Never Smoker   . Smokeless tobacco: Never Used  . Alcohol Use: No  . Drug Use: No  . Sexual Activity:    Partners: Male    Birth Control/ Protection: Pill   Other Topics Concern  . None   Social History Narrative      Marital Status: Married Chief Operating Officer)    Children:  None    Pets:  5 dogs   Living Situation: Lives with his husband.   Occupation: Civil Service fast streamer     Education: Forensic psychologist ([place]) [place])    Alcohol Use:  Occasional   Tobacco Use:  None    Drug Use:  None   Diet:  Regular   Exercise:  None   Hobbies:  Reading, Movies                  Current outpatient prescriptions:  .  DULoxetine (CYMBALTA) 60 MG capsule, Take 1 capsule (60 mg total) by mouth daily., Disp: 30 capsule, Rfl: 2 .  ibuprofen (ADVIL,MOTRIN) 200 MG tablet, Take 3 tablets (600 mg total) by mouth every 6 (six) hours as needed for moderate pain., Disp: 30 tablet, Rfl: 0 .  magnesium citrate solution, Take 296 mLs by mouth once., Disp: , Rfl:  .  NON FORMULARY, Formula one, Disp: , Rfl:  .  NON FORMULARY, Hcg/methylcobalamin/ionstil ([facility] weight loss program), Disp: , Rfl:  .  norethindrone-ethinyl estradiol (MICROGESTIN,JUNEL,LOESTRIN) 1-20 MG-MCG tablet, Take 1 tablet by mouth daily., Disp: 3 Package, Rfl: 4 .  omeprazole (PRILOSEC) 20 MG capsule, Take 1 capsule (20 mg total) by mouth daily., Disp: 90 capsule, Rfl: 3 .  Pyridoxine HCl (VITAMIN B-6 PO), Take by mouth., Disp: , Rfl:  .  amoxicillin-clavulanate (AUGMENTIN) 875-125  MG tablet, Take 1 tablet by mouth 2 (two) times daily., Disp: 20 tablet, Rfl: 0  EXAM:  Filed Vitals:   08/02/15 0914  BP: 104/70  Pulse: 90  Temp: 98.7 F (37.1 C)    Body mass index is 53.81 kg/(m^2).  GENERAL: vitals reviewed and listed above, alert, oriented, appears well hydrated and in no acute distress  HEENT: atraumatic, conjunttiva clear, no obvious abnormalities on inspection of external nose and ears, normal appearance of ear canals and TMs, clear nasal congestion, mild post oropharyngeal erythema with PND, no tonsillar edema or exudate, no sinus TTP  NECK: no obvious masses on inspection  LUNGS: clear to auscultation bilaterally, no wheezes, rales or rhonchi, good air movement  CV: HRRR, no peripheral edema  MS: moves all extremities without noticeable abnormality  PSYCH: pleasant and cooperative, no obvious depression or anxiety  ASSESSMENT AND PLAN:  Discussed the following  assessment and plan:  Upper respiratory infection  -given HPI and exam findings today, a serious infection or illness is unlikely. We discussed potential etiologies, with VURI being most likely, possible developing sinusitis but no alarm symptoms. We discussed treatment side effects, likely course, antibiotic misuse, transmission, and signs of developing a serious illness. She opted to try a short course of nasal decongestant after discussion of risks. If worsening or not improving she will start an antibiotic for bacterial sinusitis. She -of course, we advised to return or notify a doctor immediately if symptoms worsen or persist or new concerns arise.    Patient Instructions  INSTRUCTIONS FOR UPPER RESPIRATORY INFECTION:  -plenty of rest and fluids  -if not improving with other measures or worsening as we discussed please take the antibiotic  -nasal saline wash 2-3 times daily (use prepackaged nasal saline or bottled/distilled water if making your own)   -can use AFRIN nasal spray for drainage and nasal congestion - but do NOT use longer then 3-4 days  -can use tylenol (in no history of liver disease) or ibuprofen (if no history of kidney disease, bowel bleeding or significant heart disease) as directed for aches and sorethroat  -in the winter time, using a humidifier at night is helpful (please follow cleaning instructions)  -if you are taking a cough medication - use only as directed, may also try a teaspoon of honey to coat the throat and throat lozenges. If given a cough medication with codeine or hydrocodone or other narcotic please be advised that this contains a strong and  potentially addicting medication. Please follow instructions carefully, take as little as possible and only use AS NEEDED for severe cough. Discuss potential side effects with your pharmacy. Please do not drive or operate machinery while taking these types of medications. Please do not take other sedating  medications, drugs or alcohol while taking this medication without discussing with your doctor.  -for sore throat, salt water gargles can help  -follow up if you have fevers, facial pain, tooth pain, difficulty breathing or are worsening or symptoms persist longer then expected  Upper Respiratory Infection, Adult An upper respiratory infection (URI) is also known as the common cold. It is often caused by a type of germ (virus). Colds are easily spread (contagious). You can pass it to others by kissing, coughing, sneezing, or drinking out of the same glass. Usually, you get better in 1 to 3  weeks.  However, the cough can last for even longer. HOME CARE   Only take medicine as told by your doctor. Follow instructions provided above.  Drink enough water and fluids to keep your pee (urine) clear or pale yellow.  Get plenty of rest.  Return to work when your temperature is < 100 for 24 hours or as told by your doctor. You may use a face mask and wash your hands to stop your cold from spreading. GET HELP RIGHT AWAY IF:   After the first few days, you feel you are getting worse.  You have questions about your medicine.  You have chills, shortness of breath, or red spit (mucus).  You have pain in the face for more then 1-2 days, especially when you bend forward.  You have a fever, puffy (swollen) neck, pain when you swallow, or white spots in the back of your throat.  You have a bad headache, ear pain, sinus pain, or chest pain.  You have a high-pitched whistling sound when you breathe in and out (wheezing).  You cough up blood.  You have sore muscles or a stiff neck. MAKE SURE YOU:   Understand these instructions.  Will watch your condition.  Will get help right away if you are not doing well or get worse. Document Released: 10/10/2007 Document Revised: 07/16/2011 Document Reviewed: 07/29/2013 [facility] Patient Information 2015 [place], [place]. This information is not intended to  replace advice given to you by your health care provider. Make sure you discuss any questions you have with your health care provider.      Colin Benton R.

## 2015-08-08 ENCOUNTER — Ambulatory Visit: Payer: Self-pay | Admitting: Family Medicine

## 2015-08-16 MED FILL — DULoxetine HCL 60 MG CPEP: 60 | 30 days supply | Qty: 30 | Fill #0

## 2015-09-05 MED FILL — NORETHIND-ETH ESTRAD 1-0.02: 1-20 | 63 days supply | Qty: 63 | Fill #2

## 2015-09-21 MED FILL — DULoxetine HCL 60 MG CPEP: 60 | 30 days supply | Qty: 30 | Fill #1

## 2015-09-30 ENCOUNTER — Encounter: Payer: Self-pay | Admitting: Family Medicine

## 2015-09-30 ENCOUNTER — Ambulatory Visit (INDEPENDENT_AMBULATORY_CARE_PROVIDER_SITE_OTHER): Payer: 59 | Admitting: Family Medicine

## 2015-09-30 VITALS — BP 118/72 | HR 117 | Temp 98.3°F | Ht 68.0 in | Wt 366.1 lb

## 2015-09-30 DIAGNOSIS — R0683 Snoring: Secondary | ICD-10-CM | POA: Diagnosis not present

## 2015-09-30 NOTE — Progress Notes (Signed)
Pre visit review using our clinic review tool, if applicable. No additional management support is needed unless otherwise documented below in the visit note. 

## 2015-09-30 NOTE — Patient Instructions (Signed)
-  We placed a referral for you as discussed to the sleep specialist. It usually takes about 1-2 weeks to process and schedule this referral. If you have not heard from [place] regarding this appointment in 2 weeks please contact our office.

## 2015-09-30 NOTE — Progress Notes (Signed)
HPI:  Mary Brandt is here for an acute visit for concern for obstructive sleep apnea. She reports a history of snoring for a very long time. Her mother-in-law was concerned that she may have sleep apnea after hearing her snoring and her sleep on a vacation. She denies known apneic spells, unrestful sleep or daytime somnolence. No history of tonsillectomy or adenoidectomy. Her husband sleeps through her snoring.  ROS: See pertinent positives and negatives per HPI.  Past Medical History  Diagnosis Date  . Asthma   . Anxiety   . Asthma   . Morbid obesity ([place])     sees weight loss clinic  . Hyperlipemia   . Hyperglycemia   . Nevus     sees dermatologist, Dr. Tonia Brooms    Past Surgical History  Procedure Laterality Date  . Finger laceration Right 2008    2nd finger  . Other surgical history Left [id number]     Implanon  . Wisdom tooth extraction    . Mole removal    . Implanon insertion  06-26-11    left upper arm    Family History  Problem Relation Age of Onset  . Atrial fibrillation Mother   . Diabetes Father   . Cancer Father     skin  . Polycystic ovary syndrome Sister   . Depression Sister   . Anxiety disorder Sister   . Bipolar disorder Sister   . Cancer Maternal Grandfather     skin  . Dementia Maternal Grandmother     Social History   Social History  . Marital Status: Married    Spouse Name: Mortimer Fries  . Number of Children: 0  . Years of Education: 48   Occupational History  . LEGAL ASSISTANT    . [place] History Main Topics  . Smoking status: Never Smoker   . Smokeless tobacco: Never Used  . Alcohol Use: No  . Drug Use: No  . Sexual Activity:    Partners: Male    Birth Control/ Protection: Pill   Other Topics Concern  . None   Social History Narrative      Marital Status: Married Chief Operating Officer)    Children:  None    Pets:  5 dogs   Living Situation: Lives with his husband.   Occupation: Civil Service fast streamer      Education: Forensic psychologist ([place]) [place])    Alcohol Use:  Occasional   Tobacco Use:  None    Drug Use:  None   Diet:  Regular   Exercise:  None   Hobbies:  Reading, Movies                  Current outpatient prescriptions:  .  DULoxetine (CYMBALTA) 60 MG capsule, Take 1 capsule (60 mg total) by mouth daily., Disp: 30 capsule, Rfl: 2 .  ibuprofen (ADVIL,MOTRIN) 200 MG tablet, Take 3 tablets (600 mg total) by mouth every 6 (six) hours as needed for moderate pain., Disp: 30 tablet, Rfl: 0 .  magnesium citrate solution, Take 296 mLs by mouth once., Disp: , Rfl:  .  NON FORMULARY, Formula one, Disp: , Rfl:  .  NON FORMULARY, Hcg/methylcobalamin/ionstil ([facility] weight loss program), Disp: , Rfl:  .  norethindrone-ethinyl estradiol (MICROGESTIN,JUNEL,LOESTRIN) 1-20 MG-MCG tablet, Take 1 tablet by mouth daily., Disp: 3 Package, Rfl: 4 .  omeprazole (PRILOSEC) 20 MG capsule, Take 1 capsule (20 mg total) by mouth daily., Disp: 90 capsule,  Rfl: 3 .  Pyridoxine HCl (VITAMIN B-6 PO), Take by mouth., Disp: , Rfl:   EXAM:  Filed Vitals:   09/30/15 1103  BP: 118/72  Pulse: 117  Temp: 98.3 F (36.8 C)    Body mass index is 55.68 kg/(m^2).  GENERAL: vitals reviewed and listed above, alert, oriented, appears well hydrated and in no acute distress, morbid obesity  HEENT: atraumatic, conjunttiva clear, no obvious abnormalities on inspection of external nose and ears  NECK: no obvious masses on inspection, large neck  LUNGS: clear to auscultation bilaterally, no wheezes, rales or rhonchi, good air movement  CV: HRRR, no peripheral edema  MS: moves all extremities without noticeable abnormality  PSYCH: pleasant and cooperative, no obvious depression or anxiety  ASSESSMENT AND PLAN:  Discussed the following assessment and plan:  Snoring - Plan: Ambulatory referral to Pulmonology  Morbid obesity, unspecified obesity type ([place]) - Plan: Ambulatory referral  to Pulmonology  -Discussed options for evaluation and treatment for obstructive sleep apnea -She decided to see a sleep specialist for this and a referral was placed -Advised weight reduction, healthy diet and regular aerobic exercise -Patient advised to return or notify a doctor immediately if symptoms worsen or persist or new concerns arise.  Patient Instructions  -We placed a referral for you as discussed to the sleep specialist. It usually takes about 1-2 weeks to process and schedule this referral. If you have not heard from [place] regarding this appointment in 2 weeks please contact our office.      Colin Benton R.

## 2015-10-31 MED FILL — DULoxetine HCL 60 MG CPEP: 60 | 30 days supply | Qty: 30 | Fill #2

## 2015-11-02 ENCOUNTER — Ambulatory Visit: Payer: Self-pay | Admitting: Family Medicine

## 2015-11-21 ENCOUNTER — Other Ambulatory Visit: Payer: Self-pay | Admitting: Family Medicine

## 2015-11-29 ENCOUNTER — Other Ambulatory Visit (HOSPITAL_COMMUNITY): Payer: Self-pay

## 2015-11-29 ENCOUNTER — Telehealth: Payer: Self-pay | Admitting: *Deleted

## 2015-11-29 NOTE — Telephone Encounter (Signed)
Mary Brandt gave me a paper copy of a message from Mary Brandt stating her [place] and she wanted to know if Dr Maudie Mercury could see her and fill [place] Rx for Cymbalta.  Dr Maudie Mercury stated she would see Mary Brandt and I called Mary Brandt and left a detailed message that I scheduled an appt for her to see Dr Maudie Mercury on Thursday at 11am as there are not many openings left at this time and asked that she call back to cancel this if this is not a good time for her.

## 2015-11-29 NOTE — Telephone Encounter (Signed)
Arfeen patient is calling for a refill on her Cymbalta. She was last seen in December and did not have an appointment scheduled. I called patient to discuss setting up a follow up, I explained that she has not been seen since Dec. And she might not get a refill. Patient became upset stating that she needed her medication. I agreed to ask you if we can refill and transferred patient to the front desk to make a follow up appt asap, Please review and advise, thank you

## 2015-12-01 ENCOUNTER — Encounter: Payer: Self-pay | Admitting: Family Medicine

## 2015-12-01 ENCOUNTER — Ambulatory Visit (INDEPENDENT_AMBULATORY_CARE_PROVIDER_SITE_OTHER): Payer: 59 | Admitting: Family Medicine

## 2015-12-01 VITALS — BP 102/76 | HR 98 | Temp 99.1°F | Ht 68.0 in | Wt 360.7 lb

## 2015-12-01 DIAGNOSIS — D509 Iron deficiency anemia, unspecified: Secondary | ICD-10-CM

## 2015-12-01 DIAGNOSIS — R739 Hyperglycemia, unspecified: Secondary | ICD-10-CM | POA: Diagnosis not present

## 2015-12-01 DIAGNOSIS — F411 Generalized anxiety disorder: Secondary | ICD-10-CM

## 2015-12-01 DIAGNOSIS — E785 Hyperlipidemia, unspecified: Secondary | ICD-10-CM

## 2015-12-01 DIAGNOSIS — E611 Iron deficiency: Secondary | ICD-10-CM

## 2015-12-01 MED ORDER — DULOXETINE HCL 60 MG PO CPEP: 60.0000 mg | ORAL_CAPSULE | Freq: Every day | ORAL | 1 refills | Status: DC

## 2015-12-01 MED FILL — DULoxetine HCL 60 MG CPEP: 60 | 90 days supply | Qty: 90 | Fill #0

## 2015-12-01 MED FILL — NORETHIND-ETH ESTRAD 1-0.02: 1-20 | 84 days supply | Qty: 63 | Fill #3

## 2015-12-01 MED FILL — OMEPRAZOLE DR 20 MG CAPSULE: 20 | 90 days supply | Qty: 90 | Fill #0

## 2015-12-01 NOTE — Progress Notes (Signed)
HPI:  GAD: -dx 2011 when finishing law school -never panic or manic symptoms, never depression, never thoughts of self harm or SI per her report, never hospitalized -seeing psych and on Cymbalta 60mg  daily for 6 years -running out and psychiatrist refused to fill and can't see her until August -wonders if I can provide temporary refil  HLD/Obesity/Hyperglycemia: -has been working on lifestyle for this  Low iron: -is a blood donor and reports told her iron is mildly low - check this week -no heavy bleeding, malaise, weakness, change in bowels, fever, palpitations, hx blood disorders  ROS: See pertinent positives and negatives per HPI.  Past Medical History:  Diagnosis Date  . Anxiety   . Asthma   . Asthma   . Hyperglycemia   . Hyperlipemia   . Morbid obesity ([place])    sees weight loss clinic  . Nevus    sees dermatologist, Dr. Tonia Brooms    Past Surgical History:  Procedure Laterality Date  . finger laceration Right 2008   2nd finger  . implanon insertion  06-26-11   left upper arm  . MOLE REMOVAL    . OTHER SURGICAL HISTORY Left [id number]    Implanon  . WISDOM TOOTH EXTRACTION      Family History  Problem Relation Age of Onset  . Atrial fibrillation Mother   . Diabetes Father   . Cancer Father     skin  . Polycystic ovary syndrome Sister   . Depression Sister   . Anxiety disorder Sister   . Bipolar disorder Sister   . Cancer Maternal Grandfather     skin  . Dementia Maternal Grandmother     Social History   Social History  . Marital status: Married    Spouse name: Mortimer Fries  . Number of children: 0  . Years of education: 46   Occupational History  . LEGAL ASSISTANT    . [place] History Main Topics  . Smoking status: Never Smoker  . Smokeless tobacco: Never Used  . Alcohol use No  . Drug use: No  . Sexual activity: Yes    Partners: Male    Birth control/ protection: Pill   Other Topics Concern  . None   Social  History Narrative      Marital Status: Married Chief Operating Officer)    Children:  None    Pets:  5 dogs   Living Situation: Lives with his husband.   Occupation: Civil Service fast streamer     Education: Forensic psychologist ([place]) [place])    Alcohol Use:  Occasional   Tobacco Use:  None    Drug Use:  None   Diet:  Regular   Exercise:  None   Hobbies:  Reading, Movies                  Current Outpatient Prescriptions:  .  DULoxetine (CYMBALTA) 60 MG capsule, Take 1 capsule (60 mg total) by mouth daily., Disp: 90 capsule, Rfl: 1 .  ibuprofen (ADVIL,MOTRIN) 200 MG tablet, Take 3 tablets (600 mg total) by mouth every 6 (six) hours as needed for moderate pain., Disp: 30 tablet, Rfl: 0 .  magnesium citrate solution, Take 296 mLs by mouth once., Disp: , Rfl:  .  NON FORMULARY, Formula one, Disp: , Rfl:  .  NON FORMULARY, Hcg/methylcobalamin/ionstil ([facility] weight loss program), Disp: , Rfl:  .  norethindrone-ethinyl estradiol (MICROGESTIN,JUNEL,LOESTRIN) 1-20 MG-MCG tablet, Take 1 tablet by mouth daily.,  Disp: 3 Package, Rfl: 4 .  omeprazole (PRILOSEC) 20 MG capsule, TAKE 1 CAPSULE BY MOUTH ONCE DAILY, Disp: 90 capsule, Rfl: 0 .  Pyridoxine HCl (VITAMIN B-6 PO), Take by mouth., Disp: , Rfl:   EXAM:  Vitals:   12/01/15 1106  BP: 102/76  Pulse: 98  Temp: 99.1 F (37.3 C)    Body mass index is 54.84 kg/m.  GENERAL: vitals reviewed and listed above, alert, oriented, appears well hydrated and in no acute distress  HEENT: atraumatic, conjunttiva clear, no obvious abnormalities on inspection of external nose and ears  NECK: no obvious masses on inspection  LUNGS: clear to auscultation bilaterally, no wheezes, rales or rhonchi, good air movement  CV: HRRR, no peripheral edema  MS: moves all extremities without noticeable abnormality  PSYCH: pleasant and cooperative, no obvious depression or anxiety  ASSESSMENT AND PLAN:  Discussed the following assessment and  plan:  GAD (generalized anxiety disorder)  Hyperglycemia  Morbid obesity, unspecified obesity type ([facility])  Hyperlipemia  Generalized anxiety disorder - Plan: DULoxetine (CYMBALTA) 60 MG capsule  Low serum iron  -we will take over her cymbalta and she plans to cancel further follow up with her psychiatrist -lifestyle recs -oral liquid iron -recheck cbc/iron with physical with me or gyn -Patient advised to return or notify a doctor immediately if symptoms worsen or persist or new concerns arise.  Patient Instructions  BEFORE YOU LEAVE: -follow up: CPE in 3-4 months; come fasting if you can and it is convenient  I sent the prescription to your pharmacy  We recommend the following healthy lifestyle: 1) Small portions - eat off of salad plate instead of dinner plate 2) Eat a healthy clean diet with avoidance of (less then 1 serving per week) processed foods, sweetened drinks, white starches, red meat, fast foods and sweets and consisting of: * 5-9 servings per day of fresh or frozen fruits and vegetables (not corn or potatoes, not dried or canned) *nuts and seeds, beans *olives and olive oil *small portions of lean meats such as fish and white chicken  *small portions of whole grains 3)Get at least 150 minutes of sweaty aerobic exercise per week 4)reduce stress - counseling, meditation, relaxation to balance other aspects of your life     Colin Benton R., DO

## 2015-12-01 NOTE — Patient Instructions (Signed)
BEFORE YOU LEAVE: -follow up: CPE in 3-4 months; come fasting if you can and it is convenient  I sent the prescription to your pharmacy  We recommend the following healthy lifestyle: 1) Small portions - eat off of salad plate instead of dinner plate 2) Eat a healthy clean diet with avoidance of (less then 1 serving per week) processed foods, sweetened drinks, white starches, red meat, fast foods and sweets and consisting of: * 5-9 servings per day of fresh or frozen fruits and vegetables (not corn or potatoes, not dried or canned) *nuts and seeds, beans *olives and olive oil *small portions of lean meats such as fish and white chicken  *small portions of whole grains 3)Get at least 150 minutes of sweaty aerobic exercise per week 4)reduce stress - counseling, meditation, relaxation to balance other aspects of your life

## 2015-12-01 NOTE — Telephone Encounter (Signed)
I called patient to let her know that the prescription of Cymbalta would be approved until her follow up, but patient had gone to her PCP and gotten a 6 month prescription. Patient asked me to cancel any future appointments she has, I relayed this to the front desk.

## 2015-12-01 NOTE — Progress Notes (Signed)
Pre visit review using our clinic review tool, if applicable. No additional management support is needed unless otherwise documented below in the visit note. 

## 2015-12-21 ENCOUNTER — Ambulatory Visit (INDEPENDENT_AMBULATORY_CARE_PROVIDER_SITE_OTHER): Payer: 59 | Admitting: Pulmonary Disease

## 2015-12-21 ENCOUNTER — Encounter: Payer: Self-pay | Admitting: Pulmonary Disease

## 2015-12-21 VITALS — BP 148/88 | HR 90 | Ht 68.0 in | Wt 358.0 lb

## 2015-12-21 DIAGNOSIS — G4719 Other hypersomnia: Secondary | ICD-10-CM | POA: Diagnosis not present

## 2015-12-21 NOTE — Patient Instructions (Addendum)
   It was a pleasure taking care of you today!  We will schedule you to have a sleep study to determine if you have sleep apnea.   We will get a home sleep test.  You will be instructed to come back to the office to get an apparatus to sleep with overnight.  Once we have the apparatus, it will usually take [place] 1-2 weeks to read the study and get back at you with results of the test.  Please give [place] a call in 2 weeks after your study if you do not hear back from [place].    If the sleep study is positive, we will order you a CPAP  machine.  Please call the office if you do NOT receive your machine in the next 1-2 weeks.   Please make sure you use your CPAP device everytime you sleep.  We will monitor the usage of your machine per your insurance requirement.  Your insurance company may take the machine from you if you are not using it regularly.   Please clean the mask, tubings, filter, water reservoir with soapy water every week.  Please use distilled water for the water reservoir.   Please call the office or your machine provider (DME company) if you are having issues with the device.    Return to clinic in 6-8 weeks with Dr. Murlean Iba or NP.

## 2015-12-21 NOTE — Progress Notes (Signed)
Subjective:    Patient ID: Mary Brandt, female    DOB: 09-Sep-1985, 30 y.o.   MRN: [id number]  HPI Pt. Presents to the office today for consultation for OSA at the request of Dr. Rodena Goldmann. Pt. States that her MIL noted that she has been snoring while asleep. She feels she has been snoring for years. She states she goes to bed anywhere between 11 pm and 3 am.She usually falls asleep right away.She usually awakens between 9 am and 11 am.Her weight has increased about 20 pounds in the last 2 years. She has a history of Asthma / reactive airway disease.She has had no issues with this. She does have an albuterol inhaler that she uses as needed when sick.She has not had to use this in over 2 years.She has no allergies, and is a never smoker.Family history includes skin cancer and father who has OSA and is currently using CPAP therapy. She states that she does not experience daytime sleepiness. She has recently started a new diet that is low carb, high fat and protein. Her blood pressure is elevated today, which is new. She is not aware of any increase in salt intake.She does tale Omeprazole 20 mg for GERD which she takes at varying times of the day.   Current Outpatient Prescriptions:  .  DULoxetine (CYMBALTA) 60 MG capsule, Take 1 capsule (60 mg total) by mouth daily., Disp: 90 capsule, Rfl: 1 .  ibuprofen (ADVIL,MOTRIN) 200 MG tablet, Take 3 tablets (600 mg total) by mouth every 6 (six) hours as needed for moderate pain., Disp: 30 tablet, Rfl: 0 .  norethindrone-ethinyl estradiol (MICROGESTIN,JUNEL,LOESTRIN) 1-20 MG-MCG tablet, Take 1 tablet by mouth daily., Disp: 3 Package, Rfl: 4 .  omeprazole (PRILOSEC) 20 MG capsule, TAKE 1 CAPSULE BY MOUTH ONCE DAILY, Disp: 90 capsule, Rfl: 0  Past Medical History:  Diagnosis Date  . Anxiety   . Asthma   . Asthma   . Hyperglycemia   . Hyperlipemia   . Morbid obesity ([place])    sees weight loss clinic  . Nevus    sees dermatologist, Dr. Tonia Brooms     Social History   Social History  . Marital status: Married    Spouse name: Mortimer Fries  . Number of children: 0  . Years of education: 46   Occupational History  . LEGAL ASSISTANT    . [place] History Main Topics  . Smoking status: Never Smoker  . Smokeless tobacco: Never Used  . Alcohol use No  . Drug use: No  . Sexual activity: Yes    Partners: Male    Birth control/ protection: Pill   Other Topics Concern  . None   Social History Narrative      Marital Status: Married Chief Operating Officer)    Children:  None    Pets:  5 dogs   Living Situation: Lives with his husband.   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   Review of Systems  Constitutional: Negative.  Negative for fever and unexpected weight change.  HENT: Negative.  Negative for congestion, dental problem, ear pain, nosebleeds, postnasal drip, rhinorrhea, sinus pressure, sneezing, sore throat and trouble swallowing.   Eyes: Negative.  Negative for redness and itching.  Respiratory: Negative.  Negative for cough, chest tightness, shortness of breath and wheezing.   Cardiovascular: Negative.  Negative for palpitations and leg swelling.  Gastrointestinal: Negative.  Negative for nausea and vomiting.  Endocrine: Negative.   Genitourinary: Negative.  Negative for dysuria.  Musculoskeletal: Negative.  Negative for joint swelling.  Skin: Negative.  Negative for rash.   Allergic/Immunologic: Negative.   Neurological: Negative.  Negative for headaches.  Hematological: Negative.  Does not bruise/bleed easily.  Psychiatric/Behavioral: Negative.  Negative for dysphoric mood. The patient is not nervous/anxious.        Objective:   Physical Exam BP (!) 148/88 (BP Location: Left Arm, Cuff Size: Normal)   Pulse 90   Ht 5\' 8"  (1.727 m)   Wt (!) 358 lb (162.4 kg)   SpO2 98%   BMI 54.43 kg/m   Physical Exam:  General- No distress,  A&Ox3, obese ENT: No sinus tenderness, TM clear, pale nasal mucosa, no oral exudate,no post nasal drip, no LAN Cardiac: S1, S2, regular rate and rhythm, no murmur Chest: No wheeze/ rales/ dullness; no accessory muscle use, no nasal flaring, no sternal retractions Abd.: Soft Non-tender Ext: No clubbing cyanosis, edema Neuro:  normal strength Skin: No rashes, warm and dry Psych: normal mood and behavior      Assessment & Plan:  Heavy Snoring  Plan: We will schedule you for a sleep study. We will call you with results. Continue to work on weight loss, as the link between excess weight  and sleep apnea is well established.  Do not drive if sleepy. Follow up with Dr. Corrie Dandy  After sleep study. Please contact office for sooner follow up if symptoms do not improve or worsen or seek emergency care    Magdalen Spatz, AGACNP-BC [facility] 12/21/2015  Thank you for the opportunity to participate in Ms. Siegert's care.    ATTENDING NOTE / ATTESTATION NOTE :   I have discussed the case with the resident/APP Eric Form.   I agree with the resident/APP's  history, physical examination, assessment, and plans.    I have edited the above note and modified it according to our agreed history, physical examination, assessment and plan.   Briefly, pt with sx of OSA - hypersomnia, snoring, fatigue, unrefreshed sleep.  Has family members with OSA.  Also has asthma which is stable. Has GERD which is  stable as well. 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 She denies getting sleepy driving. Her sleepiness affects her fxnality.   Assessment and Plan : 1. Hypersomnia. Plan for HST. If (-), will need to call pt. Even if mild, pt wants to try cpap. Depends on cost as well.   We discussed about the diagnosis of Obstructive Sleep Apnea (OSA) and implications of untreated OSA. We discussed about CPAP and BiPaP as possible treatment options.   We will schedule the patient for a sleep study.    Patient was instructed to call the office if he/she has not heard back from the office 1-2 weeks after the sleep study.   Patient was instructed to call  the office if he/she is having issues with the PAP device.   We discussed good sleep hygiene.   Patient was advised not to engage in activities requiring concentration and/or vigilance if he/she is sleepy.  Patient was advised not to drive if he/she is sleepy.   2. Asthma. Stable. PRN alb. 3. GERD. Advised to take PPI at HS. Diet changes.  4. Obesity. Weight reduction.    Thank you very much for letting me participate in this patient's care. Please do not hesitate to give me a call if you have any questions or concerns regarding the treatment plan.   Patient will follow up with me in 6-8 weeks.     Monica Becton, MD 12/21/2015   9:43 PM [facility] [place] Pager: [phone] Office: [phone], Fax: [phone]   J. Shirl Harris, MD 12/21/2015, 9:38 PM [facility] Pager [phone] After 3 pm or if no answer, call [phone]

## 2015-12-27 ENCOUNTER — Ambulatory Visit (HOSPITAL_COMMUNITY): Payer: Self-pay | Admitting: Psychiatry

## 2015-12-28 DIAGNOSIS — G4733 Obstructive sleep apnea (adult) (pediatric): Secondary | ICD-10-CM | POA: Diagnosis not present

## 2016-01-03 ENCOUNTER — Telehealth: Payer: Self-pay | Admitting: Pulmonary Disease

## 2016-01-03 DIAGNOSIS — G4733 Obstructive sleep apnea (adult) (pediatric): Secondary | ICD-10-CM

## 2016-01-03 NOTE — Telephone Encounter (Signed)
  Please call the pt and tell the pt the [place]  showed OSA  Pt stops breathing   13  times an hour.   Home sleep study was done on :  12/28/15  Please order autoCPAP 5-15 cm H2O. Patient will need a mask fitting session. Patient will need a 1 month download.   Patient needs to be seen by me or any of the NPs/APPs  4-6 weeks after obtaining the cpap machine. Let me know if you receive this.   Thanks!   J. Shirl Harris, MD 01/03/2016, 1:21 PM

## 2016-01-03 NOTE — Telephone Encounter (Signed)
Called and lmomtcb x 1 for pt .

## 2016-01-04 DIAGNOSIS — G4733 Obstructive sleep apnea (adult) (pediatric): Secondary | ICD-10-CM | POA: Diagnosis not present

## 2016-01-04 NOTE — Telephone Encounter (Signed)
[phone], pt cb

## 2016-01-04 NOTE — Telephone Encounter (Signed)
lmtcb for pt.  

## 2016-01-04 NOTE — Telephone Encounter (Signed)
[phone], pt cb

## 2016-01-04 NOTE — Telephone Encounter (Signed)
Spoke with pt. She is aware of sleep study results. Order has been placed for CPAP. Pt will call back and make appointment after CPAP is started. Nothing further was needed.

## 2016-01-06 ENCOUNTER — Other Ambulatory Visit: Payer: Self-pay | Admitting: *Deleted

## 2016-01-06 DIAGNOSIS — G4719 Other hypersomnia: Secondary | ICD-10-CM

## 2016-01-18 DIAGNOSIS — G4733 Obstructive sleep apnea (adult) (pediatric): Secondary | ICD-10-CM | POA: Diagnosis not present

## 2016-01-18 DIAGNOSIS — R0683 Snoring: Secondary | ICD-10-CM | POA: Diagnosis not present

## 2016-01-18 DIAGNOSIS — I10 Essential (primary) hypertension: Secondary | ICD-10-CM | POA: Diagnosis not present

## 2016-01-20 ENCOUNTER — Encounter: Payer: Self-pay | Admitting: Pulmonary Disease

## 2016-01-20 ENCOUNTER — Encounter: Payer: Self-pay | Admitting: Family Medicine

## 2016-02-17 DIAGNOSIS — I10 Essential (primary) hypertension: Secondary | ICD-10-CM | POA: Diagnosis not present

## 2016-02-17 DIAGNOSIS — G4733 Obstructive sleep apnea (adult) (pediatric): Secondary | ICD-10-CM | POA: Diagnosis not present

## 2016-02-17 DIAGNOSIS — R0683 Snoring: Secondary | ICD-10-CM | POA: Diagnosis not present

## 2016-02-21 ENCOUNTER — Encounter: Payer: Self-pay | Admitting: Pulmonary Disease

## 2016-02-21 NOTE — Telephone Encounter (Signed)
Spoke with pt over the phone about this matter. She is going to keep her appointment the same with AD. Nothing further was needed.

## 2016-02-28 ENCOUNTER — Encounter: Payer: Self-pay | Admitting: Certified Nurse Midwife

## 2016-02-28 ENCOUNTER — Ambulatory Visit (INDEPENDENT_AMBULATORY_CARE_PROVIDER_SITE_OTHER): Payer: 59 | Admitting: Certified Nurse Midwife

## 2016-02-28 VITALS — BP 112/80 | HR 68 | Resp 16 | Ht 68.25 in | Wt 370.0 lb

## 2016-02-28 DIAGNOSIS — Z23 Encounter for immunization: Secondary | ICD-10-CM | POA: Diagnosis not present

## 2016-02-28 DIAGNOSIS — Z1151 Encounter for screening for human papillomavirus (HPV): Secondary | ICD-10-CM | POA: Diagnosis not present

## 2016-02-28 DIAGNOSIS — Z0000 Encounter for general adult medical examination without abnormal findings: Secondary | ICD-10-CM

## 2016-02-28 DIAGNOSIS — Z124 Encounter for screening for malignant neoplasm of cervix: Secondary | ICD-10-CM | POA: Diagnosis not present

## 2016-02-28 DIAGNOSIS — Z308 Encounter for other contraceptive management: Secondary | ICD-10-CM | POA: Diagnosis not present

## 2016-02-28 DIAGNOSIS — N6320 Unspecified lump in the left breast, unspecified quadrant: Secondary | ICD-10-CM | POA: Diagnosis not present

## 2016-02-28 DIAGNOSIS — Z01419 Encounter for gynecological examination (general) (routine) without abnormal findings: Secondary | ICD-10-CM

## 2016-02-28 DIAGNOSIS — R350 Frequency of micturition: Secondary | ICD-10-CM | POA: Diagnosis not present

## 2016-02-28 LAB — POCT URINALYSIS DIPSTICK
BILIRUBIN UA: NEGATIVE
Glucose, UA: NEGATIVE
KETONES UA: NEGATIVE
NITRITE UA: NEGATIVE
PH UA: 5
Protein, UA: NEGATIVE
RBC UA: NEGATIVE
Urobilinogen, UA: NEGATIVE

## 2016-02-28 MED ORDER — NORETHINDRONE ACET-ETHINYL EST 1-20 MG-MCG PO TABS: 1.0000 | ORAL_TABLET | Freq: Every day | ORAL | 4 refills | Status: DC

## 2016-02-28 MED FILL — NORETHIND-ETH ESTRAD 1-0.02: 1-20 | 84 days supply | Qty: 84 | Fill #0

## 2016-02-28 NOTE — Progress Notes (Signed)
Scheduled patient while in office for left breast diagnostic mammogram with ultrasound at the [facility] on 03/06/2016 at 8:10 am. Patient is agreeable to date and time. Patient placed in mammogram hold.

## 2016-02-28 NOTE — Progress Notes (Signed)
30 y.o. G0P0000 Married  Caucasian Fe here for annual exam. Periods normal, no issues, now.  Sees Colin Benton MD for aex/labs, medication management for anxiety and GERD.  Was diagnosed with obstructive sleep apnea, using C-pap with no snoring now.  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 Has not been able to exercise and work on weight loss as before. Life changes and need to lose weight. Some urinary frequency, but no burning or pain. Denies any back pain or blood in urine. No other health issues today.  Patient's last menstrual period was 02/12/2016 (exact date).          Sexually active: Yes.    The current method of family planning is OCP (estrogen/progesterone).    Exercising: No.  not lately Smoker:  no  Health Maintenance: Pap:  02-18-14 neg MMG:  none Colonoscopy:  none BMD:   none TDaP:  2008 Shingles: no Pneumonia: no Hep C and HIV: not done Labs: poct urine-wbc 1+ Self breast exam: done monthly   reports that she has never smoked. She has never used smokeless tobacco. She reports that she does not drink alcohol or use drugs.  Past Medical History:  Diagnosis Date  . Anxiety   . Asthma   . Asthma   . Hyperglycemia   . Hyperlipemia   . Morbid obesity ([place])    sees weight loss clinic  . Nevus    sees dermatologist, Dr. Tonia Brooms  . Obstructive sleep apnea on CPAP     Past Surgical History:  Procedure Laterality Date  . finger laceration Right 2008   2nd finger  . implanon insertion  06-26-11   left upper arm  . MOLE REMOVAL    . OTHER SURGICAL HISTORY Left [id number]    Implanon  . WISDOM TOOTH EXTRACTION      Current Outpatient Prescriptions  Medication Sig Dispense Refill  . DULoxetine (CYMBALTA) 60 MG capsule Take 1 capsule (60 mg total) by mouth daily. 90 capsule 1  . ibuprofen (ADVIL,MOTRIN) 200 MG tablet Take 3 tablets (600 mg total) by mouth every 6 (six) hours as needed for moderate pain. 30 tablet 0  .  norethindrone-ethinyl estradiol (MICROGESTIN,JUNEL,LOESTRIN) 1-20 MG-MCG tablet Take 1 tablet by mouth daily. 3 Package 4  . omeprazole (PRILOSEC) 20 MG capsule TAKE 1 CAPSULE BY MOUTH ONCE DAILY 90 capsule 0   No current facility-administered medications for this visit.     Family History  Problem Relation Age of Onset  . Atrial fibrillation Mother   . Diabetes Father   . Cancer Father     skin  . Polycystic ovary syndrome Sister   . Depression Sister   . Anxiety disorder Sister   . Bipolar disorder Sister   . Cancer Maternal Grandfather     skin  . Dementia Maternal Grandmother     ROS:  Pertinent items are noted in HPI.  Otherwise, a comprehensive ROS was negative.  Exam:   BP 112/80   Pulse 68   Resp 16   Ht 5' 8.25" (1.734 m)   Wt (!) 370 lb (167.8 kg)   LMP 02/12/2016 (Exact Date)   BMI 55.85 kg/m  Height: 5' 8.25" (173.4 cm) Ht Readings from Last 3 Encounters:  02/28/16 5' 8.25" (1.734 m)  12/21/15 5\' 8"  (1.727 m)  12/01/15 5\' 8"  (1.727 m)    General appearance: alert, cooperative and appears stated age Head: Normocephalic, without obvious abnormality, atraumatic Neck: no adenopathy, supple, symmetrical,  trachea midline and thyroid normal to inspection and palpation Lungs: clear to auscultation bilaterally Breasts: normal appearance, no masses or tenderness, No nipple retraction or dimpling, No nipple discharge or bleeding, No axillary or supraclavicular adenopathy  Left breast pea size mass noted at 3 fb from breast shelf edge at 6-7 o'clock, non tender Heart: regular rate and rhythm Abdomen: soft, non-tender; no masses,  no organomegaly Extremities: extremities normal, atraumatic, no cyanosis or edema Skin: Skin color, texture, turgor normal. No rashes or lesions Lymph nodes: Cervical, supraclavicular, and axillary nodes normal. No abnormal inguinal nodes palpated Neurologic: Grossly normal   Pelvic: External genitalia:  no lesions              Urethra:   normal appearing urethra with no masses, tenderness or lesions              Bartholin's and Skene's: normal                 Vagina: normal appearing vagina with normal color and discharge, no lesions              Cervix: no bleeding following Pap, no cervical motion tenderness, no lesions and nulliparous appearance              Pap taken: Yes.   Bimanual Exam:  Uterus:  normal size, contour, position, consistency, mobility, non-tender and linited by body habitus              Adnexa: no mass, fullness, tenderness adnexa not palpated due to body habitus               Rectovaginal: Confirms               Anus:  normal sphincter tone, no lesions  Chaperone present: yes  A:  Well Woman with normal exam  Contraception OCP desired  Left breast mass  R/O UTI urinary frequency  1+ WBC in POCT urine  Anxiety/GERD with PCP management  Morbid obesity  Immunization due  P:   Reviewed health and wellness pertinent to exam  Rx Microgestin see order with instructions  Discussed finding and need for mammogram and [place], patient agreeable. Will schedule prior to leaving today.  Increase water intake and decrease soda, coffee and tea. Warning signs of UTI given and need to advise  Lab Urine micro.  Continue follow up with PCP as indicated.  Discussed working with Bariatric clinic for dietary weight loss. Has used [place] and lost before, plans to work on again to be able to try for pregnancy.  Requests TDAP  Pap smear as above with HPVHR   counseled on breast self exam, use and side effects of OCP's, adequate intake of calcium and vitamin D, diet and exercise  return annually or prn  An After Visit Summary was printed and given to the patient.

## 2016-02-28 NOTE — Patient Instructions (Signed)

## 2016-02-29 ENCOUNTER — Other Ambulatory Visit: Payer: Self-pay | Admitting: Certified Nurse Midwife

## 2016-02-29 DIAGNOSIS — N390 Urinary tract infection, site not specified: Secondary | ICD-10-CM

## 2016-02-29 LAB — URINALYSIS, MICROSCOPIC ONLY
Casts: NONE SEEN [LPF]
Crystals: NONE SEEN [HPF]
YEAST: NONE SEEN [HPF]

## 2016-02-29 MED ORDER — SULFAMETHOXAZOLE-TRIMETHOPRIM 800-160 MG PO TABS: 1.0000 | ORAL_TABLET | Freq: Two times a day (BID) | ORAL | 0 refills | Status: DC

## 2016-02-29 MED FILL — SULFAMETHOXAZOLE-TMP DS TAB: 800-160 | 3 days supply | Qty: 6 | Fill #0

## 2016-03-01 LAB — IPS PAP TEST WITH HPV

## 2016-03-02 ENCOUNTER — Encounter: Payer: Self-pay | Admitting: Family Medicine

## 2016-03-02 NOTE — Progress Notes (Signed)
Encounter reviewed Jill Jertson, MD   

## 2016-03-06 ENCOUNTER — Other Ambulatory Visit: Payer: Self-pay | Admitting: Certified Nurse Midwife

## 2016-03-06 ENCOUNTER — Ambulatory Visit
Admission: RE | Admit: 2016-03-06 | Discharge: 2016-03-06 | Disposition: A | Discharge status: Home / Self Care | Payer: 59 | Source: Ambulatory Visit | Attending: Certified Nurse Midwife | Admitting: Certified Nurse Midwife

## 2016-03-06 ENCOUNTER — Telehealth: Payer: Self-pay | Admitting: *Deleted

## 2016-03-06 ENCOUNTER — Other Ambulatory Visit: Payer: Self-pay | Admitting: *Deleted

## 2016-03-06 DIAGNOSIS — N6320 Unspecified lump in the left breast, unspecified quadrant: Secondary | ICD-10-CM

## 2016-03-06 DIAGNOSIS — N6324 Unspecified lump in the left breast, lower inner quadrant: Secondary | ICD-10-CM | POA: Diagnosis not present

## 2016-03-06 NOTE — Telephone Encounter (Signed)
Left message to call Ivorie Uplinger at [phone].  

## 2016-03-06 NOTE — Telephone Encounter (Signed)
-----   Message from Regina Eck, CNM sent at 03/06/2016 12:39 PM EDT ----- Reviewed [place] and diagnostic mammogram, are negative area palpated noted to fibroglandular tissue only. Will need follow up appointment to recheck in 2 weeks

## 2016-03-06 NOTE — Telephone Encounter (Signed)
Spoke with Tye Maryland at the breast center. They request new order for Bilateral Diagnostic MMG. Order placed by [place], needs co-signed by Melvia Heaps, CNM. Advised will notify Melvia Heaps, CNM.

## 2016-03-07 NOTE — Telephone Encounter (Signed)
Spoke with patient, advised as seen below per Melvia Heaps, CNM. Patient scheduled for recheck 03/22/16 at 0800. Patient is agreeable to date and time and verbalizes understanding.   Routing to provider for final review. Patient is agreeable to disposition. Will close encounter.

## 2016-03-19 DIAGNOSIS — I10 Essential (primary) hypertension: Secondary | ICD-10-CM | POA: Diagnosis not present

## 2016-03-19 DIAGNOSIS — R0683 Snoring: Secondary | ICD-10-CM | POA: Diagnosis not present

## 2016-03-19 DIAGNOSIS — G4733 Obstructive sleep apnea (adult) (pediatric): Secondary | ICD-10-CM | POA: Diagnosis not present

## 2016-03-20 ENCOUNTER — Encounter: Payer: Self-pay | Admitting: Pulmonary Disease

## 2016-03-22 ENCOUNTER — Ambulatory Visit (INDEPENDENT_AMBULATORY_CARE_PROVIDER_SITE_OTHER): Payer: 59 | Admitting: Pulmonary Disease

## 2016-03-22 ENCOUNTER — Encounter: Payer: Self-pay | Admitting: Pulmonary Disease

## 2016-03-22 ENCOUNTER — Ambulatory Visit (INDEPENDENT_AMBULATORY_CARE_PROVIDER_SITE_OTHER): Payer: 59 | Admitting: Certified Nurse Midwife

## 2016-03-22 ENCOUNTER — Encounter: Payer: Self-pay | Admitting: Certified Nurse Midwife

## 2016-03-22 VITALS — BP 102/74 | HR 70 | Resp 16 | Ht 68.25 in | Wt 368.0 lb

## 2016-03-22 DIAGNOSIS — Z1231 Encounter for screening mammogram for malignant neoplasm of breast: Secondary | ICD-10-CM

## 2016-03-22 DIAGNOSIS — Z1239 Encounter for other screening for malignant neoplasm of breast: Secondary | ICD-10-CM

## 2016-03-22 DIAGNOSIS — K219 Gastro-esophageal reflux disease without esophagitis: Secondary | ICD-10-CM | POA: Diagnosis not present

## 2016-03-22 DIAGNOSIS — R350 Frequency of micturition: Secondary | ICD-10-CM

## 2016-03-22 DIAGNOSIS — G4733 Obstructive sleep apnea (adult) (pediatric): Secondary | ICD-10-CM | POA: Insufficient documentation

## 2016-03-22 NOTE — Assessment & Plan Note (Signed)
Weight reduction 

## 2016-03-22 NOTE — Assessment & Plan Note (Signed)
Cont PPI daily.  Asp precautions

## 2016-03-22 NOTE — Patient Instructions (Signed)
  It was a pleasure taking care of you today!  Continue using your CPAP machine.   Please make sure you use your CPAP device everytime you sleep.  We will monitor the usage of your machine per your insurance requirement.  Your insurance company may take the machine from you if you are not using it regularly.   Please clean the mask, tubings, filter, water reservoir with soapy water every week.  Please use distilled water for the water reservoir.   Please call the office or your machine provider (DME company) if you are having issues with the device.   Return to clinic in 1 year  with Dr. De Dios    

## 2016-03-22 NOTE — Progress Notes (Signed)
   Subjective:   30 y.o. MarriedCaucasian female presents for follow up of ? Left breast mass. Patient had diagnostic mammogram and [place] on 03/06/16 and area of concern showed normal fibroglandular tissue with no concerns for malignancy. Patient here for recheck of area. Also patient still having some urinary frequency after treatment with Bactrim DS for symptomatic UTI. Has tried to increase water intake, very busy. 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 Patient denies dysuria, chills or fever or urgency. On menses today unable to assess with chemstrip. No other concerns  Review of Systems Pertinent items noted in HPI and remainder of comprehensive ROS otherwise negative.   Objective:   General appearance: alert, cooperative, appears stated age and no distress Declines pelvic exam Breasts: normal appearance, no masses or tenderness, No nipple retraction or dimpling, No nipple discharge or bleeding, No axillary or supraclavicular adenopathy, left breast at edge of breast small mobile tissue noted, no change, some ropey feel which corresponds with mammogram finding Noted at 6-7 o'clock . No pea size mass noted today.   Assessment:   ASSESSMENT:Patient is diagnosed with fibroglandular tissue of left breast with normal diagnostic mammogram and [place]. Normal breast exam today. R/O UTI, symptoms not resolved  Plan:   PLAN: The patient will continue to do SBE and will advise if any changes in this area. Encouraged increase in water intake, and start on cranberry capsules 2 /day to see if symptoms resolve. Warning signs of UTI given. Lab : urine culture  Rv prn

## 2016-03-22 NOTE — Assessment & Plan Note (Signed)
Patient was seen for snoring, hypersomnia, gasping, snoring. Has obesity. HST (August 2017) AHI 13. Auto CPAP 5-15 centimeters water.  Feels better using CPAP. More energy. Less sleepiness. Feels benefit of CPAP. Download compliance last month: 60%, AHI 1.1. She missed one week as she was sick.  Plan : We extensively discussed the importance of treating OSA and the need to use PAP therapy.   Continue with autocpap 5-15 cm water.    Patient was instructed to have mask, tubings, filter, reservoir cleaned at least once a week with soapy water.  Patient was instructed to call the office if he/she is having issues with the PAP device.    I advised patient to obtain sufficient amount of sleep --  7 to 8 hours at least in a 24 hr period.  Patient was advised to follow good sleep hygiene.  Patient was advised NOT to engage in activities requiring concentration and/or vigilance if he/she is and  sleepy.  Patient is NOT to drive if he/she is sleepy.

## 2016-03-22 NOTE — Progress Notes (Signed)
Subjective:    Patient ID: Mary Brandt, female    DOB: [birth year], 30 y.o.   MRN: [id number]  HPI Pt. Presents to the office today for consultation for OSA at the request of Dr. Rodena Goldmann. Pt. States that her MIL noted that she has been snoring while asleep. She feels she has been snoring for years. She states she goes to bed anywhere between 11 pm and 3 am.She usually falls asleep right away.She usually awakens between 9 am and 11 am.Her weight has increased about 20 pounds in the last 2 years. She has a history of Asthma / reactive airway disease.She has had no issues with this. She does have an albuterol inhaler that she uses as needed when sick.She has not had to use this in over 2 years.She has no allergies, and is a never smoker.Family history includes skin cancer and father who has OSA and is currently using CPAP therapy. She states that she does not experience daytime sleepiness. She has recently started a new diet that is low carb, high fat and protein. Her blood pressure is elevated today, which is new. She is not aware of any increase in salt intake.She does tale Omeprazole 20 mg for GERD which she takes at varying times of the day.  ROV 03/22/16 Patient returns to the office as follow-up on her sleep apnea. Since last seen, she had a sleep study done in August which showed AHI of 13. It was a home sleep study. She has gotten her CPAP machine. Download the last month: 60%, AHI 1.1. Feels better using it. More energy. Less sleepiness. No issues with it. Has not been admitted nor  Review of Systems  Constitutional: Negative.  Negative for fever and unexpected weight change.  HENT: Negative.  Negative for congestion, dental problem, ear pain, nosebleeds, postnasal drip, rhinorrhea, sinus pressure, sneezing, sore throat and trouble swallowing.   Eyes: Negative.  Negative for redness and itching.  Respiratory: Negative.  Negative for cough, chest tightness, shortness of breath and wheezing.    Cardiovascular: Negative.  Negative for palpitations and leg swelling.  Gastrointestinal: Negative.  Negative for nausea and vomiting.  Endocrine: Negative.   Genitourinary: Negative.  Negative for dysuria.  Musculoskeletal: Negative.  Negative for joint swelling.  Skin: Negative.  Negative for rash.  Allergic/Immunologic: Negative.   Neurological: Negative.  Negative for headaches.  Hematological: Negative.  Does not bruise/bleed easily.  Psychiatric/Behavioral: Negative.  Negative for dysphoric mood. The patient is not nervous/anxious.        Objective:   Physical Exam    Vitals:  Vitals:   03/22/16 0902  BP: 108/72  Pulse: (!) 104  SpO2: 96%  Weight: (!) 368 lb 9.6 oz (167.2 kg)  Height: 5\' 8"  (1.727 m)    Constitutional/General:  Pleasant, well-nourished, well-developed, not in any distress,  Comfortably seating.  Well kempt  Body mass index is 56.05 kg/m. Wt Readings from Last 3 Encounters:  03/22/16 (!) 368 lb 9.6 oz (167.2 kg)  03/22/16 (!) 368 lb (166.9 kg)  02/28/16 (!) 370 lb (167.8 kg)    HEENT: Pupils equal and reactive to light and accommodation. Anicteric sclerae. Normal nasal mucosa.   No oral  lesions,  mouth clear,  oropharynx clear, no postnasal drip. (-) Oral thrush. No dental caries.  Airway - Mallampati class III-IV  Neck: No masses. Midline trachea. No JVD, (-) LAD. (-) bruits appreciated.  Respiratory/Chest: Grossly normal chest. (-) deformity. (-) Accessory muscle use.  Symmetric expansion. (-)  Tenderness on palpation.  Resonant on percussion.  Diminished BS on both lower lung zones. (-) wheezing, crackles, rhonchi (-) egophony  Cardiovascular: Regular rate and  rhythm, heart sounds normal, no murmur or gallops, no peripheral edema  Gastrointestinal:  Normal bowel sounds. Soft, non-tender. No hepatosplenomegaly.  (-) masses.   Musculoskeletal:  Normal muscle tone. Normal gait.   Extremities: Grossly normal. (-) clubbing, cyanosis.    (-) edema  Skin: (-) rash,lesions seen.   Neurological/Psychiatric : alert, oriented to time, place, person. Normal mood and affect     Assessment & Plan:  OSA (obstructive sleep apnea) Patient was seen for snoring, hypersomnia, gasping, snoring. Has obesity. HST (August 2017) AHI 13. Auto CPAP 5-15 centimeters water.  Feels better using CPAP. More energy. Less sleepiness. Feels benefit of CPAP. Download compliance last month: 60%, AHI 1.1. She missed one week as she was sick.  Plan : We extensively discussed the importance of treating OSA and the need to use PAP therapy.   Continue with autocpap 5-15 cm water.    Patient was instructed to have mask, tubings, filter, reservoir cleaned at least once a week with soapy water.  Patient was instructed to call the office if he/she is having issues with the PAP device.    I advised patient to obtain sufficient amount of sleep --  7 to 8 hours at least in a 24 hr period.  Patient was advised to follow good sleep hygiene.  Patient was advised NOT to engage in activities requiring concentration and/or vigilance if he/she is and  sleepy.  Patient is NOT to drive if he/she is sleepy.     GERD (gastroesophageal reflux disease) Cont PPI daily.  Asp precautions  Morbid obesity Weight reduction    J. Shirl Harris, MD 03/22/2016   9:44 AM [facility] [place] Pager: [phone] Office: [phone], Fax: [phone]   J. Shirl Harris, MD 03/22/2016, 9:44 AM [facility] Pager [phone] After 3 pm or if no answer, call [phone]

## 2016-03-23 LAB — URINE CULTURE: ORGANISM ID, BACTERIA: NO GROWTH

## 2016-03-27 NOTE — Progress Notes (Signed)
Encounter reviewed Caramia Boutin, MD   

## 2016-04-02 ENCOUNTER — Other Ambulatory Visit: Payer: Self-pay | Admitting: Family Medicine

## 2016-04-02 MED FILL — DULoxetine HCL 60 MG CPEP: 60 | 90 days supply | Qty: 90 | Fill #1

## 2016-04-03 MED FILL — OMEPRAZOLE 20 MG CAPSULE DR: 20 | 90 days supply | Qty: 90 | Fill #0

## 2016-04-18 DIAGNOSIS — I10 Essential (primary) hypertension: Secondary | ICD-10-CM | POA: Diagnosis not present

## 2016-04-18 DIAGNOSIS — R0683 Snoring: Secondary | ICD-10-CM | POA: Diagnosis not present

## 2016-04-18 DIAGNOSIS — G4733 Obstructive sleep apnea (adult) (pediatric): Secondary | ICD-10-CM | POA: Diagnosis not present

## 2016-04-23 DIAGNOSIS — G4733 Obstructive sleep apnea (adult) (pediatric): Secondary | ICD-10-CM | POA: Diagnosis not present

## 2016-05-09 NOTE — Progress Notes (Signed)
HPI:  Here for CPE:  -Concerns and/or follow up today: PMH significant for OSA, obesity, hyperlipidemia, GAD, Low iron (blood donor)GERD and unhealthy lifestyle. Recently saw pulmonologist and dx with OSA and PAP advised.  Reports feels good on PAP - only sleeps 6 hours but reports feels rested and not tired during the day. Sees Melvia Heaps for women's health exams - completed 02/2016 and included breast exam, pelvic, pap and mammo. Due for labs, skin check and flu shot. Reports sees dermatologist for skin checks.  -Diet: doing weight watchers  -Exercise: has started some regular exercise  -Taking folic acid, vitamin D or calcium: no  -sexual activity: yes, female partner, no new partners  -wants STI testing: no  -Alcohol, Tobacco, drug use: see social history  Review of Systems - no fevers, unintentional weight loss, vision loss, hearing loss, chest pain, sob, hemoptysis, melena, hematochezia, hematuria, genital discharge, changing or concerning skin lesions, bleeding, bruising, loc, thoughts of self harm or SI  Past Medical History:  Diagnosis Date  . Anxiety   . Asthma   . Asthma   . Hyperglycemia   . Hyperlipemia   . Morbid obesity ([place])    sees weight loss clinic  . Nevus    sees dermatologist, Dr. Tonia Brooms  . Obstructive sleep apnea on CPAP     Past Surgical History:  Procedure Laterality Date  . finger laceration Right 2008   2nd finger  . implanon insertion  06-26-11   left upper arm  . MOLE REMOVAL    . OTHER SURGICAL HISTORY Left [id number]    Implanon  . WISDOM TOOTH EXTRACTION      Family History  Problem Relation Age of Onset  . Atrial fibrillation Mother   . Diabetes Father   . Cancer Father     skin  . Polycystic ovary syndrome Sister   . Depression Sister   . Anxiety disorder Sister   . Bipolar disorder Sister   . Cancer Maternal Grandfather     skin  . Dementia Maternal Grandmother     Social History   Social History  . Marital  status: Married    Spouse name: Mortimer Fries  . Number of children: 0  . Years of education: 29   Occupational History  . LEGAL ASSISTANT    . [place] History Main Topics  . Smoking status: Never Smoker  . Smokeless tobacco: Never Used  . Alcohol use No  . Drug use: No  . Sexual activity: Yes    Partners: Male    Birth control/ protection: Pill   Other Topics Concern  . None   Social History Narrative      Marital Status: Married Chief Operating Officer)    Children:  None    Pets:  5 dogs   Living Situation: Lives with his husband.   Occupation: Civil Service fast streamer     Education: Forensic psychologist ([place]) [place])    Alcohol Use:  Occasional   Tobacco Use:  None    Drug Use:  None   Diet:  Regular   Exercise:  None   Hobbies:  Reading, Movies                  Current Outpatient Prescriptions:  .  DULoxetine (CYMBALTA) 60 MG capsule, Take 1 capsule (60 mg total) by mouth daily., Disp: 90 capsule, Rfl: 1 .  ibuprofen (ADVIL,MOTRIN) 200 MG tablet, Take 3 tablets (600 mg total)  by mouth every 6 (six) hours as needed for moderate pain., Disp: 30 tablet, Rfl: 0 .  norethindrone-ethinyl estradiol (MICROGESTIN,JUNEL,LOESTRIN) 1-20 MG-MCG tablet, Take 1 tablet by mouth daily., Disp: 3 Package, Rfl: 4 .  omeprazole (PRILOSEC) 20 MG capsule, TAKE 1 CAPSULE BY MOUTH ONCE DAILY, Disp: 90 capsule, Rfl: 0  EXAM:  Vitals:   05/10/16 0734  BP: 102/80  Pulse: 79  Temp: 98 F (36.7 C)    GENERAL: vitals reviewed and listed below, alert, oriented, appears well hydrated and in no acute distress  HEENT: head atraumatic, PERRLA, normal appearance of eyes, ears, nose and mouth. moist mucus membranes.  NECK: supple, no masses or lymphadenopathy  LUNGS: clear to auscultation bilaterally, no rales, rhonchi or wheeze  CV: HRRR, no peripheral edema or cyanosis, normal pedal pulses  ABDOMEN: bowel sounds normal, soft, non tender to palpation,  no masses, no rebound or guarding  SKIN: no rash or abnormal lesions, declined full skin exam - does with gyn  MS: normal gait, moves all extremities normally  GU/BREAST: declined, does with gyn  NEURO: normal gait, speech and thought processing grossly intact, muscle tone grossly intact throughout  PSYCH: normal affect, pleasant and cooperative  ASSESSMENT AND PLAN:  Discussed the following assessment and plan:  Encounter for preventive health examination - Plan: Lipid Panel, Hemoglobin A1c  Morbid obesity (HCC)  Hyperglycemia - Plan: Hemoglobin A1c  Hyperlipidemia, unspecified hyperlipidemia type - Plan: Lipid Panel  Blood donor - Plan: CBC, Ferritin   -Discussed and advised all [place] preventive services health task force level A and B recommendations for age, sex and risks.  -Advised at least 150 minutes of exercise per week and a healthy diet with avoidance of (less then 1 serving per week) processed foods, white starches, red meat, fast foods and sweets and consisting of: * 5-9 servings of fresh fruits and vegetables (not corn or potatoes) *nuts and seeds, beans *olives and olive oil *lean meats such as fish and white chicken  *whole grains  -labs, studies and vaccines per orders this encounter  Orders Placed This Encounter  Procedures  . Lipid Panel  . Hemoglobin A1c  . CBC  . Ferritin    Patient advised to return to clinic immediately if symptoms worsen or persist or new concerns.  Patient Instructions  BEFORE YOU LEAVE: -flu shot -labs -follow up: 6 months  Vit D3 [phone] IU daily  We have ordered labs or studies at this visit. It can take up to 1-2 weeks for results and processing. IF results require follow up or explanation, we will call you with instructions. Clinically stable results will be released to your [facility]. If you have not heard from [place] or cannot find your results in [facility] in 2 weeks please contact our office at [phone].  If you are  not yet signed up for [facility], please consider signing up.   We recommend the following healthy lifestyle for LIFE: 1) Small portions.   Tip: eat off of a salad plate instead of a dinner plate.  Tip: It is ok to feel hungry after a meal - that likely means you ate an appropriate portion.  Tip: if you need more or a snack choose fruits, veggies and/or a handful of nuts or seeds.  2) Eat a healthy clean diet.  * Tip: Avoid (less then 1 serving per week): processed foods, sweets, sweetened drinks, white starches (rice, flour, bread, potatoes, pasta, etc), red meat, fast foods, butter  *Tip: CHOOSE instead   *  5-9 servings per day of fresh or frozen fruits and vegetables (but not corn, potatoes, bananas, canned or dried fruit)   *nuts and seeds, beans   *olives and olive oil   *small portions of lean meats such as fish and white chicken    *small portions of whole grains  3)Get at least 150 minutes of sweaty aerobic exercise per week.  4)Reduce stress - consider counseling, meditation and relaxation to balance other aspects of your life.          No Follow-up on file.  Colin Benton R., DO

## 2016-05-10 ENCOUNTER — Encounter: Payer: Self-pay | Admitting: Family Medicine

## 2016-05-10 ENCOUNTER — Ambulatory Visit (INDEPENDENT_AMBULATORY_CARE_PROVIDER_SITE_OTHER): Payer: 59 | Admitting: Family Medicine

## 2016-05-10 VITALS — BP 102/80 | HR 79 | Temp 98.0°F | Ht 68.75 in | Wt 374.4 lb

## 2016-05-10 DIAGNOSIS — Z0000 Encounter for general adult medical examination without abnormal findings: Secondary | ICD-10-CM | POA: Diagnosis not present

## 2016-05-10 DIAGNOSIS — Z52008 Unspecified donor, other blood: Secondary | ICD-10-CM | POA: Diagnosis not present

## 2016-05-10 DIAGNOSIS — E785 Hyperlipidemia, unspecified: Secondary | ICD-10-CM | POA: Diagnosis not present

## 2016-05-10 DIAGNOSIS — R739 Hyperglycemia, unspecified: Secondary | ICD-10-CM

## 2016-05-10 DIAGNOSIS — D649 Anemia, unspecified: Secondary | ICD-10-CM

## 2016-05-10 DIAGNOSIS — Z23 Encounter for immunization: Secondary | ICD-10-CM | POA: Diagnosis not present

## 2016-05-10 LAB — FERRITIN: FERRITIN: 11.1 ng/mL (ref 10.0–291.0)

## 2016-05-10 LAB — CBC
HEMATOCRIT: 35.9 % — AB (ref 36.0–46.0)
Hemoglobin: 11.4 g/dL — ABNORMAL LOW (ref 12.0–15.0)
MCHC: 31.9 g/dL (ref 30.0–36.0)
MCV: 75.8 fl — ABNORMAL LOW (ref 78.0–100.0)
Platelets: 400 10*3/uL (ref 150.0–400.0)
RBC: 4.73 Mil/uL (ref 3.87–5.11)
RDW: 18.3 % — AB (ref 11.5–15.5)
WBC: 10.2 10*3/uL (ref 4.0–10.5)

## 2016-05-10 LAB — LIPID PANEL
CHOL/HDL RATIO: 3
Cholesterol: 179 mg/dL (ref 0–200)
HDL: 58.8 mg/dL (ref >39.00)
LDL CALC: 99 mg/dL (ref 0–99)
NonHDL: 120.61
Triglycerides: 106 mg/dL (ref 0.0–149.0)
VLDL: 21.2 mg/dL (ref 0.0–40.0)

## 2016-05-10 LAB — HEMOGLOBIN A1C: Hgb A1c MFr Bld: 6 % (ref 4.6–6.5)

## 2016-05-10 NOTE — Patient Instructions (Signed)
BEFORE YOU LEAVE: -flu shot -labs -follow up: 6 months  Vit D3 [phone] IU daily  We have ordered labs or studies at this visit. It can take up to 1-2 weeks for results and processing. IF results require follow up or explanation, we will call you with instructions. Clinically stable results will be released to your [facility]. If you have not heard from [place] or cannot find your results in [facility] in 2 weeks please contact our office at [phone].  If you are not yet signed up for [facility], please consider signing up.   We recommend the following healthy lifestyle for LIFE: 1) Small portions.   Tip: eat off of a salad plate instead of a dinner plate.  Tip: It is ok to feel hungry after a meal - that likely means you ate an appropriate portion.  Tip: if you need more or a snack choose fruits, veggies and/or a handful of nuts or seeds.  2) Eat a healthy clean diet.  * Tip: Avoid (less then 1 serving per week): processed foods, sweets, sweetened drinks, white starches (rice, flour, bread, potatoes, pasta, etc), red meat, fast foods, butter  *Tip: CHOOSE instead   * 5-9 servings per day of fresh or frozen fruits and vegetables (but not corn, potatoes, bananas, canned or dried fruit)   *nuts and seeds, beans   *olives and olive oil   *small portions of lean meats such as fish and white chicken    *small portions of whole grains  3)Get at least 150 minutes of sweaty aerobic exercise per week.  4)Reduce stress - consider counseling, meditation and relaxation to balance other aspects of your life.

## 2016-05-10 NOTE — Progress Notes (Signed)
Pre visit review using our clinic review tool, if applicable. No additional management support is needed unless otherwise documented below in the visit note. 

## 2016-05-15 NOTE — Addendum Note (Signed)
Addended by: Agnes Lawrence on: 05/15/2016 09:35 AM   Modules accepted: Orders

## 2016-05-16 DIAGNOSIS — G4733 Obstructive sleep apnea (adult) (pediatric): Secondary | ICD-10-CM | POA: Diagnosis not present

## 2016-06-25 ENCOUNTER — Encounter: Payer: Self-pay | Admitting: Family Medicine

## 2016-06-25 MED FILL — NORETHIND-ETH ESTRAD 1-0.02: 1-20 | 84 days supply | Qty: 84 | Fill #1

## 2016-06-26 ENCOUNTER — Other Ambulatory Visit: Payer: Self-pay | Admitting: *Deleted

## 2016-06-26 DIAGNOSIS — F411 Generalized anxiety disorder: Secondary | ICD-10-CM

## 2016-06-26 MED ORDER — DULOXETINE HCL 60 MG PO CPEP: 60.0000 mg | ORAL_CAPSULE | Freq: Every day | ORAL | 1 refills | Status: DC

## 2016-06-26 MED FILL — DULoxetine HCL 60 MG CPEP: 60 | 90 days supply | Qty: 90 | Fill #0

## 2016-06-26 NOTE — Telephone Encounter (Signed)
Rx done and the pt was informed via Mychart message. 

## 2016-08-06 ENCOUNTER — Encounter: Payer: Self-pay | Admitting: Family Medicine

## 2016-08-06 ENCOUNTER — Other Ambulatory Visit: Payer: Self-pay | Admitting: *Deleted

## 2016-08-06 MED ORDER — OMEPRAZOLE 20 MG PO CPDR: 20.0000 mg | DELAYED_RELEASE_CAPSULE | Freq: Every day | ORAL | 1 refills | Status: DC

## 2016-08-06 MED FILL — OMEPRAZOLE 20 MG CAPSULE DR: 20 | 90 days supply | Qty: 90 | Fill #0

## 2016-08-06 NOTE — Telephone Encounter (Signed)
Rx done. 

## 2016-09-11 MED FILL — NORETHIND-ETH ESTRAD 1-0.02: 1-20 | 84 days supply | Qty: 84 | Fill #2

## 2016-09-25 ENCOUNTER — Encounter: Payer: Self-pay | Admitting: Family Medicine

## 2016-10-08 ENCOUNTER — Encounter: Payer: Self-pay | Admitting: Family Medicine

## 2016-10-08 ENCOUNTER — Ambulatory Visit (INDEPENDENT_AMBULATORY_CARE_PROVIDER_SITE_OTHER): Payer: 59 | Admitting: Family Medicine

## 2016-10-08 VITALS — BP 112/72 | HR 100 | Temp 97.7°F | Ht 68.75 in | Wt 365.3 lb

## 2016-10-08 DIAGNOSIS — D509 Iron deficiency anemia, unspecified: Secondary | ICD-10-CM

## 2016-10-08 DIAGNOSIS — R42 Dizziness and giddiness: Secondary | ICD-10-CM | POA: Diagnosis not present

## 2016-10-08 DIAGNOSIS — R7989 Other specified abnormal findings of blood chemistry: Secondary | ICD-10-CM | POA: Diagnosis not present

## 2016-10-08 LAB — CBC
HCT: 38.8 % (ref 36.0–46.0)
HEMOGLOBIN: 12.4 g/dL (ref 12.0–15.0)
MCHC: 31.9 g/dL (ref 30.0–36.0)
MCV: 81.4 fl (ref 78.0–100.0)
Platelets: 414 10*3/uL — ABNORMAL HIGH (ref 150.0–400.0)
RBC: 4.77 Mil/uL (ref 3.87–5.11)
RDW: 19 % — AB (ref 11.5–15.5)
WBC: 14 10*3/uL — ABNORMAL HIGH (ref 4.0–10.5)

## 2016-10-08 LAB — COMPREHENSIVE METABOLIC PANEL
ALT: 16 U/L (ref 0–35)
AST: 14 U/L (ref 0–37)
Albumin: 3.7 g/dL (ref 3.5–5.2)
Alkaline Phosphatase: 86 U/L (ref 39–117)
BUN: 18 mg/dL (ref 6–23)
CO2: 26 meq/L (ref 19–32)
Calcium: 9.2 mg/dL (ref 8.4–10.5)
Chloride: 105 mEq/L (ref 96–112)
Creatinine, Ser: 0.9 mg/dL (ref 0.40–1.20)
GFR: 77.19 mL/min (ref >60.00)
GLUCOSE: 80 mg/dL (ref 70–99)
Potassium: 3.8 mEq/L (ref 3.5–5.1)
Sodium: 141 mEq/L (ref 135–145)
TOTAL PROTEIN: 6.9 g/dL (ref 6.0–8.3)
Total Bilirubin: 0.4 mg/dL (ref 0.2–1.2)

## 2016-10-08 MED ORDER — MECLIZINE HCL 25 MG PO TABS: 25.0000 mg | ORAL_TABLET | Freq: Three times a day (TID) | ORAL | 0 refills | Status: DC | PRN

## 2016-10-08 NOTE — Patient Instructions (Signed)
BEFORE YOU LEAVE: -follow up: 1 month -labs  -can use the meclizine as needed per instructions for the vertigo  -do not drive with vertigo  -We placed a referral for you as discussed for the vestibular therapy for the vertigo. It usually takes about 1-2 weeks to process and schedule this referral. If you have not heard from [place] regarding this appointment in 2 weeks please contact our office.   Benign Positional Vertigo Vertigo is the feeling that you or your surroundings are moving when they are not. Benign positional vertigo is the most common form of vertigo. The cause of this condition is not serious (is benign). This condition is triggered by certain movements and positions (is positional). This condition can be dangerous if it occurs while you are doing something that could endanger you or others, such as driving. What are the causes? In many cases, the cause of this condition is not known. It may be caused by a disturbance in an area of the inner ear that helps your brain to sense movement and balance. This disturbance can be caused by a viral infection (labyrinthitis), head injury, or repetitive motion. What increases the risk? This condition is more likely to develop in:  Women.  People who are 6 years of age or older.  What are the signs or symptoms? Symptoms of this condition usually happen when you move your head or your eyes in different directions. Symptoms may start suddenly, and they usually last for less than a minute. Symptoms may include:  Loss of balance and falling.  Feeling like you are spinning or moving.  Feeling like your surroundings are spinning or moving.  Nausea and vomiting.  Blurred vision.  Dizziness.  Involuntary eye movement (nystagmus).  Symptoms can be mild and cause only slight annoyance, or they can be severe and interfere with daily life. Episodes of benign positional vertigo may return (recur) over time, and they may be triggered by  certain movements. Symptoms may improve over time. How is this diagnosed? This condition is usually diagnosed by medical history and a physical exam of the head, neck, and ears. You may be referred to a health care provider who specializes in ear, nose, and throat (ENT) problems (otolaryngologist) or a provider who specializes in disorders of the nervous system (neurologist). You may have additional testing, including:  MRI.  A CT scan.  Eye movement tests. Your health care provider may ask you to change positions quickly while he or she watches you for symptoms of benign positional vertigo, such as nystagmus. Eye movement may be tested with an electronystagmogram (ENG), caloric stimulation, the Dix-Hallpike test, or the roll test.  An electroencephalogram (EEG). This records electrical activity in your brain.  Hearing tests.  How is this treated? Usually, your health care provider will treat this by moving your head in specific positions to adjust your inner ear back to normal. Surgery may be needed in severe cases, but this is rare. In some cases, benign positional vertigo may resolve on its own in 2-4 weeks. Follow these instructions at home: Safety  Move slowly.Avoid sudden body or head movements.  Avoid driving.  Avoid operating heavy machinery.  Avoid doing any tasks that would be dangerous to you or others if a vertigo episode would occur.  If you have trouble walking or keeping your balance, try using a cane for stability. If you feel dizzy or unstable, sit down right away.  Return to your normal activities as told by your health  care provider. Ask your health care provider what activities are safe for you. General instructions  Take over-the-counter and prescription medicines only as told by your health care provider.  Avoid certain positions or movements as told by your health care provider.  Drink enough fluid to keep your urine clear or pale yellow.  Keep all  follow-up visits as told by your health care provider. This is important. Contact a health care provider if:  You have a fever.  Your condition gets worse or you develop new symptoms.  Your family or friends notice any behavioral changes.  Your nausea or vomiting gets worse.  You have numbness or a "pins and needles" sensation. Get help right away if:  You have difficulty speaking or moving.  You are always dizzy.  You faint.  You develop severe headaches.  You have weakness in your legs or arms.  You have changes in your hearing or vision.  You develop a stiff neck.  You develop sensitivity to light. This information is not intended to replace advice given to you by your health care provider. Make sure you discuss any questions you have with your health care provider. Document Released: 01/29/2006 Document Revised: 09/29/2015 Document Reviewed: 08/16/2014 Elsevier Interactive Patient Education  [employer].

## 2016-10-08 NOTE — Progress Notes (Signed)
HPI:  Acute visit for:  Vertigo: -started 6 days ago -symptoms: brief spells vertigo triggered by quick movements of the head -no HA, fevers, neck pain, changes in hearing or speech or vision, weakness, numbness, vomiting  Plasma donor issue. Reports 10 years ago and recently told had low protein when donating plasm. Reports was told not a concern, likely mildly dehydrated. She wants basic labs. Hx iron def anemia.   ROS: See pertinent positives and negatives per HPI.  Past Medical History:  Diagnosis Date  . Anxiety   . Asthma   . Asthma   . Hyperglycemia   . Hyperlipemia   . Morbid obesity ([place])    sees weight loss clinic  . Nevus    sees dermatologist, Dr. Tonia Brooms  . Obstructive sleep apnea on CPAP     Past Surgical History:  Procedure Laterality Date  . finger laceration Right 2008   2nd finger  . implanon insertion  06-26-11   left upper arm  . MOLE REMOVAL    . OTHER SURGICAL HISTORY Left [id number]    Implanon  . WISDOM TOOTH EXTRACTION      Family History  Problem Relation Age of Onset  . Atrial fibrillation Mother   . Diabetes Father   . Cancer Father        skin  . Polycystic ovary syndrome Sister   . Depression Sister   . Anxiety disorder Sister   . Bipolar disorder Sister   . Cancer Maternal Grandfather        skin  . Dementia Maternal Grandmother     Social History   Social History  . Marital status: Married    Spouse name: Mortimer Fries  . Number of children: 0  . Years of education: 76   Occupational History  . LEGAL ASSISTANT    . [place] History Main Topics  . Smoking status: Never Smoker  . Smokeless tobacco: Never Used  . Alcohol use No  . Drug use: No  . Sexual activity: Yes    Partners: Male    Birth control/ protection: Pill   Other Topics Concern  . None   Social History Narrative      Marital Status: Married Chief Operating Officer)    Children:  None    Pets:  5 dogs   Living Situation: Lives with  his husband.   Occupation: Civil Service fast streamer     Education: Forensic psychologist ([place]) [place] [place])    Alcohol Use:  Occasional   Tobacco Use:  None    Drug Use:  None   Diet:  Regular   Exercise:  None   Hobbies:  Reading, Movies                  Current Outpatient Prescriptions:  .  DULoxetine (CYMBALTA) 60 MG capsule, Take 1 capsule (60 mg total) by mouth daily., Disp: 90 capsule, Rfl: 1 .  ibuprofen (ADVIL,MOTRIN) 200 MG tablet, Take 3 tablets (600 mg total) by mouth every 6 (six) hours as needed for moderate pain., Disp: 30 tablet, Rfl: 0 .  norethindrone-ethinyl estradiol (MICROGESTIN,JUNEL,LOESTRIN) 1-20 MG-MCG tablet, Take 1 tablet by mouth daily., Disp: 3 Package, Rfl: 4 .  omeprazole (PRILOSEC) 20 MG capsule, Take 1 capsule (20 mg total) by mouth daily., Disp: 90 capsule, Rfl: 1 .  meclizine (ANTIVERT) 25 MG tablet, Take 1 tablet (25 mg total) by mouth 3 (three) times daily as needed for dizziness., Disp: 30  tablet, Rfl: 0  EXAM:  Vitals:   10/08/16 0944  BP: 112/72  Pulse: 100  Temp: 97.7 F (36.5 C)    Body mass index is 54.34 kg/m.  GENERAL: vitals reviewed and listed above, alert, oriented, appears well hydrated and in no acute distress  HEENT: atraumatic, conjunttiva clear, no obvious abnormalities on inspection of external nose and ears  NECK: no obvious masses on inspection  LUNGS: clear to auscultation bilaterally, no wheezes, rales or rhonchi, good air movement  CV: HRRR, no peripheral edema  MS: moves all extremities without noticeable abnormality  PSYCH: pleasant and cooperative, no obvious depression or anxiety  ASSESSMENT AND PLAN:  Discussed the following assessment and plan:  Vertigo - Plan: Ambulatory referral to Physical Therapy -we discussed possible serious and likely etiologies, workup and treatment, treatment risks and return precautions - most likely BPPV  -after this discussion, Taneshia opted for vestibular  rehab, meclizine, advised not to drive with vertigo -follow up advised 1 month -of course, we advised Alexxis  to return or notify a doctor immediately if symptoms worsen or persist or new concerns arise.   Iron deficiency anemia, unspecified iron deficiency anemia type - Plan: Comprehensive metabolic panel, CBC -basic labs for this and ? Low alb -advised she follow up with donation center to request specifically what was low and let us know  -Patient advised to return or notify a doctor immediately if symptoms worsen or persist or new concerns arise.  Patient Instructions  BEFORE YOU LEAVE: -follow up: 1 month -labs  -can use the meclizine as needed per instructions for the vertigo  -do not drive with vertigo  -We placed a referral for you as discussed for the vestibular therapy for the vertigo. It usually takes about 1-2 weeks to process and schedule this referral. If you have not heard from [place] regarding this appointment in 2 weeks please contact our office.   Benign Positional Vertigo Vertigo is the feeling that you or your surroundings are moving when they are not. Benign positional vertigo is the most common form of vertigo. The cause of this condition is not serious (is benign). This condition is triggered by certain movements and positions (is positional). This condition can be dangerous if it occurs while you are doing something that could endanger you or others, such as driving. What are the causes? In many cases, the cause of this condition is not known. It may be caused by a disturbance in an area of the inner ear that helps your brain to sense movement and balance. This disturbance can be caused by a viral infection (labyrinthitis), head injury, or repetitive motion. What increases the risk? This condition is more likely to develop in:  Women.  People who are 84 years of age or older.  What are the signs or symptoms? Symptoms of this condition usually happen when you  move your head or your eyes in different directions. Symptoms may start suddenly, and they usually last for less than a minute. Symptoms may include:  Loss of balance and falling.  Feeling like you are spinning or moving.  Feeling like your surroundings are spinning or moving.  Nausea and vomiting.  Blurred vision.  Dizziness.  Involuntary eye movement (nystagmus).  Symptoms can be mild and cause only slight annoyance, or they can be severe and interfere with daily life. Episodes of benign positional vertigo may return (recur) over time, and they may be triggered by certain movements. Symptoms may improve over time. How is  this diagnosed? This condition is usually diagnosed by medical history and a physical exam of the head, neck, and ears. You may be referred to a health care provider who specializes in ear, nose, and throat (ENT) problems (otolaryngologist) or a provider who specializes in disorders of the nervous system (neurologist). You may have additional testing, including:  MRI.  A CT scan.  Eye movement tests. Your health care provider may ask you to change positions quickly while he or she watches you for symptoms of benign positional vertigo, such as nystagmus. Eye movement may be tested with an electronystagmogram (ENG), caloric stimulation, the Dix-Hallpike test, or the roll test.  An electroencephalogram (EEG). This records electrical activity in your brain.  Hearing tests.  How is this treated? Usually, your health care provider will treat this by moving your head in specific positions to adjust your inner ear back to normal. Surgery may be needed in severe cases, but this is rare. In some cases, benign positional vertigo may resolve on its own in 2-4 weeks. Follow these instructions at home: Safety  Move slowly.Avoid sudden body or head movements.  Avoid driving.  Avoid operating heavy machinery.  Avoid doing any tasks that would be dangerous to you or others  if a vertigo episode would occur.  If you have trouble walking or keeping your balance, try using a cane for stability. If you feel dizzy or unstable, sit down right away.  Return to your normal activities as told by your health care provider. Ask your health care provider what activities are safe for you. General instructions  Take over-the-counter and prescription medicines only as told by your health care provider.  Avoid certain positions or movements as told by your health care provider.  Drink enough fluid to keep your urine clear or pale yellow.  Keep all follow-up visits as told by your health care provider. This is important. Contact a health care provider if:  You have a fever.  Your condition gets worse or you develop new symptoms.  Your family or friends notice any behavioral changes.  Your nausea or vomiting gets worse.  You have numbness or a "pins and needles" sensation. Get help right away if:  You have difficulty speaking or moving.  You are always dizzy.  You faint.  You develop severe headaches.  You have weakness in your legs or arms.  You have changes in your hearing or vision.  You develop a stiff neck.  You develop sensitivity to light. This information is not intended to replace advice given to you by your health care provider. Make sure you discuss any questions you have with your health care provider. Document Released: 01/29/2006 Document Revised: 09/29/2015 Document Reviewed: 08/16/2014 Elsevier Interactive Patient Education  2018 [place]., DO

## 2016-10-09 NOTE — Addendum Note (Signed)
Addended by: Agnes Lawrence on: 10/09/2016 11:43 AM   Modules accepted: Orders

## 2016-10-15 DIAGNOSIS — G4733 Obstructive sleep apnea (adult) (pediatric): Secondary | ICD-10-CM | POA: Diagnosis not present

## 2016-10-15 DIAGNOSIS — R0683 Snoring: Secondary | ICD-10-CM | POA: Diagnosis not present

## 2016-10-31 MED FILL — OMEPRAZOLE 20 MG CAPSULE DR: 20 | 90 days supply | Qty: 90 | Fill #1

## 2016-10-31 MED FILL — DULoxetine HCL 60 MG CPEP: 60 | 90 days supply | Qty: 90 | Fill #1

## 2016-11-08 ENCOUNTER — Ambulatory Visit: Payer: Self-pay | Admitting: Family Medicine

## 2016-11-19 ENCOUNTER — Ambulatory Visit: Payer: Self-pay | Admitting: Family Medicine

## 2016-11-19 DIAGNOSIS — Z0289 Encounter for other administrative examinations: Secondary | ICD-10-CM

## 2016-12-07 MED FILL — NORETHIND-ETH ESTRAD 1-0.02: 1-20 | 84 days supply | Qty: 84 | Fill #3

## 2017-01-24 ENCOUNTER — Encounter: Payer: Self-pay | Admitting: Family Medicine

## 2017-01-29 ENCOUNTER — Encounter: Payer: Self-pay | Admitting: Family Medicine

## 2017-01-30 ENCOUNTER — Other Ambulatory Visit: Payer: Self-pay | Admitting: Family Medicine

## 2017-01-30 DIAGNOSIS — F411 Generalized anxiety disorder: Secondary | ICD-10-CM

## 2017-01-31 ENCOUNTER — Other Ambulatory Visit: Payer: Self-pay | Admitting: *Deleted

## 2017-01-31 MED FILL — OMEPRAZOLE 20 MG CAP: 20 | 90 days supply | Qty: 90 | Fill #0

## 2017-01-31 MED FILL — DULoxetine HCL 60 MG CPEP: 60 | 90 days supply | Qty: 90 | Fill #0

## 2017-02-13 ENCOUNTER — Encounter: Payer: Self-pay | Admitting: Family Medicine

## 2017-02-13 NOTE — Progress Notes (Signed)
HPI:  Follow up.  Hx vertigo, iron def anemia (plasma donor), anxiety, OSA, GERD, hyperglycemia and obesity. Reports she is doing well and is without complaints. Reports mood has been good and she wishes to continue the Cymbalta. Has not had any acid reflux symptoms - taking the PPI daily. Has gained some weight unfortunately since the last visit. Due for flu shot and CPE in January.  ROS: See pertinent positives and negatives per HPI.   Past Medical History:  Diagnosis Date  . Anxiety   . Asthma   . Hyperglycemia   . Hyperlipemia   . Morbid obesity ([place])    sees weight loss clinic  . Nevus    sees dermatologist, Dr. Tonia Brooms  . Obstructive sleep apnea on CPAP     Past Surgical History:  Procedure Laterality Date  . finger laceration Right 2008   2nd finger  . implanon insertion  06-26-11   left upper arm  . MOLE REMOVAL    . OTHER SURGICAL HISTORY Left [id number]    Implanon  . WISDOM TOOTH EXTRACTION      Family History  Problem Relation Age of Onset  . Atrial fibrillation Mother   . Diabetes Father   . Cancer Father        skin  . Polycystic ovary syndrome Sister   . Depression Sister   . Anxiety disorder Sister   . Bipolar disorder Sister   . Cancer Maternal Grandfather        skin  . Dementia Maternal Grandmother     Social History   Social History  . Marital status: Married    Spouse name: Mortimer Fries  . Number of children: 0  . Years of education: 73   Occupational History  . LEGAL ASSISTANT    . [place] History Main Topics  . Smoking status: Never Smoker  . Smokeless tobacco: Never Used  . Alcohol use No  . Drug use: No  . Sexual activity: Yes    Partners: Male    Birth control/ protection: Pill   Other Topics Concern  . None   Social History Narrative      Marital Status: Married Chief Operating Officer)    Children:  None    Pets:  5 dogs   Living Situation: Lives with his husband.   Occupation: Civil Service fast streamer      Education: Forensic psychologist ([place]) [employer] [place] [place] [employer])    Alcohol Use:  Occasional   Tobacco Use:  None    Drug Use:  None   Diet:  Regular   Exercise:  None   Hobbies:  Reading, Movies                  Current Outpatient Prescriptions:  .  DULoxetine (CYMBALTA) 60 MG capsule, TAKE 1 CAPSULE (60 MG TOTAL) BY MOUTH DAILY., Disp: 90 capsule, Rfl: 0 .  ibuprofen (ADVIL,MOTRIN) 200 MG tablet, Take 3 tablets (600 mg total) by mouth every 6 (six) hours as needed for moderate pain., Disp: 30 tablet, Rfl: 0 .  norethindrone-ethinyl estradiol (MICROGESTIN,JUNEL,LOESTRIN) 1-20 MG-MCG tablet, Take 1 tablet by mouth daily., Disp: 3 Package, Rfl: 4 .  omeprazole (PRILOSEC) 20 MG capsule, TAKE 1 CAPSULE BY MOUTH DAILY., Disp: 90 capsule, Rfl: 0  EXAM:  Vitals:   02/14/17 0839  BP: 110/72  Pulse: 93  Temp: 98.3 F (36.8 C)    Body mass index is 54.61 kg/m.  GENERAL: vitals reviewed and listed  above, alert, oriented, appears well hydrated and in no acute distress  HEENT: atraumatic, conjunttiva clear, no obvious abnormalities on inspection of external nose and ears  NECK: no obvious masses on inspection  LUNGS: clear to auscultation bilaterally, no wheezes, rales or rhonchi, good air movement  CV: HRRR, no peripheral edema  MS: moves all extremities without noticeable abnormality  PSYCH: pleasant and cooperative, no obvious depression or anxiety  ASSESSMENT AND PLAN:  Discussed the following assessment and plan:  Generalized anxiety disorder  Morbid obesity (HCC)  Gastroesophageal reflux disease without esophagitis  Hyperglycemia  OSA (obstructive sleep apnea)  -flu shot today with detailed risks/benefit info supplied -lifestyle recs -continue current medications except for trial off PPI and restart if not tolerated -CPE with labs next visit -Patient advised to return or notify a doctor immediately if symptoms worsen or persist or new concerns  arise.  Patient Instructions  BEFORE YOU LEAVE: -flu shot -follow up: CPE and labs in January  Try trial off the Prilosec, restart if needed.  Advise regular aerobic exercise (at least 150 minutes per week of sweaty exercise) and a healthy diet. Try to eat at least 5-9 servings of vegetables and fruits per day (not corn, potatoes or bananas.) Avoid sweets, red meat, pork, butter, fried foods, fast food, processed food, excessive dairy, eggs and coconut. Replace bad fats with good fats - fish, nuts and seeds, canola oil, olive oil.   WE NOW OFFER   [facility] FAST TRACK!!!  SAME DAY Appointments for ACUTE CARE  Such as: Sprains, Injuries, cuts, abrasions, rashes, muscle pain, joint pain, back pain Colds, flu, sore throats, headache, allergies, cough, fever  Ear pain, sinus and eye infections Abdominal pain, nausea, vomiting, diarrhea, upset stomach Animal/insect bites  3 Easy Ways to Schedule: Walk-In Scheduling Call in scheduling Mychart Sign-up: [email or web address]           Colin Benton R., DO

## 2017-02-14 ENCOUNTER — Ambulatory Visit (INDEPENDENT_AMBULATORY_CARE_PROVIDER_SITE_OTHER): Payer: 59 | Admitting: Family Medicine

## 2017-02-14 ENCOUNTER — Encounter: Payer: Self-pay | Admitting: Family Medicine

## 2017-02-14 VITALS — BP 110/72 | HR 93 | Temp 98.3°F | Ht 68.75 in | Wt 367.1 lb

## 2017-02-14 DIAGNOSIS — K219 Gastro-esophageal reflux disease without esophagitis: Secondary | ICD-10-CM | POA: Diagnosis not present

## 2017-02-14 DIAGNOSIS — R739 Hyperglycemia, unspecified: Secondary | ICD-10-CM

## 2017-02-14 DIAGNOSIS — Z23 Encounter for immunization: Secondary | ICD-10-CM | POA: Diagnosis not present

## 2017-02-14 DIAGNOSIS — F411 Generalized anxiety disorder: Secondary | ICD-10-CM

## 2017-02-14 DIAGNOSIS — G4733 Obstructive sleep apnea (adult) (pediatric): Secondary | ICD-10-CM | POA: Diagnosis not present

## 2017-02-14 NOTE — Patient Instructions (Signed)
BEFORE YOU LEAVE: -flu shot -follow up: CPE and labs in January  Try trial off the Prilosec, restart if needed.  Advise regular aerobic exercise (at least 150 minutes per week of sweaty exercise) and a healthy diet. Try to eat at least 5-9 servings of vegetables and fruits per day (not corn, potatoes or bananas.) Avoid sweets, red meat, pork, butter, fried foods, fast food, processed food, excessive dairy, eggs and coconut. Replace bad fats with good fats - fish, nuts and seeds, canola oil, olive oil.   WE NOW OFFER   [facility] FAST TRACK!!!  SAME DAY Appointments for ACUTE CARE  Such as: Sprains, Injuries, cuts, abrasions, rashes, muscle pain, joint pain, back pain Colds, flu, sore throats, headache, allergies, cough, fever  Ear pain, sinus and eye infections Abdominal pain, nausea, vomiting, diarrhea, upset stomach Animal/insect bites  3 Easy Ways to Schedule: Walk-In Scheduling Call in scheduling Mychart Sign-up: [email or web address]

## 2017-02-14 NOTE — Addendum Note (Signed)
Addended by: Agnes Lawrence on: 02/14/2017 08:58 AM   Modules accepted: Orders

## 2017-02-28 ENCOUNTER — Ambulatory Visit (INDEPENDENT_AMBULATORY_CARE_PROVIDER_SITE_OTHER): Payer: 59 | Admitting: Certified Nurse Midwife

## 2017-02-28 ENCOUNTER — Encounter: Payer: Self-pay | Admitting: Certified Nurse Midwife

## 2017-02-28 VITALS — BP 110/70 | HR 72 | Resp 16 | Ht 68.0 in | Wt 365.0 lb

## 2017-02-28 DIAGNOSIS — Z3041 Encounter for surveillance of contraceptive pills: Secondary | ICD-10-CM | POA: Diagnosis not present

## 2017-02-28 DIAGNOSIS — Z308 Encounter for other contraceptive management: Secondary | ICD-10-CM | POA: Diagnosis not present

## 2017-02-28 DIAGNOSIS — Z01419 Encounter for gynecological examination (general) (routine) without abnormal findings: Secondary | ICD-10-CM

## 2017-02-28 MED ORDER — NORETHINDRONE ACET-ETHINYL EST 1-20 MG-MCG PO TABS: 1.0000 | ORAL_TABLET | Freq: Every day | ORAL | 4 refills | Status: DC

## 2017-02-28 NOTE — Patient Instructions (Signed)

## 2017-02-28 NOTE — Progress Notes (Signed)
31 y.o. G0P0000 Married  Caucasian Fe here for annual exam. Periods normal to scant. Denies warning signs with, but has noted decreased libido. Has been working on weight loss and doing with weight watchers and lost 14 pounds. Considering pregnancy at some point. Patient concerned OCP affecting libido. She also is on Cymbalta for depression, which she is aware can cause decrease libido. Will decide if she wants to switch contraception or use condoms as trial to see if improves. Desires refill on Rx. Exercising 3 x weekly now and enjoys. Sees PCP at [facility] for aex and labs and for medication management of GERD and Anxiety. No other health issues today. Went to [employer] in [place] for vacation!  Patient's last menstrual period was 02/13/2017 (exact date).          Sexually active: Yes.    The current method of family planning is OCP (estrogen/progesterone).    Exercising: Yes.    walking Smoker:  no  Health Maintenance: Pap:  02-18-14 neg, 02-28-16 neg HPV HR neg History of Abnormal Pap: no MMG:  03-06-16 bilateral & left breast u/s category b density birads 1:neg Self Breast exams: yes Colonoscopy:  none BMD:   none TDaP:  2017 Shingles: no Pneumonia: no Hep C and HIV: not done Labs: none   reports that she has never smoked. She has never used smokeless tobacco. She reports that she drinks alcohol. She reports that she does not use drugs.  Past Medical History:  Diagnosis Date  . Anxiety   . Asthma   . Hyperglycemia   . Hyperlipemia   . Morbid obesity ([place])    sees weight loss clinic  . Nevus    sees dermatologist, Dr. Tonia Brooms  . Obstructive sleep apnea on CPAP     Past Surgical History:  Procedure Laterality Date  . finger laceration Right 2008   2nd finger  . implanon insertion  06-26-11   left upper arm  . MOLE REMOVAL    . OTHER SURGICAL HISTORY Left [id number]    Implanon  . WISDOM TOOTH EXTRACTION      Current Outpatient Prescriptions  Medication Sig  Dispense Refill  . DULoxetine (CYMBALTA) 60 MG capsule TAKE 1 CAPSULE (60 MG TOTAL) BY MOUTH DAILY. 90 capsule 0  . ibuprofen (ADVIL,MOTRIN) 200 MG tablet Take 3 tablets (600 mg total) by mouth every 6 (six) hours as needed for moderate pain. 30 tablet 0  . norethindrone-ethinyl estradiol (MICROGESTIN,JUNEL,LOESTRIN) 1-20 MG-MCG tablet Take 1 tablet by mouth daily. 3 Package 4  . omeprazole (PRILOSEC) 20 MG capsule TAKE 1 CAPSULE BY MOUTH DAILY. 90 capsule 0   No current facility-administered medications for this visit.     Family History  Problem Relation Age of Onset  . Atrial fibrillation Mother   . Diabetes Father   . Cancer Father        skin  . Polycystic ovary syndrome Sister   . Depression Sister   . Anxiety disorder Sister   . Bipolar disorder Sister   . Cancer Maternal Grandfather        skin  . Dementia Maternal Grandmother     ROS:  Pertinent items are noted in HPI.  Otherwise, a comprehensive ROS was negative.  Exam:   BP 110/70   Pulse 72   Resp 16   Ht 5\' 8"  (1.727 m)   Wt (!) 365 lb (165.6 kg)   LMP 02/13/2017 (Exact Date)   BMI 55.50 kg/m  Height: 5\' 8"  (172.7 cm)  Ht Readings from Last 3 Encounters:  02/28/17 5\' 8"  (1.727 m)  02/14/17 5' 8.75" (1.746 m)  10/08/16 5' 8.75" (1.746 m)    General appearance: alert, cooperative and appears stated age Head: Normocephalic, without obvious abnormality, atraumatic Neck: no adenopathy, supple, symmetrical, trachea midline and thyroid normal to inspection and palpation Lungs: clear to auscultation bilaterally Breasts: normal appearance, no masses or tenderness, No nipple retraction or dimpling, No nipple discharge or bleeding, No axillary or supraclavicular adenopathy Heart: regular rate and rhythm Abdomen: soft, non-tender; no masses,  no organomegaly Extremities: extremities normal, atraumatic, no cyanosis or edema Skin: Skin color, texture, turgor normal. No rashes or lesions Lymph nodes: Cervical,  supraclavicular, and axillary nodes normal. No abnormal inguinal nodes palpated Neurologic: Grossly normal   Pelvic: External genitalia:  no lesions              Urethra:  normal appearing urethra with no masses, tenderness or lesions              Bartholin's and Skene's: normal                 Vagina: normal appearing vagina with normal color and discharge, no lesions              Cervix: no cervical motion tenderness, no lesions and nulliparous appearance              Pap taken: No. Bimanual Exam:  Uterus:  normal size, contour, position, consistency, mobility, non-tender and anteverted              Adnexa: normal adnexa and no mass, fullness, tenderness               Rectovaginal: Confirms               Anus:  normal sphincter tone, no lesions  Chaperone present: yes  A:  Well Woman with normal exam  Contraception OCP desired working well  Morbid Obesity on weight loss program  Decrease libido  Anxiety and GERD with PCP management  P:   Reviewed health and wellness pertinent to exam  Warning signs reviewed with OCP and risks/benefits, desires continuance, do not feel libido change is due to [facility]  Rx Microgestin see order with instructions  Discussed weight can be a factor in body image and libido concerns and Cymbalta use. Encouraged to continue her weight loss journey for future health. Discussed other options and food choices. Participating in [employer] weight loss program.  Continue follow up with PCP as indicated.  Pap smear: no   counseled on breast self exam, use and side effects of OCP's, adequate intake of calcium and vitamin D, diet and exercise  return annually or prn  An After Visit Summary was printed and given to the patient.

## 2017-03-22 ENCOUNTER — Ambulatory Visit (INDEPENDENT_AMBULATORY_CARE_PROVIDER_SITE_OTHER): Payer: 59 | Admitting: Internal Medicine

## 2017-03-22 ENCOUNTER — Encounter: Payer: Self-pay | Admitting: Internal Medicine

## 2017-03-22 VITALS — BP 122/70 | HR 92 | Ht 68.0 in | Wt 364.4 lb

## 2017-03-22 DIAGNOSIS — G4733 Obstructive sleep apnea (adult) (pediatric): Secondary | ICD-10-CM

## 2017-03-22 NOTE — Patient Instructions (Signed)
Order- DME [facility]    We can continue CPAP auto 5-15, mask of choice, humidifier, supplies, [place]                                           Please send new supplies       Dx OSA   Please call if we can help

## 2017-03-22 NOTE — Progress Notes (Signed)
03/22/17-31 year old female never smoker followed for OSA, complicated by morbid obesity, asthma, anxiety, GERD HST-12/28/15-AHI 13.1/hour, desaturation to 82%, body weight 358 pounds CPAP auto 5-15/ [facility] ---OSA; DME [facility]; pt wears CPAP nightly and DL attached. Will need new order for CPAP supplies. Former AD patient.  Download confirms good compliance-90%, AHI 0.9/hour.  CPAP is comfortable.  It stops snoring.  Daytime sleepiness is a function of whether she goes to bed on time but otherwise she feels well rested with no concerns. Asthma has become a minimal issue as an adult with no sleep disturbance and no need for rescue inhaler.  ROS-see HPI   + = positive Constitutional:    weight loss, night sweats, fevers, chills, fatigue, lassitude. HEENT:    headaches, difficulty swallowing, tooth/dental problems, sore throat,       sneezing, itching, ear ache, nasal congestion, post nasal drip, snoring CV:    chest pain, orthopnea, PND, swelling in lower extremities, anasarca,                                  dizziness, palpitations Resp:   shortness of breath with exertion or at rest.                productive cough,   non-productive cough, coughing up of blood.              change in color of mucus.  wheezing.   Skin:    rash or lesions. GI:  No-   heartburn, indigestion, abdominal pain, nausea, vomiting, diarrhea,                 change in bowel habits, loss of appetite GU: dysuria, change in color of urine, no urgency or frequency.   flank pain. MS:   joint pain, stiffness, decreased range of motion, back pain. Neuro-     nothing unusual Psych:  change in mood or affect.  depression or anxiety.   memory loss.  OBJ- Physical Exam General- Alert, Oriented, Affect-appropriate, Distress- none acute, morbidly obese Skin- rash-none, lesions- none, excoriation- none Lymphadenopathy- none Head- atraumatic            Eyes- Gross vision intact, PERRLA, conjunctivae and secretions clear  Ears- Hearing, canals-normal            Nose- Clear, no-Septal dev, mucus, polyps, erosion, perforation             Throat- Mallampati III , mucosa clear , drainage- none, tonsils- atrophic Neck- flexible , trachea midline, no stridor , thyroid nl, carotid no bruit Chest - symmetrical excursion , unlabored           Heart/CV- RRR , no murmur , no gallop  , no rub, nl s1 s2                           - JVD- none , edema- none, stasis changes- none, varices- none           Lung- clear to P&A, wheeze- none, cough- none , dullness-none, rub- none           Chest wall-  Abd-  Br/ Gen/ Rectal- Not done, not indicated Extrem- cyanosis- none, clubbing, none, atrophy- none, strength- nl Neuro- grossly intact to observation

## 2017-03-22 NOTE — Assessment & Plan Note (Signed)
Comfortable in satisfactory control and CPAP use.  She can tell it stops her snoring. Plan-we will continue auto 5-15 and request the new supplies she needs.

## 2017-03-22 NOTE — Assessment & Plan Note (Signed)
She is way too big for her own good and may want to consider bariatric referral.

## 2017-04-08 MED FILL — NORETHIND-ETH ESTRAD 1-0.02: 1-20 | 84 days supply | Qty: 63 | Fill #0

## 2017-04-17 DIAGNOSIS — G4733 Obstructive sleep apnea (adult) (pediatric): Secondary | ICD-10-CM | POA: Diagnosis not present

## 2017-05-09 ENCOUNTER — Other Ambulatory Visit: Payer: Self-pay | Admitting: Family Medicine

## 2017-05-09 DIAGNOSIS — F411 Generalized anxiety disorder: Secondary | ICD-10-CM

## 2017-05-16 ENCOUNTER — Encounter: Payer: Self-pay | Admitting: Family Medicine

## 2017-05-20 MED FILL — OMEPRAZOLE 20 MG CAP: 20 | 90 days supply | Qty: 90 | Fill #0

## 2017-05-20 MED FILL — DULoxetine HCL 60 MG CPEP: 60 | 90 days supply | Qty: 90 | Fill #0

## 2017-05-30 NOTE — Progress Notes (Signed)
HPI:  Here for CPE:  -Concerns and/or follow up today:  Mary Brandt is a pleasant 32 y.o. here for  Her physical. Chronic medical problems summarized below were reviewed for changes and stability and were updated as needed below. These issues and their treatment remain stable for the most part.  She has several new concerns.  First of all she has a rash on the left arm that she has had for about 3 weeks.  It is mildly itchy.  Has a new puppy.  Tried hydrocortisone for treatment, did not fix it.  She has an ulcer on her lower lip on and off for some time. Denies CP, SOB, DOE, treatment intolerance or new symptoms. Sees gyn for gynecology exams. Due for labs.  Anxiety: -medications: cymbalta -Reports mood is good  Hyperglycemia/obesity: -lifestyle recs advised -Doing weight watchers, trying to walk her dog on a regular basis  GERD: -meds: PPI -Bad heartburn if she stops medicine  OSA: -seeing pulmonology  -Diet: variety of foods, balance and well rounded, larger portion sizes -Exercise: no regular exercise -Taking folic acid, vitamin D or calcium: no -Diabetes and Dyslipidemia Screening: fasting for labs -Vaccines: see vaccine section EPIC -pap history: sees gyn, utd -FDLMP: see nursing notes -sexual activity: yes, female partner, no new partners -wants STI testing (Hep C if born 34-65): no -FH breast, colon or ovarian ca: see FH Last mammogram: n/a Last colon cancer screening: n/a Breast Ca Risk Assessment: see family history and pt history DEXA (>/= 75): n/a  -Alcohol, Tobacco, drug use: see social history  Review of Systems - no fevers, unintentional weight loss, vision loss, hearing loss, chest pain, sob, hemoptysis, melena, hematochezia, hematuria, genital discharge, changing or concerning skin lesions, bleeding, bruising, loc, thoughts of self harm or SI  Past Medical History:  Diagnosis Date  . Anxiety   . Asthma   . Hyperglycemia   . Hyperlipemia   .  Morbid obesity ([place])    sees weight loss clinic  . Nevus    sees dermatologist, Dr. Tonia Brooms  . Obstructive sleep apnea on CPAP     Past Surgical History:  Procedure Laterality Date  . finger laceration Right 2008   2nd finger  . implanon insertion  06-26-11   left upper arm  . MOLE REMOVAL    . OTHER SURGICAL HISTORY Left [id number]    Implanon  . WISDOM TOOTH EXTRACTION      Family History  Problem Relation Age of Onset  . Atrial fibrillation Mother   . Diabetes Father   . Cancer Father        skin  . Polycystic ovary syndrome Sister   . Depression Sister   . Anxiety disorder Sister   . Bipolar disorder Sister   . Cancer Maternal Grandfather        skin  . Dementia Maternal Grandmother     Social History   Socioeconomic History  . Marital status: Married    Spouse name: Mortimer Fries  . Number of children: 0  . Years of education: 71  . Highest education level: None  Social Needs  . Financial resource strain: None  . Food insecurity - worry: None  . Food insecurity - inability: None  . Transportation needs - medical: None  . Transportation needs - non-medical: None  Occupational History  . Occupation: LEGAL ASSISTANT   . Occupation: Conservator, museum/gallery: [employer]  Tobacco Use  . Smoking status: Never Smoker  . Smokeless  tobacco: Never Used  Substance and Sexual Activity  . Alcohol use: Yes    Alcohol/week: 0.0 - 0.6 oz  . Drug use: No  . Sexual activity: Yes    Partners: Male    Birth control/protection: Pill  Other Topics Concern  . None  Social History Narrative      Marital Status: Married Chief Operating Officer)    Children:  None    Pets:  5 dogs   Living Situation: Lives with his husband.   Occupation: Civil Service fast streamer     Education: Forensic psychologist ([place]) [place])    Alcohol Use:  Occasional   Tobacco Use:  None    Drug Use:  None   Diet:  Regular   Exercise:  None   Hobbies:  Reading, Movies                Current Outpatient Medications:  .  DULoxetine (CYMBALTA) 60 MG capsule, TAKE 1 CAPSULE BY MOUTH DAILY. PATIENT NEEDS AN APPOINTMENT, Disp: 90 capsule, Rfl: 0 .  ibuprofen (ADVIL,MOTRIN) 200 MG tablet, Take 3 tablets (600 mg total) by mouth every 6 (six) hours as needed for moderate pain., Disp: 30 tablet, Rfl: 0 .  norethindrone-ethinyl estradiol (MICROGESTIN,JUNEL,LOESTRIN) 1-20 MG-MCG tablet, Take 1 tablet by mouth daily., Disp: 3 Package, Rfl: 4 .  omeprazole (PRILOSEC) 20 MG capsule, TAKE 1 CAPSULE BY MOUTH DAILY. PATIENT NEEDS APPOINTMENT, Disp: 90 capsule, Rfl: 0  EXAM:  Vitals:   06/03/17 0814  BP: 100/80  Pulse: 83  Temp: 97.9 F (36.6 C)    GENERAL: vitals reviewed and listed below, alert, oriented, appears well hydrated and in no acute distress  HEENT: head atraumatic, PERRLA, normal appearance of eyes, ears, nose and mouth. moist mucus membranes. Small ulcer R lower inner lip.  NECK: supple, no masses or lymphadenopathy  LUNGS: clear to auscultation bilaterally, no rales, rhonchi or wheeze  CV: HRRR, no peripheral edema or cyanosis, normal pedal pulses  ABDOMEN: bowel sounds normal, soft, non tender to palpation, no masses, no rebound or guarding  GU/BREAST: declined, does with gyn  SKIN: no rash or abnormal lesions except for 3 irregularly-shaped circular lesions on the sorry L was arm with fine scale and central clearing the medicine  MS: normal gait, moves all extremities normally  NEURO: normal gait, speech and thought processing grossly intact, muscle tone grossly intact throughout  PSYCH: normal affect, pleasant and cooperative  ASSESSMENT AND PLAN:  Discussed the following assessment and plan:  PREVENTIVE EXAM: -Discussed and advised all [place] preventive services health task force level A and B recommendations for age, sex and risks. -Advised at least 150 minutes of exercise per week and a healthy diet with avoidance of (less then 1 serving per  week) processed foods, white starches, red meat, fast foods and sweets and consisting of: * 5-9 servings of fresh fruits and vegetables (not corn or potatoes) *nuts and seeds, beans *olives and olive oil *lean meats such as fish and white chicken  *whole grains -labs, studies and vaccines per orders this encounter  Encounter for preventive health examination - Plan: CBC  Screening for depression  Skin rash  Oral lesion  Generalized anxiety disorder  Morbid obesity ([place]) - Plan: Lipid panel  Hyperglycemia - Plan: Hemoglobin A1c  Gastroesophageal reflux disease without esophagitis  -labs -lifestyle recs -topical azole for fungal rash arm -advised she see her dentist right away about the oral lesion  -congratulated and encouraged on lifestyle changes -continue ppi  and cymbalta  Patient advised to return to clinic immediately if symptoms worsen or persist or new concerns.  Patient Instructions  BEFORE YOU LEAVE: -depression screen -labs -follow up: 4-6 months  For the skin rash - lotrimin twice daily for 3-4 weeks, follow up if persists.  See your dentist right away about the mouth issue.  We have ordered labs or studies at this visit. It can take up to 1-2 weeks for results and processing. IF results require follow up or explanation, we will call you with instructions. Clinically stable results will be released to your [facility]. If you have not heard from [place] or cannot find your results in [facility] in 2 weeks please contact our office at [phone].  If you are not yet signed up for [facility], please consider signing up.   Preventive Care 18-39 Years, Female Preventive care refers to lifestyle choices and visits with your health care provider that can promote health and wellness. What does preventive care include?  A yearly physical exam. This is also called an annual well check.  Dental exams once or twice a year.  Routine eye exams. Ask your health care provider how  often you should have your eyes checked.  Personal lifestyle choices, including: ? Daily care of your teeth and gums. ? Regular physical activity. ? Eating a healthy diet. ? Avoiding tobacco and drug use. ? Limiting alcohol use. ? Practicing safe sex. ? Taking vitamin and mineral supplements as recommended by your health care provider. What happens during an annual well check? The services and screenings done by your health care provider during your annual well check will depend on your age, overall health, lifestyle risk factors, and family history of disease. Counseling Your health care provider may ask you questions about your:  Alcohol use.  Tobacco use.  Drug use.  Emotional well-being.  Home and relationship well-being.  Sexual activity.  Eating habits.  Work and work Statistician.  Method of birth control.  Menstrual cycle.  Pregnancy history.  Screening You may have the following tests or measurements:  Height, weight, and BMI.  Diabetes screening. This is done by checking your blood sugar (glucose) after you have not eaten for a while (fasting).  Blood pressure.  Lipid and cholesterol levels. These may be checked every 5 years starting at age 70.  Skin check.  Hepatitis C blood test.  Hepatitis B blood test.  Sexually transmitted disease (STD) testing.  BRCA-related cancer screening. This may be done if you have a family history of breast, ovarian, tubal, or peritoneal cancers.  Pelvic exam and Pap test. This may be done every 3 years starting at age 5. Starting at age 2, this may be done every 5 years if you have a Pap test in combination with an HPV test.  Discuss your test results, treatment options, and if necessary, the need for more tests with your health care provider. Vaccines Your health care provider may recommend certain vaccines, such as:  Influenza vaccine. This is recommended every year.  Tetanus, diphtheria, and acellular  pertussis (Tdap, Td) vaccine. You may need a Td booster every 10 years.  Varicella vaccine. You may need this if you have not been vaccinated.  HPV vaccine. If you are 22 or younger, you may need three doses over 6 months.  Measles, mumps, and rubella (MMR) vaccine. You may need at least one dose of MMR. You may also need a second dose.  Pneumococcal 13-valent conjugate (PCV13) vaccine. You may need this  if you have certain conditions and were not previously vaccinated.  Pneumococcal polysaccharide (PPSV23) vaccine. You may need one or two doses if you smoke cigarettes or if you have certain conditions.  Meningococcal vaccine. One dose is recommended if you are age 29-21 years and a first-year college student living in a residence hall, or if you have one of several medical conditions. You may also need additional booster doses.  Hepatitis A vaccine. You may need this if you have certain conditions or if you travel or work in places where you may be exposed to hepatitis A.  Hepatitis B vaccine. You may need this if you have certain conditions or if you travel or work in places where you may be exposed to hepatitis B.  Haemophilus influenzae type b (Hib) vaccine. You may need this if you have certain risk factors.  Talk to your health care provider about which screenings and vaccines you need and how often you need them. This information is not intended to replace advice given to you by your health care provider. Make sure you discuss any questions you have with your health care provider. Document Released: 06/19/2001 Document Revised: 01/11/2016 Document Reviewed: 02/22/2015 Elsevier Interactive Patient Education  2018 [employer].         No Follow-up on file.  Lucretia Kern, DO

## 2017-06-03 ENCOUNTER — Ambulatory Visit (INDEPENDENT_AMBULATORY_CARE_PROVIDER_SITE_OTHER): Payer: 59 | Admitting: Family Medicine

## 2017-06-03 ENCOUNTER — Encounter: Payer: Self-pay | Admitting: Family Medicine

## 2017-06-03 VITALS — BP 100/80 | HR 83 | Temp 97.9°F | Ht 68.0 in | Wt 373.8 lb

## 2017-06-03 DIAGNOSIS — R739 Hyperglycemia, unspecified: Secondary | ICD-10-CM

## 2017-06-03 DIAGNOSIS — K1370 Unspecified lesions of oral mucosa: Secondary | ICD-10-CM

## 2017-06-03 DIAGNOSIS — Z0001 Encounter for general adult medical examination with abnormal findings: Secondary | ICD-10-CM | POA: Diagnosis not present

## 2017-06-03 DIAGNOSIS — R21 Rash and other nonspecific skin eruption: Secondary | ICD-10-CM

## 2017-06-03 DIAGNOSIS — Z1331 Encounter for screening for depression: Secondary | ICD-10-CM | POA: Diagnosis not present

## 2017-06-03 DIAGNOSIS — K219 Gastro-esophageal reflux disease without esophagitis: Secondary | ICD-10-CM | POA: Diagnosis not present

## 2017-06-03 DIAGNOSIS — F411 Generalized anxiety disorder: Secondary | ICD-10-CM

## 2017-06-03 DIAGNOSIS — Z0000 Encounter for general adult medical examination without abnormal findings: Secondary | ICD-10-CM

## 2017-06-03 LAB — LIPID PANEL
CHOLESTEROL: 202 mg/dL — AB (ref 0–200)
HDL: 50.1 mg/dL (ref >39.00)
LDL CALC: 135 mg/dL — AB (ref 0–99)
NonHDL: 151.83
TRIGLYCERIDES: 85 mg/dL (ref 0.0–149.0)
Total CHOL/HDL Ratio: 4
VLDL: 17 mg/dL (ref 0.0–40.0)

## 2017-06-03 LAB — CBC
HEMATOCRIT: 42 % (ref 36.0–46.0)
Hemoglobin: 13.7 g/dL (ref 12.0–15.0)
MCHC: 32.5 g/dL (ref 30.0–36.0)
MCV: 88 fl (ref 78.0–100.0)
Platelets: 356 10*3/uL (ref 150.0–400.0)
RBC: 4.78 Mil/uL (ref 3.87–5.11)
RDW: 15 % (ref 11.5–15.5)
WBC: 8.3 10*3/uL (ref 4.0–10.5)

## 2017-06-03 LAB — HEMOGLOBIN A1C: HEMOGLOBIN A1C: 5.9 % (ref 4.6–6.5)

## 2017-06-03 NOTE — Patient Instructions (Signed)
BEFORE YOU LEAVE: -depression screen -labs -follow up: 4-6 months  For the skin rash - lotrimin twice daily for 3-4 weeks, follow up if persists.  See your dentist right away about the mouth issue.  We have ordered labs or studies at this visit. It can take up to 1-2 weeks for results and processing. IF results require follow up or explanation, we will call you with instructions. Clinically stable results will be released to your [facility]. If you have not heard from [place] or cannot find your results in [facility] in 2 weeks please contact our office at [phone].  If you are not yet signed up for [facility], please consider signing up.   Preventive Care 18-39 Years, Female Preventive care refers to lifestyle choices and visits with your health care provider that can promote health and wellness. What does preventive care include?  A yearly physical exam. This is also called an annual well check.  Dental exams once or twice a year.  Routine eye exams. Ask your health care provider how often you should have your eyes checked.  Personal lifestyle choices, including: ? Daily care of your teeth and gums. ? Regular physical activity. ? Eating a healthy diet. ? Avoiding tobacco and drug use. ? Limiting alcohol use. ? Practicing safe sex. ? Taking vitamin and mineral supplements as recommended by your health care provider. What happens during an annual well check? The services and screenings done by your health care provider during your annual well check will depend on your age, overall health, lifestyle risk factors, and family history of disease. Counseling Your health care provider may ask you questions about your:  Alcohol use.  Tobacco use.  Drug use.  Emotional well-being.  Home and relationship well-being.  Sexual activity.  Eating habits.  Work and work Statistician.  Method of birth control.  Menstrual cycle.  Pregnancy history.  Screening You may have the following  tests or measurements:  Height, weight, and BMI.  Diabetes screening. This is done by checking your blood sugar (glucose) after you have not eaten for a while (fasting).  Blood pressure.  Lipid and cholesterol levels. These may be checked every 5 years starting at age 61.  Skin check.  Hepatitis C blood test.  Hepatitis B blood test.  Sexually transmitted disease (STD) testing.  BRCA-related cancer screening. This may be done if you have a family history of breast, ovarian, tubal, or peritoneal cancers.  Pelvic exam and Pap test. This may be done every 3 years starting at age 74. Starting at age 40, this may be done every 5 years if you have a Pap test in combination with an HPV test.  Discuss your test results, treatment options, and if necessary, the need for more tests with your health care provider. Vaccines Your health care provider may recommend certain vaccines, such as:  Influenza vaccine. This is recommended every year.  Tetanus, diphtheria, and acellular pertussis (Tdap, Td) vaccine. You may need a Td booster every 10 years.  Varicella vaccine. You may need this if you have not been vaccinated.  HPV vaccine. If you are 82 or younger, you may need three doses over 6 months.  Measles, mumps, and rubella (MMR) vaccine. You may need at least one dose of MMR. You may also need a second dose.  Pneumococcal 13-valent conjugate (PCV13) vaccine. You may need this if you have certain conditions and were not previously vaccinated.  Pneumococcal polysaccharide (PPSV23) vaccine. You may need one or two doses if  you smoke cigarettes or if you have certain conditions.  Meningococcal vaccine. One dose is recommended if you are age 16-21 years and a first-year college student living in a residence hall, or if you have one of several medical conditions. You may also need additional booster doses.  Hepatitis A vaccine. You may need this if you have certain conditions or if you travel  or work in places where you may be exposed to hepatitis A.  Hepatitis B vaccine. You may need this if you have certain conditions or if you travel or work in places where you may be exposed to hepatitis B.  Haemophilus influenzae type b (Hib) vaccine. You may need this if you have certain risk factors.  Talk to your health care provider about which screenings and vaccines you need and how often you need them. This information is not intended to replace advice given to you by your health care provider. Make sure you discuss any questions you have with your health care provider. Document Released: 06/19/2001 Document Revised: 01/11/2016 Document Reviewed: 02/22/2015 Elsevier Interactive Patient Education  [employer].

## 2017-07-05 MED FILL — NORETHIND-ETH ESTRAD 1-0.02: 1-20 | 84 days supply | Qty: 63 | Fill #1

## 2017-08-19 ENCOUNTER — Other Ambulatory Visit: Payer: Self-pay | Admitting: Family Medicine

## 2017-08-19 DIAGNOSIS — F411 Generalized anxiety disorder: Secondary | ICD-10-CM

## 2017-08-20 MED FILL — DULoxetine HCL 60 MG CPEP: 60 | 90 days supply | Qty: 90 | Fill #0

## 2017-08-20 MED FILL — OMEPRAZOLE 20 MG CAP: 20 | 90 days supply | Qty: 90 | Fill #0

## 2017-09-02 DIAGNOSIS — F331 Major depressive disorder, recurrent, moderate: Secondary | ICD-10-CM | POA: Diagnosis not present

## 2017-09-10 DIAGNOSIS — F331 Major depressive disorder, recurrent, moderate: Secondary | ICD-10-CM | POA: Diagnosis not present

## 2017-09-19 ENCOUNTER — Encounter: Payer: Self-pay | Admitting: Family Medicine

## 2017-09-19 ENCOUNTER — Ambulatory Visit (INDEPENDENT_AMBULATORY_CARE_PROVIDER_SITE_OTHER): Payer: 59 | Admitting: Family Medicine

## 2017-09-19 VITALS — BP 108/80 | HR 95 | Temp 98.4°F | Ht 68.0 in | Wt 379.6 lb

## 2017-09-19 DIAGNOSIS — J358 Other chronic diseases of tonsils and adenoids: Secondary | ICD-10-CM | POA: Diagnosis not present

## 2017-09-19 DIAGNOSIS — R0982 Postnasal drip: Secondary | ICD-10-CM

## 2017-09-19 NOTE — Patient Instructions (Signed)
Nasal saline as we discussed.  Flonase 2 sprays each nostril daily for 1 month, then 1 spray each nostril daily if continued after that.  I hope you are feeling better soon! Seek care promptly if your symptoms worsen, new concerns arise or you are not improving with treatment.

## 2017-09-19 NOTE — Progress Notes (Signed)
HPI:  Using dictation device. Unfortunately this device frequently misinterprets words/phrases.   Acute visit for sore throat -started:the last few days more on the R -sometimes mouth feels dry for 1 month, a little sinus issues - headache on and off -denies:fever, SOB, NVD, tooth pain, body aches -has tried: nothing -sick contacts/travel/risks: no reported flu, strep or tick exposure - friend with a cold  ROS: See pertinent positives and negatives per HPI.  Past Medical History:  Diagnosis Date  . Anxiety   . Asthma   . Hyperglycemia   . Hyperlipemia   . Morbid obesity ([place])    sees weight loss clinic  . Nevus    sees dermatologist, Dr. Tonia Brooms  . Obstructive sleep apnea on CPAP     Past Surgical History:  Procedure Laterality Date  . finger laceration Right 2008   2nd finger  . implanon insertion  06-26-11   left upper arm  . MOLE REMOVAL    . OTHER SURGICAL HISTORY Left [id number]    Implanon  . WISDOM TOOTH EXTRACTION      Family History  Problem Relation Age of Onset  . Atrial fibrillation Mother   . Diabetes Father   . Cancer Father        skin  . Polycystic ovary syndrome Sister   . Depression Sister   . Anxiety disorder Sister   . Bipolar disorder Sister   . Cancer Maternal Grandfather        skin  . Dementia Maternal Grandmother     Social History   Socioeconomic History  . Marital status: Married    Spouse name: Mortimer Fries  . Number of children: 0  . Years of education: 80  . Highest education level: Not on file  Occupational History  . Occupation: LEGAL ASSISTANT   . Occupation: Conservator, museum/gallery: [employer]  Social Needs  . Financial resource strain: Not on file  . Food insecurity:    Worry: Not on file    Inability: Not on file  . Transportation needs:    Medical: Not on file    Non-medical: Not on file  Tobacco Use  . Smoking status: Never Smoker  . Smokeless tobacco: Never Used  Substance and Sexual Activity  .  Alcohol use: Yes    Alcohol/week: 0.0 - 0.6 oz  . Drug use: No  . Sexual activity: Yes    Partners: Male    Birth control/protection: Pill  Lifestyle  . Physical activity:    Days per week: Not on file    Minutes per session: Not on file  . Stress: Not on file  Relationships  . Social connections:    Talks on phone: Not on file    Gets together: Not on file    Attends religious service: Not on file    Active member of club or organization: Not on file    Attends meetings of clubs or organizations: Not on file    Relationship status: Not on file  Other Topics Concern  . Not on file  Social History Narrative      Marital Status: Married Chief Operating Officer)    Children:  None    Pets:  5 dogs   Living Situation: Lives with his husband.   Occupation: Civil Service fast streamer     Education: Forensic psychologist ([place]) [place])    Alcohol Use:  Occasional   Tobacco Use:  None    Drug Use:  None   Diet:  Regular   Exercise:  None   Hobbies:  Reading, Movies               Current Outpatient Medications:  .  DULoxetine (CYMBALTA) 60 MG capsule, TAKE 1 CAPSULE BY MOUTH DAILY. PATIENT NEEDS AN APPOINTMENT, Disp: 90 capsule, Rfl: 0 .  ibuprofen (ADVIL,MOTRIN) 200 MG tablet, Take 3 tablets (600 mg total) by mouth every 6 (six) hours as needed for moderate pain., Disp: 30 tablet, Rfl: 0 .  norethindrone-ethinyl estradiol (MICROGESTIN,JUNEL,LOESTRIN) 1-20 MG-MCG tablet, Take 1 tablet by mouth daily., Disp: 3 Package, Rfl: 4 .  omeprazole (PRILOSEC) 20 MG capsule, TAKE 1 CAPSULE BY MOUTH DAILY. PATIENT NEEDS APPOINTMENT, Disp: 90 capsule, Rfl: 0  EXAM:  Vitals:   09/19/17 0833  BP: 108/80  Pulse: 95  Temp: 98.4 F (36.9 C)    Body mass index is 57.72 kg/m.  GENERAL: vitals reviewed and listed above, alert, oriented, appears well hydrated and in no acute distress  HEENT: atraumatic, conjunttiva clear, no obvious abnormalities on inspection of external nose and  ears, normal appearance of ear canals and TMs, clear nasal congestion, mild post oropharyngeal erythema with PND, no tonsillar edema or exudate, tonsillith R, no sinus TTP  NECK: no obvious masses on inspection  LUNGS: clear to auscultation bilaterally, no wheezes, rales or rhonchi, good air movemen  CV: HRRR, no peripheral edema  MS: moves all extremities without noticeable abnormality  PSYCH: pleasant and cooperative, no obvious depression or anxiety  ASSESSMENT AND PLAN:  Discussed the following assessment and plan:  No diagnosis found.  -given HPI and exam findings today, a serious infection or illness is unlikely. We discussed potential etiologies, with AR and/or VURI with PND and tonsillith being most likely. We discussed treatment side effects, likely course, antibiotic misuse, transmission, and signs of developing a serious illness. -discussed management of tonsilliths - controlling PND, water pick, other methods of removal, ENT options, etc -she is to follow up if worsening or not improving -of course, we advised to return or notify a doctor immediately if symptoms worsen or persist or new concerns arise.    There are no Patient Instructions on file for this visit.  Lucretia Kern, DO

## 2017-10-01 DIAGNOSIS — F331 Major depressive disorder, recurrent, moderate: Secondary | ICD-10-CM | POA: Diagnosis not present

## 2017-10-15 DIAGNOSIS — F331 Major depressive disorder, recurrent, moderate: Secondary | ICD-10-CM | POA: Diagnosis not present

## 2017-10-16 DIAGNOSIS — G4733 Obstructive sleep apnea (adult) (pediatric): Secondary | ICD-10-CM | POA: Diagnosis not present

## 2017-10-29 DIAGNOSIS — F331 Major depressive disorder, recurrent, moderate: Secondary | ICD-10-CM | POA: Diagnosis not present

## 2017-11-12 DIAGNOSIS — F331 Major depressive disorder, recurrent, moderate: Secondary | ICD-10-CM | POA: Diagnosis not present

## 2017-11-18 ENCOUNTER — Other Ambulatory Visit: Payer: Self-pay | Admitting: Family Medicine

## 2017-11-18 DIAGNOSIS — F411 Generalized anxiety disorder: Secondary | ICD-10-CM

## 2017-11-18 MED ORDER — DULOXETINE HCL 60 MG PO CPEP: 60.0000 mg | ORAL_CAPSULE | Freq: Every day | ORAL | 0 refills | Status: DC

## 2017-11-18 MED ORDER — OMEPRAZOLE 20 MG PO CPDR: 20.0000 mg | DELAYED_RELEASE_CAPSULE | Freq: Every day | ORAL | 1 refills | Status: DC

## 2017-11-18 NOTE — Telephone Encounter (Signed)
Please let pt know - our refill protocol is 90 days with 1 refill for most medications for a stable issue. Her last follow up visit was in January. We usually advise follow up every 3-6 months depending on the condition for prescription medications. If follow up is past due, then the refills nurse will only send one refill. Please send 90 days. Needs follow up in the next 3 months. Thanks.

## 2017-11-19 MED FILL — OMEPRAZOLE 20 MG CPDR: 20 | 90 days supply | Qty: 90 | Fill #0

## 2017-11-19 MED FILL — DULoxetine HCL 60 MG CPEP: 60 | 90 days supply | Qty: 90 | Fill #0

## 2017-11-28 NOTE — Progress Notes (Signed)
HPI:  Using dictation device. Unfortunately this device frequently misinterprets words/phrases.  Mary Brandt is a pleasant 32 y.o. here for follow up. Chronic medical problems summarized below were reviewed for changes. Reports she has been feeling well with no depression and anxiety is well controlled.  Unfortunately, she reports her diet has been "not good".  She has been traveling a lot for work.  She feels this is made eating healthy and regular exercise challenging.  However, she is doing a program through work and is to make some changes.  One challenge is that when she is driving she tends to eat poorly.  She is considering packing a lunch.  Denies CP, SOB, DOE, treatment intolerance or new symptoms. Not fasting today.  Anxiety: -medications: cymbalta -Reports mood is good  Hyperglycemia/obesity/Hyperlip: -lifestyle recs advised -Doing weight watchers, trying to walk her dog on a regular basis -wt 374 (1/18) -->  373 (1/19) --> 392 (7/19)  GERD: -meds: PPI -Bad heartburn if she stops medicine  OSA: -seeing pulmonology   ROS: See pertinent positives and negatives per HPI.  Past Medical History:  Diagnosis Date  . Anxiety   . Asthma   . Hyperglycemia   . Hyperlipemia   . Morbid obesity ([place])    sees weight loss clinic  . Nevus    sees dermatologist, Dr. Tonia Brooms  . Obstructive sleep apnea on CPAP     Past Surgical History:  Procedure Laterality Date  . finger laceration Right 2008   2nd finger  . implanon insertion  06-26-11   left upper arm  . MOLE REMOVAL    . OTHER SURGICAL HISTORY Left [id number]    Implanon  . WISDOM TOOTH EXTRACTION      Family History  Problem Relation Age of Onset  . Atrial fibrillation Mother   . Diabetes Father   . Cancer Father        skin  . Polycystic ovary syndrome Sister   . Depression Sister   . Anxiety disorder Sister   . Bipolar disorder Sister   . Cancer Maternal Grandfather        skin  . Dementia Maternal  Grandmother     SOCIAL HX: see hpi   Current Outpatient Medications:  .  DULoxetine (CYMBALTA) 60 MG capsule, Take 1 capsule (60 mg total) by mouth daily., Disp: 90 capsule, Rfl: 0 .  ibuprofen (ADVIL,MOTRIN) 200 MG tablet, Take 3 tablets (600 mg total) by mouth every 6 (six) hours as needed for moderate pain., Disp: 30 tablet, Rfl: 0 .  norethindrone-ethinyl estradiol (MICROGESTIN,JUNEL,LOESTRIN) 1-20 MG-MCG tablet, Take 1 tablet by mouth daily., Disp: 3 Package, Rfl: 4 .  omeprazole (PRILOSEC) 20 MG capsule, Take 1 capsule (20 mg total) by mouth daily., Disp: 90 capsule, Rfl: 1  EXAM:  Vitals:   12/02/17 0810  BP: 108/78  Pulse: 79  Temp: 97.7 F (36.5 C)    Body mass index is 59.65 kg/m.  GENERAL: vitals reviewed and listed above, alert, oriented, appears well hydrated and in no acute distress  HEENT: atraumatic, conjunttiva clear, no obvious abnormalities on inspection of external nose and ears  NECK: no obvious masses on inspection  LUNGS: clear to auscultation bilaterally, no wheezes, rales or rhonchi, good air movement  CV: HRRR, no peripheral edema  MS: moves all extremities without noticeable abnormality  PSYCH: pleasant and cooperative, no obvious depression or anxiety  ASSESSMENT AND PLAN:  Discussed the following assessment and plan:  Morbid obesity (HCC)  Anxiety  Hyperglycemia -  Plan: Hemoglobin A1c  Hyperlipidemia, unspecified hyperlipidemia type - Plan: HDL cholesterol, Cholesterol, total  -Nonfasting labs given weight gain -Lifestyle recommendations discussed at length, recommended a healthy low sugar diet and regular aerobic exercise.  Recommended making small but significant changes each month to her lifestyle that she can stick with her however.  Recommended a goal of a healthy Mediterranean style diet and regular aerobic exercise to be reached by the end of 1 year.  We discussed a weight loss challenge goal and decided on 10 pounds for the next  3 months. Follow-up 3 months.  Sooner as needed.  Patient Instructions  BEFORE YOU LEAVE: -labs -follow up: 3 months  10 lb weight reduction goal for the next 3 months. Eat a healthy low sugar diet and get regular aerobic exercise. Try packing a healthy lunch when you travel.  We have ordered labs or studies at this visit. It can take up to 1-2 weeks for results and processing. IF results require follow up or explanation, we will call you with instructions. Clinically stable results will be released to your [facility]. If you have not heard from [place] or cannot find your results in [facility] in 2 weeks please contact our office at [phone].  If you are not yet signed up for [facility], please consider signing up.   We recommend the following healthy lifestyle for LIFE: 1) Small portions. But, make sure to get regular (at least 3 per day), healthy meals and small healthy snacks if needed.  2) Eat a healthy clean diet.   TRY TO EAT: -at least 5-7 servings of low sugar, colorful, and nutrient rich vegetables per day (not corn, potatoes or bananas.) -berries are the best choice if you wish to eat fruit (only eat small amounts if trying to reduce weight)  -lean meets (fish, white meat of chicken or [place]) -vegan proteins for some meals - beans or tofu, whole grains, nuts and seeds -Replace bad fats with good fats - good fats include: fish, nuts and seeds, canola oil, olive oil -small amounts of low fat or non fat dairy -small amounts of100 % whole grains - check the lables -drink plenty of water  AVOID: -SUGAR, sweets, anything with added sugar, corn syrup or sweeteners - must read labels as even foods advertised as "healthy" often are loaded with sugar -if you must have a sweetener, small amounts of stevia may be best -sweetened beverages and artificially sweetened beverages -simple starches (rice, bread, potatoes, pasta, chips, etc - small amounts of 100% whole grains are ok) -red meat,  pork, butter -fried foods, fast food, processed food, excessive dairy, eggs and coconut.  3)Get at least 150 minutes of sweaty aerobic exercise per week.  4)Reduce stress - consider counseling, meditation and relaxation to balance other aspects of your life.            Lucretia Kern, DO

## 2017-12-02 ENCOUNTER — Encounter: Payer: Self-pay | Admitting: Family Medicine

## 2017-12-02 ENCOUNTER — Ambulatory Visit (INDEPENDENT_AMBULATORY_CARE_PROVIDER_SITE_OTHER): Payer: 59 | Admitting: Family Medicine

## 2017-12-02 DIAGNOSIS — F419 Anxiety disorder, unspecified: Secondary | ICD-10-CM | POA: Diagnosis not present

## 2017-12-02 DIAGNOSIS — R739 Hyperglycemia, unspecified: Secondary | ICD-10-CM | POA: Diagnosis not present

## 2017-12-02 DIAGNOSIS — F331 Major depressive disorder, recurrent, moderate: Secondary | ICD-10-CM | POA: Diagnosis not present

## 2017-12-02 DIAGNOSIS — E785 Hyperlipidemia, unspecified: Secondary | ICD-10-CM | POA: Diagnosis not present

## 2017-12-02 LAB — HDL CHOLESTEROL: HDL: 57.1 mg/dL (ref >39.00)

## 2017-12-02 LAB — CHOLESTEROL, TOTAL
Cholesterol: 176 mg/dL (ref 0–200)
Cholesterol: 176 mg/dL (ref 0–200)

## 2017-12-02 LAB — HEMOGLOBIN A1C: HEMOGLOBIN A1C: 6 % (ref 4.6–6.5)

## 2017-12-02 NOTE — Patient Instructions (Signed)
BEFORE YOU LEAVE: -labs -follow up: 3 months  10 lb weight reduction goal for the next 3 months. Eat a healthy low sugar diet and get regular aerobic exercise. Try packing a healthy lunch when you travel.  We have ordered labs or studies at this visit. It can take up to 1-2 weeks for results and processing. IF results require follow up or explanation, we will call you with instructions. Clinically stable results will be released to your [facility]. If you have not heard from [place] or cannot find your results in [facility] in 2 weeks please contact our office at [phone].  If you are not yet signed up for [facility], please consider signing up.   We recommend the following healthy lifestyle for LIFE: 1) Small portions. But, make sure to get regular (at least 3 per day), healthy meals and small healthy snacks if needed.  2) Eat a healthy clean diet.   TRY TO EAT: -at least 5-7 servings of low sugar, colorful, and nutrient rich vegetables per day (not corn, potatoes or bananas.) -berries are the best choice if you wish to eat fruit (only eat small amounts if trying to reduce weight)  -lean meets (fish, white meat of chicken or [place]) -vegan proteins for some meals - beans or tofu, whole grains, nuts and seeds -Replace bad fats with good fats - good fats include: fish, nuts and seeds, canola oil, olive oil -small amounts of low fat or non fat dairy -small amounts of100 % whole grains - check the lables -drink plenty of water  AVOID: -SUGAR, sweets, anything with added sugar, corn syrup or sweeteners - must read labels as even foods advertised as "healthy" often are loaded with sugar -if you must have a sweetener, small amounts of stevia may be best -sweetened beverages and artificially sweetened beverages -simple starches (rice, bread, potatoes, pasta, chips, etc - small amounts of 100% whole grains are ok) -red meat, pork, butter -fried foods, fast food, processed food, excessive dairy, eggs  and coconut.  3)Get at least 150 minutes of sweaty aerobic exercise per week.  4)Reduce stress - consider counseling, meditation and relaxation to balance other aspects of your life.

## 2017-12-16 DIAGNOSIS — F331 Major depressive disorder, recurrent, moderate: Secondary | ICD-10-CM | POA: Diagnosis not present

## 2017-12-20 ENCOUNTER — Encounter: Payer: Self-pay | Admitting: Family Medicine

## 2017-12-23 ENCOUNTER — Telehealth: Payer: Self-pay | Admitting: Certified Nurse Midwife

## 2017-12-23 NOTE — Telephone Encounter (Signed)
Call to patient to follow up on MyChart message as seen below. Patient states that she stopped her Junel about 3 months ago due to hormonal side effects. Patient states she had 1 period as normal, followed by a heavier period. Has now missed 2 periods. States she kept track of it and cycle should have started 12-20-17. Took a UPT last week that was negative. RN offered OV to discussed missed periods after stopping OCP. Patient agreeable. Patient scheduled for 12-25-17 at 1000. Patient agreeable to date and time of appointment.   Routing to provider for final review. Patient agreeable to disposition. Will close encounter.

## 2017-12-23 NOTE — Telephone Encounter (Signed)
Mary Brandt:    I decided to stop taking my birth control about three months ago, not because Im trying to get pregnant, but just because Im tired of the hormonal side effects. I stopped taking it on my off week when I was supposed to have my period and had a period then. If I remember correctly I think I also had a very heavy period the next month on schedule and then Ive missed two since then. I took a pregnancy test just to be safe a few days ago and it was negative. I wanted to check with you to make sure this is normal or if you thought I should get checked out. This is my first time being off birth control totally in probably 15 years, but Donnald Garre always consistently had a period on it, albeit a very light one. Thank you!    [name]

## 2017-12-25 ENCOUNTER — Other Ambulatory Visit: Payer: Self-pay

## 2017-12-25 ENCOUNTER — Ambulatory Visit (INDEPENDENT_AMBULATORY_CARE_PROVIDER_SITE_OTHER): Payer: 59 | Admitting: Certified Nurse Midwife

## 2017-12-25 ENCOUNTER — Ambulatory Visit: Payer: 59 | Admitting: Certified Nurse Midwife

## 2017-12-25 ENCOUNTER — Encounter: Payer: Self-pay | Admitting: Certified Nurse Midwife

## 2017-12-25 VITALS — BP 116/78 | HR 70 | Resp 16 | Ht 68.0 in | Wt 396.0 lb

## 2017-12-25 DIAGNOSIS — Z3202 Encounter for pregnancy test, result negative: Secondary | ICD-10-CM | POA: Diagnosis not present

## 2017-12-25 DIAGNOSIS — R635 Abnormal weight gain: Secondary | ICD-10-CM

## 2017-12-25 DIAGNOSIS — N912 Amenorrhea, unspecified: Secondary | ICD-10-CM

## 2017-12-25 LAB — POCT URINE PREGNANCY: PREG TEST UR: NEGATIVE

## 2017-12-25 NOTE — Progress Notes (Signed)
  Subjective:     Patient ID: Mary Brandt, female   DOB: October 26, 1985, 32 y.o.   MRN: [id number]  32 yo gopo whitemarried female here complaining of no periods since June. Last two periods were very heavy and she decided to stop OCP. Has missed period July and August. Here to make sure all OK. Not using contraception at present, would be [place] if pregnant but not trying. 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 Not happy about weight gain, but will work it out. No other health issues today.    Review of Systems  Constitutional: Negative.   HENT: Negative.   Eyes: Negative.   Respiratory: Negative.   Cardiovascular: Negative.   Gastrointestinal: Negative.   Endocrine: Negative.   Genitourinary:       Menstrual cycle changes  Musculoskeletal: Negative.   Skin: Negative.   Allergic/Immunologic: Negative.   Neurological: Negative.   Hematological: Negative.   Psychiatric/Behavioral: Negative.    upt-neg    Objective:   Physical Exam  Constitutional: She is oriented to person, place, and time. She appears well-developed and well-nourished.  Abdominal: Soft. She exhibits no mass.  Genitourinary: Vagina normal and uterus normal. Pelvic exam was performed with patient supine. There is no rash, tenderness or lesion on the right labia. There is no rash, tenderness or lesion on the left labia. Cervix exhibits no motion tenderness and no discharge. Right adnexum displays no tenderness and no fullness. Left adnexum displays no tenderness and no fullness. No tenderness or bleeding in the vagina. No vaginal discharge found.  Genitourinary Comments: Exam limited due to morbid obesity and weight gain  Lymphadenopathy: No inguinal adenopathy noted on the right or left side.  Neurological: She is alert and oriented to person, place, and time.  Skin: Skin is warm and dry.  Psychiatric: She has a normal mood and affect. Her behavior is normal. Judgment and  thought content normal.       Assessment:     Amenorrhea negative UPT today Normal pelvic exam Morbid obesity with weight gain Contraception stopped OCP, occasional condom use Pregnancy would be welcome,but not actively trying    Plan:     Discussed normal pelvic exam finding. Discussed after going off OCP periods can change and also can be influenced by weight change,thyroid and hormonal change. Recommend assessment with labs today. Patient agreeable Labs: TSH,FSH,Prolactin If normal repeat HCG serum in 2 weeks and if negative proceed with Provera challenge.  Discussed POP use to help with heavy periods, patient will consider. Questions addressed.  Rv 2 weeks for lab order placed for HCG qualtative.Marland Kitchen

## 2017-12-25 NOTE — Patient Instructions (Signed)
Secondary Amenorrhea Secondary amenorrhea is the stopping of menstrual flow for 3-6 months in a female who has previously had periods. There are many possible causes. Most of these causes are not serious. Usually, treating the underlying problem causing the loss of menses will return your periods to normal. What are the causes? Some common and uncommon causes of not menstruating include:  Malnutrition.  Low blood sugar (hypoglycemia).  Polycystic ovary disease.  Stress or fear.  Breastfeeding.  Hormone imbalance.  Ovarian failure.  Medicines.  Extreme obesity.  Cystic fibrosis.  Low body weight or drastic weight reduction from any cause.  Early menopause.  Removal of ovaries or uterus.  Contraceptives.  Illness.  Long-term (chronic) illnesses.  Cushing syndrome.  Thyroid problems.  Birth control pills, patches, or vaginal rings for birth control.  What increases the risk? You may be at greater risk of secondary amenorrhea if:  You have a family history of this condition.  You have an eating disorder.  You do athletic training.  How is this diagnosed? A diagnosis is made by your health care provider taking a medical history and doing a physical exam. This will include a pelvic exam to check for problems with your reproductive organs. Pregnancy must be ruled out. Often, numerous blood tests are done to measure different hormones in the body. Urine testing may be done. Specialized exams (ultrasound, CT scan, MRI, or hysteroscopy) may have to be done as well as measuring the body mass index (BMI). How is this treated? Treatment depends on the cause of the amenorrhea. If an eating disorder is present, this can be treated with an adequate diet and therapy. Chronic illnesses may improve with treatment of the illness. Amenorrhea may be corrected with medicines, lifestyle changes, or surgery. If the amenorrhea cannot be corrected, it is sometimes possible to create a  false menstruation with medicines. Follow these instructions at home:  Maintain a healthy diet.  Manage weight problems.  Exercise regularly but not excessively.  Get adequate sleep.  Manage stress.  Be aware of changes in your menstrual cycle. Keep a record of when your periods occur. Note the date your period starts, how long it lasts, and any problems. Contact a health care provider if: Your symptoms do not get better with treatment. This information is not intended to replace advice given to you by your health care provider. Make sure you discuss any questions you have with your health care provider. Document Released: 06/04/2006 Document Revised: 09/29/2015 Document Reviewed: 10/09/2012 Elsevier Interactive Patient Education  2018 Elsevier Inc.  

## 2017-12-26 LAB — TSH: TSH: 1.1 u[IU]/mL (ref 0.450–4.500)

## 2017-12-26 LAB — FOLLICLE STIMULATING HORMONE: FSH: 4.7 m[IU]/mL

## 2017-12-26 LAB — PROLACTIN: Prolactin: 10.6 ng/mL (ref 4.8–23.3)

## 2017-12-30 DIAGNOSIS — F331 Major depressive disorder, recurrent, moderate: Secondary | ICD-10-CM | POA: Diagnosis not present

## 2018-01-08 ENCOUNTER — Other Ambulatory Visit (INDEPENDENT_AMBULATORY_CARE_PROVIDER_SITE_OTHER): Payer: 59

## 2018-01-08 DIAGNOSIS — N912 Amenorrhea, unspecified: Secondary | ICD-10-CM | POA: Diagnosis not present

## 2018-01-09 ENCOUNTER — Other Ambulatory Visit: Payer: Self-pay

## 2018-01-09 LAB — HCG, SERUM, QUALITATIVE: HCG, BETA SUBUNIT, QUAL, SERUM: NEGATIVE m[IU]/mL (ref <6)

## 2018-01-09 MED ORDER — MEDROXYPROGESTERONE ACETATE 10 MG PO TABS: 10.0000 mg | ORAL_TABLET | Freq: Every day | ORAL | 0 refills | Status: DC

## 2018-01-09 MED FILL — MEDROXYPROGESTERONE 10 MG T: 10 | 10 days supply | Qty: 10 | Fill #0

## 2018-01-20 ENCOUNTER — Telehealth: Payer: Self-pay | Admitting: Certified Nurse Midwife

## 2018-01-20 NOTE — Telephone Encounter (Signed)
Spoke with patient. Took provera 10 mg daily for 10 days, started on 01/09/18.   9/7: symptoms of blurry vision no resolved with eye drops and depression. Symptoms have continued after completing medication on 9/14. Hx of anxiety, on cymbalta 60mg  daily.   Denies headache, SOB, weakness, SI/HI. No vaginal bleeding.   Advised patient per review of UpToDate "change in eyesight, change in how contact lenses feel in the eyes and low mood" can be side effects of provera. Advised patient if symptoms are not resolving after completing medication, f/u with eye doctor and psychiatrist for further evaluation. Advised Melvia Heaps, CNM is out of the office, I will review with covering provider and return call with any additional recommendations, patient agreeable.   Routing to Dr. Talbert Nan to review.   Cc: Melvia Heaps, CNM

## 2018-01-20 NOTE — Telephone Encounter (Signed)
I think she should be evaluated by for her blurry vision. That is not a common side effect of provera. She should f/u with her Psychiatrist for her mood. Any effects from the provera should resolve shortly after she stops taking it.

## 2018-01-20 NOTE — Telephone Encounter (Signed)
Hello:    For the past week I have had blurry vision (which I have tried to alleviate with drops, taking my contacts out more frequently and changing my contact lenses with no change) and I am also experiencing unusual depression. I have taken cymbalta for about 7 years, mostly for anxiety, and don't feel like I've had any life changes to trigger this. Before I schedule appointments with my eye doctor and psychiatrist I wanted to check in to see if these could potentially be side effects of the hormone I just finished taking or having stopped my birth control? Thank you.    Mary Brandt

## 2018-01-20 NOTE — Telephone Encounter (Signed)
Spoke with patient, advised as seen below per Dr. Jertson. Patient verbalizes understanding and is agreeable. Encounter closed.  

## 2018-01-23 DIAGNOSIS — F331 Major depressive disorder, recurrent, moderate: Secondary | ICD-10-CM | POA: Diagnosis not present

## 2018-02-10 ENCOUNTER — Ambulatory Visit: Payer: Self-pay | Admitting: Family Medicine

## 2018-02-12 NOTE — Progress Notes (Signed)
HPI:  Using dictation device. Unfortunately this device frequently misinterprets words/phrases.  Mary Brandt is a pleasant 32 y.o. here for follow up. Chronic medical problems summarized below were reviewed for changes and stability and were updated as needed below. These issues and their treatment remain stable for the most part.  Reports doing well now. Had stopped birth control, then had amenorrhea for several months and was on provera with gyn - had some mood swings with this. Now reports resolved and doing well. Needs refills on meds. Denies CP, SOB, DOE, treatment intolerance or new symptoms. Due for flu shot  Anxiety: -medications: cymbalta -Reports mood is good  Hyperglycemia/obesity/Hyperlip: -lifestyle recs advised - weight watchers helps, trying to walk her dog on a regular basis -wt 374 (1/18) -->  373 (1/19) --> 392 (7/19) --> 403  GERD: -meds: PPI -Bad heartburn if she stops medicine  OSA: -seeing pulmonology  ROS: See pertinent positives and negatives per HPI.  Past Medical History:  Diagnosis Date  . Anxiety   . Asthma   . Hyperglycemia   . Hyperlipemia   . Morbid obesity ([place])    sees weight loss clinic  . Nevus    sees dermatologist, Dr. Tonia Brooms  . Obstructive sleep apnea on CPAP     Past Surgical History:  Procedure Laterality Date  . finger laceration Right 2008   2nd finger  . implanon insertion  06-26-11   left upper arm  . MOLE REMOVAL    . OTHER SURGICAL HISTORY Left [id number]    Implanon  . WISDOM TOOTH EXTRACTION      Family History  Problem Relation Age of Onset  . Atrial fibrillation Mother   . Diabetes Father   . Cancer Father        skin  . Polycystic ovary syndrome Sister   . Depression Sister   . Anxiety disorder Sister   . Bipolar disorder Sister   . Cancer Maternal Grandfather        skin  . Dementia Maternal Grandmother     SOCIAL HX: see pi   Current Outpatient Medications:  .  DULoxetine (CYMBALTA) 60  MG capsule, Take 1 capsule (60 mg total) by mouth daily., Disp: 90 capsule, Rfl: 1 .  ibuprofen (ADVIL,MOTRIN) 200 MG tablet, Take 3 tablets (600 mg total) by mouth every 6 (six) hours as needed for moderate pain., Disp: 30 tablet, Rfl: 0 .  omeprazole (PRILOSEC) 20 MG capsule, Take 1 capsule (20 mg total) by mouth daily., Disp: 90 capsule, Rfl: 1  EXAM:  Vitals:   02/13/18 0714  BP: 120/80  Pulse: 94  Temp: 97.8 F (36.6 C)  SpO2: 94%    Body mass index is 61.28 kg/m.  GENERAL: vitals reviewed and listed above, alert, oriented, appears well hydrated and in no acute distress  HEENT: atraumatic, conjunttiva clear, no obvious abnormalities on inspection of external nose and ears  NECK: no obvious masses on inspection  LUNGS: clear to auscultation bilaterally, no wheezes, rales or rhonchi, good air movement  CV: HRRR, no peripheral edema  MS: moves all extremities without noticeable abnormality  PSYCH: pleasant and cooperative, no obvious depression or anxiety  ASSESSMENT AND PLAN:  Discussed the following assessment and plan:  Morbid obesity (HCC)  Hyperglycemia  Generalized anxiety disorder - Plan: DULoxetine (CYMBALTA) 60 MG capsule  OSA (obstructive sleep apnea)  -refills sent -flu shot today -lifestyle recs -follow up 3-4 months, sooner as needed  Patient Instructions  BEFORE YOU LEAVE: -flu shot -  follow up: 3-4 months; lets check labs at your next visit  Please work on a low sugar diet and regular exercise. Shoot for a 10 - 20 pound weight reduction over the next 3-6 months.   We recommend the following healthy lifestyle for LIFE: 1) Small portions. But, make sure to get regular (at least 3 per day), healthy meals and small healthy snacks if needed.  2) Eat a healthy clean diet.   TRY TO EAT: -at least 5-7 servings of low sugar, colorful, and nutrient rich vegetables per day (not corn, potatoes or bananas.) -berries are the best choice if you wish to  eat fruit (only eat small amounts if trying to reduce weight)  -lean meets (fish, white meat of chicken or [place]) -vegan proteins for some meals - beans or tofu, whole grains, nuts and seeds -Replace bad fats with good fats - good fats include: fish, nuts and seeds, canola oil, olive oil -small amounts of low fat or non fat dairy -small amounts of100 % whole grains - check the lables -drink plenty of water  AVOID: -SUGAR, sweets, anything with added sugar, corn syrup or sweeteners - must read labels as even foods advertised as "healthy" often are loaded with sugar -if you must have a sweetener, small amounts of stevia may be best -sweetened beverages and artificially sweetened beverages -simple starches (rice, bread, potatoes, pasta, chips, etc - small amounts of 100% whole grains are ok) -red meat, pork, butter -fried foods, fast food, processed food, excessive dairy, eggs and coconut.  3)Get at least 150 minutes of sweaty aerobic exercise per week.  4)Reduce stress - consider counseling, meditation and relaxation to balance other aspects of your life.    Lucretia Kern, DO

## 2018-02-13 ENCOUNTER — Encounter: Payer: Self-pay | Admitting: Family Medicine

## 2018-02-13 ENCOUNTER — Ambulatory Visit (INDEPENDENT_AMBULATORY_CARE_PROVIDER_SITE_OTHER): Payer: 59 | Admitting: Family Medicine

## 2018-02-13 DIAGNOSIS — F411 Generalized anxiety disorder: Secondary | ICD-10-CM | POA: Diagnosis not present

## 2018-02-13 DIAGNOSIS — G4733 Obstructive sleep apnea (adult) (pediatric): Secondary | ICD-10-CM | POA: Diagnosis not present

## 2018-02-13 DIAGNOSIS — Z23 Encounter for immunization: Secondary | ICD-10-CM

## 2018-02-13 DIAGNOSIS — R739 Hyperglycemia, unspecified: Secondary | ICD-10-CM

## 2018-02-13 MED ORDER — DULOXETINE HCL 60 MG PO CPEP: 60.0000 mg | ORAL_CAPSULE | Freq: Every day | ORAL | 1 refills | Status: DC

## 2018-02-13 MED FILL — DULoxetine HCL 60 MG CPEP: 60 | 90 days supply | Qty: 90 | Fill #0

## 2018-02-13 MED FILL — OMEPRAZOLE 20 MG CPDR: 20 | 90 days supply | Qty: 90 | Fill #1

## 2018-02-13 NOTE — Patient Instructions (Signed)
BEFORE YOU LEAVE: -flu shot -follow up: 3-4 months; lets check labs at your next visit  Please work on a low sugar diet and regular exercise. Shoot for a 10 - 20 pound weight reduction over the next 3-6 months.   We recommend the following healthy lifestyle for LIFE: 1) Small portions. But, make sure to get regular (at least 3 per day), healthy meals and small healthy snacks if needed.  2) Eat a healthy clean diet.   TRY TO EAT: -at least 5-7 servings of low sugar, colorful, and nutrient rich vegetables per day (not corn, potatoes or bananas.) -berries are the best choice if you wish to eat fruit (only eat small amounts if trying to reduce weight)  -lean meets (fish, white meat of chicken or [place]) -vegan proteins for some meals - beans or tofu, whole grains, nuts and seeds -Replace bad fats with good fats - good fats include: fish, nuts and seeds, canola oil, olive oil -small amounts of low fat or non fat dairy -small amounts of100 % whole grains - check the lables -drink plenty of water  AVOID: -SUGAR, sweets, anything with added sugar, corn syrup or sweeteners - must read labels as even foods advertised as "healthy" often are loaded with sugar -if you must have a sweetener, small amounts of stevia may be best -sweetened beverages and artificially sweetened beverages -simple starches (rice, bread, potatoes, pasta, chips, etc - small amounts of 100% whole grains are ok) -red meat, pork, butter -fried foods, fast food, processed food, excessive dairy, eggs and coconut.  3)Get at least 150 minutes of sweaty aerobic exercise per week.  4)Reduce stress - consider counseling, meditation and relaxation to balance other aspects of your life.

## 2018-02-20 DIAGNOSIS — F331 Major depressive disorder, recurrent, moderate: Secondary | ICD-10-CM | POA: Diagnosis not present

## 2018-03-04 ENCOUNTER — Ambulatory Visit: Payer: Self-pay | Admitting: Family Medicine

## 2018-03-11 ENCOUNTER — Encounter: Payer: Self-pay | Admitting: Family Medicine

## 2018-03-11 ENCOUNTER — Ambulatory Visit (INDEPENDENT_AMBULATORY_CARE_PROVIDER_SITE_OTHER): Payer: 59 | Admitting: Certified Nurse Midwife

## 2018-03-11 ENCOUNTER — Encounter: Payer: Self-pay | Admitting: Certified Nurse Midwife

## 2018-03-11 ENCOUNTER — Other Ambulatory Visit: Payer: Self-pay

## 2018-03-11 VITALS — BP 118/80 | HR 70 | Resp 16 | Ht 68.75 in | Wt >= 6400 oz

## 2018-03-11 DIAGNOSIS — Z124 Encounter for screening for malignant neoplasm of cervix: Secondary | ICD-10-CM | POA: Diagnosis not present

## 2018-03-11 DIAGNOSIS — N912 Amenorrhea, unspecified: Secondary | ICD-10-CM

## 2018-03-11 DIAGNOSIS — H16403 Unspecified corneal neovascularization, bilateral: Secondary | ICD-10-CM | POA: Diagnosis not present

## 2018-03-11 DIAGNOSIS — Z3202 Encounter for pregnancy test, result negative: Secondary | ICD-10-CM | POA: Diagnosis not present

## 2018-03-11 DIAGNOSIS — Z01419 Encounter for gynecological examination (general) (routine) without abnormal findings: Secondary | ICD-10-CM

## 2018-03-11 DIAGNOSIS — Z3009 Encounter for other general counseling and advice on contraception: Secondary | ICD-10-CM

## 2018-03-11 LAB — POCT URINE PREGNANCY: Preg Test, Ur: NEGATIVE

## 2018-03-11 NOTE — Progress Notes (Signed)
32 y.o. G0P0000 Married  Caucasian Fe here for annual exam. Periods none since 01/20/18 which was light. No periods since Provera use on 01/20/18. Sexually active no contraception. Very frustrated she has gained more weight and feel her hormonal level has changed. Would like to be back on OCP, but not sure if it will help. Stress at work, but coping. Sees Dr. Maudie Mercury for aex, labs, Cymbalta management for anxiety, which is stable. No other health issues today.  Patient's last menstrual period was 01/20/2018 (exact date).          Sexually active: Yes.    The current method of family planning is none.    Exercising: No.  exercise Smoker:  no  Review of Systems  Constitutional: Negative.   HENT: Negative.   Eyes: Negative.   Respiratory: Negative.   Cardiovascular: Negative.   Gastrointestinal: Negative.   Genitourinary:       Menstrual cycle changes  Musculoskeletal: Negative.   Skin: Negative.   Neurological: Negative.   Endo/Heme/Allergies: Negative.   Psychiatric/Behavioral: Negative.     Health Maintenance: Pap:  02-18-14 neg, 02-28-16 neg HPV HR neg History of Abnormal Pap: no MMG:  03-06-16 bilateral & left breast u/s category b density birads 1:neg Self Breast exams: yes Colonoscopy:  none BMD:   none TDaP:  2017 Shingles: no Pneumonia: no Hep C and HIV: not done Labs: poct upr-neg   reports that she has never smoked. She has never used smokeless tobacco. She reports that she drinks alcohol. She reports that she does not use drugs.  Past Medical History:  Diagnosis Date  . Anxiety   . Asthma   . Hyperglycemia   . Hyperlipemia   . Morbid obesity ([place])    sees weight loss clinic  . Nevus    sees dermatologist, Dr. Tonia Brooms  . Obstructive sleep apnea on CPAP     Past Surgical History:  Procedure Laterality Date  . finger laceration Right 2008   2nd finger  . implanon insertion  06-26-11   left upper arm  . MOLE REMOVAL    . OTHER SURGICAL HISTORY Left [id number]    Implanon  . WISDOM TOOTH EXTRACTION      Current Outpatient Medications  Medication Sig Dispense Refill  . DULoxetine (CYMBALTA) 60 MG capsule Take 1 capsule (60 mg total) by mouth daily. 90 capsule 1  . ibuprofen (ADVIL,MOTRIN) 200 MG tablet Take 3 tablets (600 mg total) by mouth every 6 (six) hours as needed for moderate pain. 30 tablet 0  . omeprazole (PRILOSEC) 20 MG capsule Take 20 mg by mouth daily.     No current facility-administered medications for this visit.     Family History  Problem Relation Age of Onset  . Atrial fibrillation Mother   . Diabetes Father   . Cancer Father        skin  . Polycystic ovary syndrome Sister   . Depression Sister   . Anxiety disorder Sister   . Bipolar disorder Sister   . Cancer Maternal Grandfather        skin  . Dementia Maternal Grandmother     ROS:  Pertinent items are noted in HPI.  Otherwise, a comprehensive ROS was negative.  Exam:   BP 130/90   Pulse 70   Resp 16   Ht 5' 8.75" (1.746 m)   Wt (!) 402 lb (182.3 kg)   LMP 01/20/2018 (Exact Date)   BMI 59.80 kg/m  Height: 5' 8.75" (174.6 cm) Ht  Readings from Last 3 Encounters:  03/11/18 5' 8.75" (1.746 m)  12/25/17 5\' 8"  (1.727 m)  12/02/17 5\' 8"  (1.727 m)    General appearance: alert, cooperative and appears stated age Head: Normocephalic, without obvious abnormality, atraumatic Neck: no adenopathy, supple, symmetrical, trachea midline and thyroid normal to inspection and palpation Lungs: clear to auscultation bilaterally Breasts: normal appearance, no masses or tenderness, No nipple retraction or dimpling, No nipple discharge or bleeding, No axillary or supraclavicular adenopathy Heart: regular rate and rhythm Abdomen: soft, non-tender; no masses,  no organomegaly Extremities: extremities normal, atraumatic, no cyanosis or edema Skin: Skin color, texture, turgor normal. No rashes or lesions Lymph nodes: Cervical, supraclavicular, and axillary nodes normal. No  abnormal inguinal nodes palpated Neurologic: Grossly normal   Pelvic: External genitalia:  no lesions              Urethra:  normal appearing urethra with no masses, tenderness or lesions              Bartholin's and Skene's: normal                 Vagina: normal appearing vagina with normal color and discharge, no lesions              Cervix: no cervical motion tenderness, no lesions and nulliparous appearance              Pap taken: Yes.   Bimanual Exam:  Uterus:  normal size, contour, position, consistency, mobility, non-tender, anteverted and difficult to palpate due to body habitus              Adnexa: normal adnexa, no mass, fullness, tenderness and limited by body habitus               Rectovaginal: Confirms               Anus:  normal sphincter tone, no lesions  Chaperone present: yes  A:  Well Woman with normal exam  Contraception previous OCP, stopped due to hormonal changes, now amenorrhea  Weight gain of 37 pounds in the past  Year, previous weight loss in 2017  Anxiety/depression on Cymbalta working well, PCP management    P:   Reviewed health and wellness pertinent to exam  Contraception previous OCP, discussed due to weight gain do not feel OCP is good choice increase risk of DVT, discussed POP and Mirena IUD. Feel IUD would be best choice to no hormonal issues except acts on uterine lining with good cycle control and would prevent  Concerns with hyperplasia if she missed period. Risks/benefits/expectations and insertion/removal reviewed. Aware would be MD insertion due to nulliparous. Questions addressed and patient would like to proceed with IUD. Will need to use consistent condoms and have serum HCG in 2 weeks, no unprotected sexual activity. Patient agreeable. Plan to do HCG and insert next day if negative. Given printed information and insurance information sheet. Patient will be called with insurance information and will plan for insertion.  Discussed weight control  options of weight watchers, Bariatric clinic / surgery. Patient aware and may consider.  Continue follow up with PCP as indicated.  Pap smear: yes  counseled on breast self exam, family planning choices, adequate intake of calcium and vitamin D, diet and exercise  return annually or prn  An After Visit Summary was printed and given to the patient.

## 2018-03-12 ENCOUNTER — Telehealth: Payer: Self-pay | Admitting: Certified Nurse Midwife

## 2018-03-12 NOTE — Telephone Encounter (Signed)
Call placed to convey benefits. 

## 2018-03-13 ENCOUNTER — Telehealth: Payer: Self-pay | Admitting: Emergency Medicine

## 2018-03-13 DIAGNOSIS — F331 Major depressive disorder, recurrent, moderate: Secondary | ICD-10-CM | POA: Diagnosis not present

## 2018-03-13 DIAGNOSIS — N912 Amenorrhea, unspecified: Secondary | ICD-10-CM

## 2018-03-13 DIAGNOSIS — Z30430 Encounter for insertion of intrauterine contraceptive device: Secondary | ICD-10-CM

## 2018-03-13 LAB — CYTOLOGY - PAP
Diagnosis: NEGATIVE
HPV (WINDOPATH): NOT DETECTED

## 2018-03-13 MED FILL — LOTEMAX 0.5% GEL: 0.5 | 7 days supply | Qty: 5 | Fill #0

## 2018-03-13 NOTE — Telephone Encounter (Signed)
Spoke with patient to coordinate Mirena IUD insertion with Dr. Talbert Nan per Melvia Heaps CNM.   Patient will come for stat hcg qual at 0830 on 11/18 and Mirena IUD insertion at 1600 at 03/24/18 with Dr. Talbert Nan.   Motrin 800 mg po x 1 with food one hour before appointment.  Pt agreeable to plan and verbalizes understanding of instructions.   Encounter to Dr. Talbert Nan and Melvia Heaps CNM.

## 2018-03-14 ENCOUNTER — Encounter: Payer: 59 | Attending: Family Medicine | Admitting: Registered"

## 2018-03-14 ENCOUNTER — Encounter: Payer: Self-pay | Admitting: Registered"

## 2018-03-14 DIAGNOSIS — Z713 Dietary counseling and surveillance: Secondary | ICD-10-CM | POA: Insufficient documentation

## 2018-03-14 NOTE — Progress Notes (Signed)
Medical Nutrition Therapy:  Appt start time: 0930 end time:  1015.  [employer] Employee spouse  Assessment:  Primary concerns today: Pt states she would like help with weight loss before she considers gastric bypass surgery. Pt states she has not been to a dietitian before and feels she already knows what to do, but just doesn't know how she can stick to it. Pt states she doesn't like routine and states she is familiar with the "rebel" category in the [place] quiz which she did in the [place] non-hunger eating class. Pt states she is using app to count calories, her goal is 1800 cal. Pt states she likes weight watchers but feels it is not worth the cost, tried keto with her husband but felt it was not healthy, also lost weight with HCG but also not sustainable.  Pt states the two times she has trouble with food decisions are breakfast - likes coffee with a lot of extra stuff in it  - and in the evening, states she is physically and mentally tired, wants to just sit on couch and snack. Pt states she doesn't want to feel her emotions. RD encouraged her to work on this in her regularly scheduled psychologist counseling sessions.  Pt states she stopped taking birth control due to concern about long term use of hormones. Pt states she had some trouble with her period (missed, heavy), states she is planning to get IUD couple of weeks. Pt states she would like to conceive but is waiting until she loses weight for safety concerns.   Pt states she gains weight easily, last 6 months gain 20-30 lbs, has become less active since buying home and getting puppy has taken up more of her time. Pt states she has had her thyroid checked as well as an ultrasound looking for PCOS, but no cysts found. Pt states her sister has PCOS.   Pt states she has struggled with her weight most of her life. Pt states she first noticed weight gain when she was 32 yrs old when she started having more access to forbidden foods.  Pt states her mother has always dieted and concerned about weight and everyone in the house had food restrictions.   End of day no energy sit on the couch and eat while on computer or TV Wants to just sit down, a lot of effort to move and be active -  job change was good maybe more stressful. Shorter commute, less structured schedule flexible, travels weekly.   Sleep: 6.5-7.5/hrs per night. Wakes rested, but after she takes care of dogs wants to just sit down, has to keep pushing herself to keep up the momentum. Goes to bed sometime between 10 and midnight. Stress: 5/10 acceptable  Related labs, problem list: LDL 135 (last year); 12/02/17 A1c 6.0%; OSA (uses CPAP),  GERD - daily omeprazole, Iron deficiency  Preferred Learning Style:   No preference indicated   Learning Readiness:   Ready  MEDICATIONS: reviewed   DIETARY INTAKE:  Usual eating pattern includes 3 meals and 2 snacks per day. Likes cooked vegetables, not big on fruit.  Everyday foods include always drinks coffee in the morning.    24-hr recall:  B ( AM): eggs OR breakfast sandwich [place] [employer] 2x week OR [employer]bucks 3x week eats in car on way to work. Snk ( AM): none  L ( PM): poki bowl rice bowl with crab & veggies OR leftovers OR frozen meals Snk ( PM): crackers, chips  D ( PM): (avoided pizza at bible study by bringing her own food) shrimp, apples, cheese Snk ( PM): cereal  Beverages: coffee (sweet), water  Usual physical activity: ADLs  Estimated energy needs: 1800 calories  Progress Towards Goal(s):  New goals.   Nutritional Diagnosis:  NI-5.8.2 Excessive carbohydrate intake As related to nighttime snacking.  As evidenced by dietary recall, pt states problem food.    Intervention:  Nutrition Education. Education. Discussed balanced eating. Discussed importance of understanding personal motivation. Discussed appropriate way to use food rules until building trust/relationship with body & food. Discussed  importance and alternatives to movement.  Teaching Method Utilized:  Visual Auditory  Handouts given during visit include:  MyPlate Planner  Barriers to learning/adherence to lifestyle change: none  Demonstrated degree of understanding via:  Teach Back   Monitoring/Evaluation:  Dietary intake, exercise, and body weight prn.

## 2018-03-14 NOTE — Patient Instructions (Addendum)
Consider having your Vitamin D tested Consider reviewing what you learned in the non-hunger eating class [email or web address] Consider having the 12 hr eating window for 2 weeks while working on understanding what is driving your nighttime eating. Exercise will help your body use insulin better. Consider looking into personal trainer at [employer].

## 2018-03-24 ENCOUNTER — Encounter: Payer: Self-pay | Admitting: Obstetrics and Gynecology

## 2018-03-24 ENCOUNTER — Other Ambulatory Visit: Payer: Self-pay

## 2018-03-24 ENCOUNTER — Other Ambulatory Visit: Payer: Self-pay | Admitting: Certified Nurse Midwife

## 2018-03-24 ENCOUNTER — Ambulatory Visit (INDEPENDENT_AMBULATORY_CARE_PROVIDER_SITE_OTHER): Payer: 59 | Admitting: Obstetrics and Gynecology

## 2018-03-24 ENCOUNTER — Other Ambulatory Visit (INDEPENDENT_AMBULATORY_CARE_PROVIDER_SITE_OTHER): Payer: 59

## 2018-03-24 VITALS — BP 140/84 | HR 96 | Wt >= 6400 oz

## 2018-03-24 DIAGNOSIS — Z30430 Encounter for insertion of intrauterine contraceptive device: Secondary | ICD-10-CM

## 2018-03-24 DIAGNOSIS — Z3009 Encounter for other general counseling and advice on contraception: Secondary | ICD-10-CM

## 2018-03-24 DIAGNOSIS — N912 Amenorrhea, unspecified: Secondary | ICD-10-CM

## 2018-03-24 LAB — HCG, SERUM, QUALITATIVE: hCG,Beta Subunit,Qual,Serum: NEGATIVE m[IU]/mL (ref <6)

## 2018-03-24 NOTE — Progress Notes (Signed)
GYNECOLOGY  VISIT   HPI: 32 y.o.   Married White or Caucasian Not Hispanic or Latino  female   G0P0000 with Patient's last menstrual period was 01/21/2018 (exact date).   here for Mirena IUD insertion. She needs contraception and endometrial protection.   GYNECOLOGIC HISTORY: Patient's last menstrual period was 01/21/2018 (exact date). Contraception: None, HCG today neg see EPIC Menopausal hormone therapy: None        OB History    Gravida  0   Para  0   Term  0   Preterm  0   AB  0   Living  0     SAB  0   TAB  0   Ectopic  0   Multiple  0   Live Births                 Patient Active Problem List   Diagnosis Date Noted  . Nutritional counseling 03/14/2018  . OSA (obstructive sleep apnea) 03/22/2016  . Low serum iron 12/01/2015  . Hyperglycemia 01/26/2014  . Morbid obesity ([place]) 01/26/2014  . GERD (gastroesophageal reflux disease) 01/26/2014  . Generalized anxiety disorder 01/26/2014    Past Medical History:  Diagnosis Date  . Anxiety   . Asthma   . Hyperglycemia   . Hyperlipemia   . Morbid obesity ([place])    sees weight loss clinic  . Nevus    sees dermatologist, Dr. Tonia Brooms  . Obstructive sleep apnea on CPAP     Past Surgical History:  Procedure Laterality Date  . finger laceration Right 2008   2nd finger  . implanon insertion  06-26-11   left upper arm  . MOLE REMOVAL    . OTHER SURGICAL HISTORY Left [id number]    Implanon  . WISDOM TOOTH EXTRACTION      Current Outpatient Medications  Medication Sig Dispense Refill  . DULoxetine (CYMBALTA) 60 MG capsule Take 1 capsule (60 mg total) by mouth daily. 90 capsule 1  . ibuprofen (ADVIL,MOTRIN) 200 MG tablet Take 3 tablets (600 mg total) by mouth every 6 (six) hours as needed for moderate pain. 30 tablet 0  . IRON PO Take by mouth.    . Multiple Vitamins-Minerals (MULTIVITAMIN ADULT PO) Take by mouth.    Marland Kitchen omeprazole (PRILOSEC) 20 MG capsule Take 20 mg by mouth daily.     No current  facility-administered medications for this visit.      ALLERGIES: Patient has no known allergies.  Family History  Problem Relation Age of Onset  . Atrial fibrillation Mother   . Diabetes Father   . Cancer Father        skin  . Polycystic ovary syndrome Sister   . Depression Sister   . Anxiety disorder Sister   . Bipolar disorder Sister   . Cancer Maternal Grandfather        skin  . Dementia Maternal Grandmother     Social History   Socioeconomic History  . Marital status: Married    Spouse name: Mortimer Fries  . Number of children: 0  . Years of education: 35  . Highest education level: Not on file  Occupational History  . Occupation: LEGAL ASSISTANT   . Occupation: Conservator, museum/gallery: [employer]  Social Needs  . Financial resource strain: Not on file  . Food insecurity:    Worry: Not on file    Inability: Not on file  . Transportation needs:    Medical: Not on file  Non-medical: Not on file  Tobacco Use  . Smoking status: Never Smoker  . Smokeless tobacco: Never Used  Substance and Sexual Activity  . Alcohol use: Yes    Comment: few a month  . Drug use: No  . Sexual activity: Yes    Partners: Male    Birth control/protection: None  Lifestyle  . Physical activity:    Days per week: Not on file    Minutes per session: Not on file  . Stress: Not on file  Relationships  . Social connections:    Talks on phone: Not on file    Gets together: Not on file    Attends religious service: Not on file    Active member of club or organization: Not on file    Attends meetings of clubs or organizations: Not on file    Relationship status: Not on file  . Intimate partner violence:    Fear of current or ex partner: Not on file    Emotionally abused: Not on file    Physically abused: Not on file    Forced sexual activity: Not on file  Other Topics Concern  . Not on file  Social History Narrative      Marital Status: Married Chief Operating Officer)    Children:   None    Pets:  5 dogs   Living Situation: Lives with his husband.   Occupation: Civil Service fast streamer     Education: Forensic psychologist ([employer]) [employer])    Alcohol Use:  Occasional   Tobacco Use:  None    Drug Use:  None   Diet:  Regular   Exercise:  None   Hobbies:  Reading, Movies              Review of Systems  Constitutional: Negative.   HENT: Negative.   Eyes: Negative.   Respiratory: Negative.   Cardiovascular: Negative.   Gastrointestinal: Negative.   Genitourinary: Negative.   Musculoskeletal: Negative.   Skin: Negative.   Neurological: Negative.   Endo/Heme/Allergies: Negative.   Psychiatric/Behavioral: Negative.     PHYSICAL EXAMINATION:    BP 140/84 (BP Location: Right Arm, Patient Position: Sitting, Cuff Size: Large)   Pulse 96   Wt (!) 402 lb 12.8 oz (182.7 kg)   LMP 01/21/2018 (Exact Date)   BMI 59.92 kg/m     General appearance: alert, cooperative and appears stated age  Pelvic: External genitalia:  no lesions              Urethra:  normal appearing urethra with no masses, tenderness or lesions              Bartholins and Skenes: normal                 Vagina: normal appearing vagina with normal color and discharge, no lesions              Cervix: no lesions               The risks of the mirena IUD were reviewed with the patient, including infection, abnormal bleeding and uterine perfortion. Consent was signed.  A speculum was placed in the vagina, the cervix was cleansed with betadine. A tenaculum was placed on the cervix, the uterus sounded to 7-8 cm. The cervix was dilated to a 5 hagar dilator  The mirena IUD was inserted without difficulty. The string were cut to 3-4 cm. The tenaculum was removed. Slight oozing from the tenaculum  site was stopped with pressure.   The patient tolerated the procedure well.    Chaperone was present for exam.  ASSESSMENT Contraception, discussed risks and benefits of the mirena IUD     PLAN Mirena IUD placed F/U in one month   An After Visit Summary was printed and given to the patient.  ~10 minutes face to face time of which over 50% was spent in counseling.   CC: Evalee Mutton, CNM

## 2018-03-24 NOTE — Patient Instructions (Signed)
IUD Post-procedure Instructions Cramping is common.  You may take Ibuprofen, Aleve, or Tylenol for the cramping.  This should resolve within 24 hours.   You may have a small amount of spotting.  You should wear a mini pad for the next few days. You may have intercourse in 24 hours. You need to call the office if you have any pelvic pain, fever, heavy bleeding, or foul smelling vaginal discharge. Shower or bathe as normal Use back up contraception for one week 

## 2018-03-25 ENCOUNTER — Ambulatory Visit: Payer: Self-pay | Admitting: Internal Medicine

## 2018-03-28 ENCOUNTER — Other Ambulatory Visit: Payer: 59

## 2018-03-28 DIAGNOSIS — H16403 Unspecified corneal neovascularization, bilateral: Secondary | ICD-10-CM | POA: Diagnosis not present

## 2018-03-28 DIAGNOSIS — H04123 Dry eye syndrome of bilateral lacrimal glands: Secondary | ICD-10-CM | POA: Diagnosis not present

## 2018-03-30 NOTE — Progress Notes (Signed)
@Patient  ID: Mary Brandt, female    DOB: 1985/09/11, 32 y.o.   MRN: [id number]  Chief Complaint  Patient presents with  . Follow-up    OSA >> Using CPAP every night with no issues. DME is [employer].    Referring provider: Lucretia Kern, DO  HPI:  32 year old female never smoker followed in our office for obstructive sleep apnea (managed on CPAP)  PMH: Morbid obesity, asthma, anxiety, GERD Smoker/ Smoking History: Never smoker Maintenance: None Pt of: Dr. Annamaria Boots  Recent [place] [facility] Encounters:   Last seen in 2018 with good compliance  03/31/2018  - Visit   32 year old female never smoker reporting to our office for one-year follow-up for CPAP use.  Patient reports no issues with using CPAP and reports she cannot sleep without it.  CPAP compliance report showing 30 out of last 30 days use, all 30 days above 4 hours, average usage is 7 hours and 28 minutes, APAP settings 5-15, 95th percentile 13.3, AHI 1.    Tests:  HST-12/28/15-AHI 13.1/hour, desaturation to 82%, body weight 358 pounds  FENO:  No results found for: NITRICOXIDE  PFT: No flowsheet data found.  Imaging: No results found.  Chart Review:    Specialty Problems      Pulmonary Problems   OSA (obstructive sleep apnea)    HST  AHI 13 (12/2015). Autocpap 5-15 cm water.          No Known Allergies  Immunization History  Administered Date(s) Administered  . HPV Quadrivalent 12/04/2007, 02/03/2008, 01/23/2010  . Influenza,inj,Quad PF,6+ Mos 05/04/2015, 05/10/2016, 02/14/2017, 02/13/2018  . Tdap 05/07/2006, 02/28/2016    Past Medical History:  Diagnosis Date  . Anxiety   . Asthma   . Hyperglycemia   . Hyperlipemia   . Morbid obesity ([place])    sees weight loss clinic  . Nevus    sees dermatologist, Dr. Tonia Brooms  . Obstructive sleep apnea on CPAP     Tobacco History: Social History   Tobacco Use  Smoking Status Never Smoker  Smokeless Tobacco Never Used   Counseling given:  Yes   Outpatient Encounter Medications as of 03/31/2018  Medication Sig  . DULoxetine (CYMBALTA) 60 MG capsule Take 1 capsule (60 mg total) by mouth daily.  Marland Kitchen ibuprofen (ADVIL,MOTRIN) 200 MG tablet Take 3 tablets (600 mg total) by mouth every 6 (six) hours as needed for moderate pain.  . IRON PO Take by mouth.  . levonorgestrel (MIRENA) 20 MCG/24HR IUD 1 each by Intrauterine route once.  . Multiple Vitamins-Minerals (MULTIVITAMIN ADULT PO) Take by mouth.  Marland Kitchen omeprazole (PRILOSEC) 20 MG capsule Take 20 mg by mouth daily.   No facility-administered encounter medications on file as of 03/31/2018.      Review of Systems  Review of Systems  Constitutional: Negative for chills and fatigue.  HENT: Negative for congestion.   Respiratory: Negative for cough, chest tightness, shortness of breath and wheezing.   Cardiovascular: Negative for chest pain and palpitations.  Musculoskeletal: Negative for arthralgias.  Allergic/Immunologic: Negative for environmental allergies and food allergies.  Neurological: Negative for dizziness, light-headedness and headaches.  Psychiatric/Behavioral: Negative for dysphoric mood. The patient is not nervous/anxious.   All other systems reviewed and are negative.    Physical Exam  BP 104/80 (BP Location: Left Arm, Cuff Size: Large)   Pulse 82   Ht 5\' 8"  (1.727 m)   Wt (!) 404 lb (183.3 kg)   SpO2 100%   BMI 61.43 kg/m   Wt Readings  from Last 5 Encounters:  03/31/18 (!) 404 lb (183.3 kg)  03/24/18 (!) 402 lb 12.8 oz (182.7 kg)  03/11/18 (!) 402 lb (182.3 kg)  02/13/18 (!) 403 lb (182.8 kg)  12/25/17 (!) 396 lb (179.6 kg)     Physical Exam  Constitutional: She is oriented to person, place, and time and well-developed, well-nourished, and in no distress. No distress.  HENT:  Head: Normocephalic and atraumatic.  Right Ear: Hearing and external ear normal.  Left Ear: Hearing and external ear normal.  Nose: Nose normal. Right sinus exhibits no  maxillary sinus tenderness and no frontal sinus tenderness. Left sinus exhibits no maxillary sinus tenderness and no frontal sinus tenderness.  Mouth/Throat: Uvula is midline.  Eyes: Pupils are equal, round, and reactive to light.  Neck: Normal range of motion. Neck supple.  Cardiovascular: Normal rate, regular rhythm and normal heart sounds.  Pulmonary/Chest: Effort normal and breath sounds normal. No accessory muscle usage. No respiratory distress. She has no decreased breath sounds. She has no wheezes. She has no rhonchi.  Musculoskeletal: Normal range of motion.  Neurological: She is alert and oriented to person, place, and time. Gait normal.  Skin: Skin is warm and dry. She is not diaphoretic. No erythema.  Psychiatric: Mood, memory, affect and judgment normal.  Nursing note and vitals reviewed.     Lab Results:  CBC    Component Value Date/Time   WBC 8.3 06/03/2017 0840   RBC 4.78 06/03/2017 0840   HGB 13.7 06/03/2017 0840   HCT 42.0 06/03/2017 0840   PLT 356.0 06/03/2017 0840   MCV 88.0 06/03/2017 0840   MCH 27.1 10/05/2013 0815   MCHC 32.5 06/03/2017 0840   RDW 15.0 06/03/2017 0840   LYMPHSABS 2.4 10/05/2013 0815   MONOABS 0.6 10/05/2013 0815   EOSABS 0.2 10/05/2013 0815   BASOSABS 0.0 10/05/2013 0815    BMET    Component Value Date/Time   NA 141 10/08/2016 1030   K 3.8 10/08/2016 1030   CL 105 10/08/2016 1030   CO2 26 10/08/2016 1030   GLUCOSE 80 10/08/2016 1030   BUN 18 10/08/2016 1030   CREATININE 0.90 10/08/2016 1030   CREATININE 0.89 10/05/2013 0815   CALCIUM 9.2 10/08/2016 1030    BNP No results found for: BNP  ProBNP No results found for: PROBNP    Assessment & Plan:   Pleasant 32 year old female patient completing 1 year follow-up with our office today.  Patient is doing quite well on CPAP.  CPAP compliance report confirms this.  Patient has no current complaints or issues.  Patient can follow-up with our office in 1 year.  OSA (obstructive  sleep apnea) Follow up in 1 year   We recommend that you continue using your CPAP daily >>>Keep up the hard work using your device >>> Goal should be wearing this for the entire night that you are sleeping, at least 4 to 6 hours  Remember:  . Do not drive or operate heavy machinery if tired or drowsy.  . Please notify the supply company and office if you are unable to use your device regularly due to missing supplies or machine being broken.  . Work on maintaining a healthy weight and following your recommended nutrition plan  . Maintain proper daily exercise and movement  . Maintaining proper use of your device can also help improve management of other chronic illnesses such as: Blood pressure, blood sugars, and weight management.   BiPAP/ CPAP Cleaning:  >>>Clean weekly, with [facility]  soap, and bottle brush.  Set up to air dry.   Morbid obesity Continue to work on healthy weight loss     Lauraine Rinne, NP 03/31/2018

## 2018-03-31 ENCOUNTER — Encounter: Payer: Self-pay | Admitting: Pulmonary Disease

## 2018-03-31 ENCOUNTER — Ambulatory Visit: Payer: 59 | Admitting: Pulmonary Disease

## 2018-03-31 VITALS — BP 104/80 | HR 82 | Ht 68.0 in | Wt >= 6400 oz

## 2018-03-31 DIAGNOSIS — G4733 Obstructive sleep apnea (adult) (pediatric): Secondary | ICD-10-CM | POA: Diagnosis not present

## 2018-03-31 NOTE — Patient Instructions (Signed)
Follow up in 1 year   We recommend that you continue using your CPAP daily >>>Keep up the hard work using your device >>> Goal should be wearing this for the entire night that you are sleeping, at least 4 to 6 hours  Remember:  . Do not drive or operate heavy machinery if tired or drowsy.  . Please notify the supply company and office if you are unable to use your device regularly due to missing supplies or machine being broken.  . Work on maintaining a healthy weight and following your recommended nutrition plan  . Maintain proper daily exercise and movement  . Maintaining proper use of your device can also help improve management of other chronic illnesses such as: Blood pressure, blood sugars, and weight management.   BiPAP/ CPAP Cleaning:  >>>Clean weekly, with Dawn soap, and bottle brush.  Set up to air dry.    It is flu season:   >>>Remember to be washing your hands regularly, using hand sanitizer, be careful to use around herself with has contact with people who are sick will increase her chances of getting sick yourself. >>> Best ways to protect herself from the flu: Receive the yearly flu vaccine, practice good hand hygiene washing with soap and also using hand sanitizer when available, eat a nutritious meals, get adequate rest, hydrate appropriately   Please contact the office if your symptoms worsen or you have concerns that you are not improving.   Thank you for choosing [facility] for your healthcare, and for allowing [place] to partner with you on your healthcare journey. I am thankful to be able to provide care to you today.   Wyn Quaker FNP-C

## 2018-03-31 NOTE — Assessment & Plan Note (Signed)
Follow up in 1 year   We recommend that you continue using your CPAP daily >>>Keep up the hard work using your device >>> Goal should be wearing this for the entire night that you are sleeping, at least 4 to 6 hours  Remember:  . Do not drive or operate heavy machinery if tired or drowsy.  . Please notify the supply company and office if you are unable to use your device regularly due to missing supplies or machine being broken.  . Work on maintaining a healthy weight and following your recommended nutrition plan  . Maintain proper daily exercise and movement  . Maintaining proper use of your device can also help improve management of other chronic illnesses such as: Blood pressure, blood sugars, and weight management.   BiPAP/ CPAP Cleaning:  >>>Clean weekly, with Dawn soap, and bottle brush.  Set up to air dry.

## 2018-03-31 NOTE — Assessment & Plan Note (Signed)
Continue to work on healthy weight loss 

## 2018-04-17 DIAGNOSIS — G4733 Obstructive sleep apnea (adult) (pediatric): Secondary | ICD-10-CM | POA: Diagnosis not present

## 2018-04-23 ENCOUNTER — Telehealth: Payer: Self-pay | Admitting: Obstetrics and Gynecology

## 2018-04-23 ENCOUNTER — Ambulatory Visit: Payer: 59 | Admitting: Obstetrics and Gynecology

## 2018-04-23 NOTE — Telephone Encounter (Signed)
Patient called and cancelled her 1 month IUD recheck today with Dr. Talbert Nan due to starting her menstrual cycle. She rescheduled to next Thursday, 05/01/18, at 4:00pm. Routing to provider for [facility] and closing encounter.

## 2018-04-23 NOTE — Progress Notes (Deleted)
GYNECOLOGY  VISIT   HPI: 32 y.o.   Married White or Caucasian Not Hispanic or Latino  female   G0P0000 with No LMP recorded.   here for     GYNECOLOGIC HISTORY: No LMP recorded. Contraception:*** Menopausal hormone therapy: ***        OB History    Gravida  0   Para  0   Term  0   Preterm  0   AB  0   Living  0     SAB  0   TAB  0   Ectopic  0   Multiple  0   Live Births                 Patient Active Problem List   Diagnosis Date Noted  . Nutritional counseling 03/14/2018  . OSA (obstructive sleep apnea) 03/22/2016  . Low serum iron 12/01/2015  . Hyperglycemia 01/26/2014  . Morbid obesity ([place]) 01/26/2014  . GERD (gastroesophageal reflux disease) 01/26/2014  . Generalized anxiety disorder 01/26/2014    Past Medical History:  Diagnosis Date  . Anxiety   . Asthma   . Hyperglycemia   . Hyperlipemia   . Morbid obesity ([place])    sees weight loss clinic  . Nevus    sees dermatologist, Dr. Tonia Brooms  . Obstructive sleep apnea on CPAP     Past Surgical History:  Procedure Laterality Date  . finger laceration Right 2008   2nd finger  . implanon insertion  06-26-11   left upper arm  . MOLE REMOVAL    . OTHER SURGICAL HISTORY Left [id number]    Implanon  . WISDOM TOOTH EXTRACTION      Current Outpatient Medications  Medication Sig Dispense Refill  . DULoxetine (CYMBALTA) 60 MG capsule Take 1 capsule (60 mg total) by mouth daily. 90 capsule 1  . ibuprofen (ADVIL,MOTRIN) 200 MG tablet Take 3 tablets (600 mg total) by mouth every 6 (six) hours as needed for moderate pain. 30 tablet 0  . IRON PO Take by mouth.    . levonorgestrel (MIRENA) 20 MCG/24HR IUD 1 each by Intrauterine route once.    . Multiple Vitamins-Minerals (MULTIVITAMIN ADULT PO) Take by mouth.    Marland Kitchen omeprazole (PRILOSEC) 20 MG capsule Take 20 mg by mouth daily.     No current facility-administered medications for this visit.      ALLERGIES: Patient has no known allergies.  Family  History  Problem Relation Age of Onset  . Atrial fibrillation Mother   . Diabetes Father   . Cancer Father        skin  . Polycystic ovary syndrome Sister   . Depression Sister   . Anxiety disorder Sister   . Bipolar disorder Sister   . Cancer Maternal Grandfather        skin  . Dementia Maternal Grandmother     Social History   Socioeconomic History  . Marital status: Married    Spouse name: Mortimer Fries  . Number of children: 0  . Years of education: 79  . Highest education level: Not on file  Occupational History  . Occupation: LEGAL ASSISTANT   . Occupation: Conservator, museum/gallery: [employer]  Social Needs  . Financial resource strain: Not on file  . Food insecurity:    Worry: Not on file    Inability: Not on file  . Transportation needs:    Medical: Not on file    Non-medical: Not on file  Tobacco Use  . Smoking status: Never Smoker  . Smokeless tobacco: Never Used  Substance and Sexual Activity  . Alcohol use: Yes    Comment: few a month  . Drug use: No  . Sexual activity: Yes    Partners: Male    Birth control/protection: None  Lifestyle  . Physical activity:    Days per week: Not on file    Minutes per session: Not on file  . Stress: Not on file  Relationships  . Social connections:    Talks on phone: Not on file    Gets together: Not on file    Attends religious service: Not on file    Active member of club or organization: Not on file    Attends meetings of clubs or organizations: Not on file    Relationship status: Not on file  . Intimate partner violence:    Fear of current or ex partner: Not on file    Emotionally abused: Not on file    Physically abused: Not on file    Forced sexual activity: Not on file  Other Topics Concern  . Not on file  Social History Narrative      Marital Status: Married Chief Operating Officer)    Children:  None    Pets:  5 dogs   Living Situation: Lives with his husband.   Occupation: Civil Service fast streamer      Education: Forensic psychologist ([place]) [place])    Alcohol Use:  Occasional   Tobacco Use:  None    Drug Use:  None   Diet:  Regular   Exercise:  None   Hobbies:  Reading, Movies              ROS  PHYSICAL EXAMINATION:    There were no vitals taken for this visit.    General appearance: alert, cooperative and appears stated age Neck: no adenopathy, supple, symmetrical, trachea midline and thyroid {CHL AMB PHY EX THYROID NORM [phone]::"normal to inspection and palpation"} Breasts: {Exam; breast:[phone]::"normal appearance, no masses or tenderness"} Abdomen: soft, non-tender; non distended, no masses,  no organomegaly  Pelvic: External genitalia:  no lesions              Urethra:  normal appearing urethra with no masses, tenderness or lesions              Bartholins and Skenes: normal                 Vagina: normal appearing vagina with normal color and discharge, no lesions              Cervix: {CHL AMB PHY EX CERVIX NORM DEFAULT:[phone]::"no lesions"}              Bimanual Exam:  Uterus:  {CHL AMB PHY EX UTERUS NORM [phone]::"normal size, contour, position, consistency, mobility, non-tender"}              Adnexa: {CHL AMB PHY EX ADNEXA NO MASS DEFAULT:[phone]::"no mass, fullness, tenderness"}              Rectovaginal: {yes no:[phone]}.  Confirms.              Anus:  normal sphincter tone, no lesions  Chaperone was present for exam.  ASSESSMENT     PLAN    An After Visit Summary was printed and given to the patient.  *** minutes face to face time of which over 50% was spent in counseling.

## 2018-05-01 ENCOUNTER — Ambulatory Visit (INDEPENDENT_AMBULATORY_CARE_PROVIDER_SITE_OTHER): Payer: 59 | Admitting: Obstetrics and Gynecology

## 2018-05-01 ENCOUNTER — Encounter: Payer: Self-pay | Admitting: Obstetrics and Gynecology

## 2018-05-01 ENCOUNTER — Other Ambulatory Visit: Payer: Self-pay

## 2018-05-01 VITALS — BP 140/88 | HR 80 | Wt >= 6400 oz

## 2018-05-01 DIAGNOSIS — B372 Candidiasis of skin and nail: Secondary | ICD-10-CM

## 2018-05-01 DIAGNOSIS — Z30431 Encounter for routine checking of intrauterine contraceptive device: Secondary | ICD-10-CM | POA: Diagnosis not present

## 2018-05-01 MED ORDER — NYSTATIN 100000 UNIT/GM EX CREA: 1.0000 "application " | TOPICAL_CREAM | Freq: Two times a day (BID) | CUTANEOUS | 0 refills | Status: DC

## 2018-05-01 MED FILL — NYSTATIN 100,000 UNIT/GM CR: 100000 | 7 days supply | Qty: 30 | Fill #0

## 2018-05-01 NOTE — Progress Notes (Signed)
GYNECOLOGY  VISIT   HPI: 32 y.o.   Married White or Caucasian Not Hispanic or Latino  female G0P0000 with No LMP recorded.   here for 1 month mirena IUD check. She has been having light spotting for the last 1.5 weeks, no pain, no dyspareunia.    GYNECOLOGIC HISTORY: No LMP recorded. Patient is having ongoing spotting with IUD. Contraception: IUD Menopausal hormone therapy: None       OB History    Gravida  0   Para  0   Term  0   Preterm  0   AB  0   Living  0     SAB  0   TAB  0   Ectopic  0   Multiple  0   Live Births                 Patient Active Problem List   Diagnosis Date Noted  . Nutritional counseling 03/14/2018  . OSA (obstructive sleep apnea) 03/22/2016  . Low serum iron 12/01/2015  . Hyperglycemia 01/26/2014  . Morbid obesity ([place]) 01/26/2014  . GERD (gastroesophageal reflux disease) 01/26/2014  . Generalized anxiety disorder 01/26/2014    Past Medical History:  Diagnosis Date  . Anxiety   . Asthma   . Hyperglycemia   . Hyperlipemia   . Morbid obesity ([place])    sees weight loss clinic  . Nevus    sees dermatologist, Dr. Tonia Brooms  . Obstructive sleep apnea on CPAP     Past Surgical History:  Procedure Laterality Date  . finger laceration Right 2008   2nd finger  . implanon insertion  06-26-11   left upper arm  . MOLE REMOVAL    . OTHER SURGICAL HISTORY Left [id number]    Implanon  . WISDOM TOOTH EXTRACTION      Current Outpatient Medications  Medication Sig Dispense Refill  . DULoxetine (CYMBALTA) 60 MG capsule Take 1 capsule (60 mg total) by mouth daily. 90 capsule 1  . ibuprofen (ADVIL,MOTRIN) 200 MG tablet Take 3 tablets (600 mg total) by mouth every 6 (six) hours as needed for moderate pain. 30 tablet 0  . IRON PO Take by mouth.    . levonorgestrel (MIRENA) 20 MCG/24HR IUD 1 each by Intrauterine route once.    . Multiple Vitamins-Minerals (MULTIVITAMIN ADULT PO) Take by mouth.    Marland Kitchen omeprazole (PRILOSEC) 20 MG capsule Take 20  mg by mouth daily.     No current facility-administered medications for this visit.      ALLERGIES: Patient has no known allergies.  Family History  Problem Relation Age of Onset  . Atrial fibrillation Mother   . Diabetes Father   . Cancer Father        skin  . Polycystic ovary syndrome Sister   . Depression Sister   . Anxiety disorder Sister   . Bipolar disorder Sister   . Cancer Maternal Grandfather        skin  . Dementia Maternal Grandmother     Social History   Socioeconomic History  . Marital status: Married    Spouse name: Mortimer Fries  . Number of children: 0  . Years of education: 15  . Highest education level: Not on file  Occupational History  . Occupation: LEGAL ASSISTANT   . Occupation: Conservator, museum/gallery: [employer]  Social Needs  . Financial resource strain: Not on file  . Food insecurity:    Worry: Not on file  Inability: Not on file  . Transportation needs:    Medical: Not on file    Non-medical: Not on file  Tobacco Use  . Smoking status: Never Smoker  . Smokeless tobacco: Never Used  Substance and Sexual Activity  . Alcohol use: Yes    Comment: few a month  . Drug use: No  . Sexual activity: Yes    Partners: Male    Birth control/protection: I.U.D.  Lifestyle  . Physical activity:    Days per week: Not on file    Minutes per session: Not on file  . Stress: Not on file  Relationships  . Social connections:    Talks on phone: Not on file    Gets together: Not on file    Attends religious service: Not on file    Active member of club or organization: Not on file    Attends meetings of clubs or organizations: Not on file    Relationship status: Not on file  . Intimate partner violence:    Fear of current or ex partner: Not on file    Emotionally abused: Not on file    Physically abused: Not on file    Forced sexual activity: Not on file  Other Topics Concern  . Not on file  Social History Narrative      Marital Status:  Married Chief Operating Officer)    Children:  None    Pets:  5 dogs   Living Situation: Lives with his husband.   Occupation: Civil Service fast streamer     Education: Forensic psychologist ([place]) [place])    Alcohol Use:  Occasional   Tobacco Use:  None    Drug Use:  None   Diet:  Regular   Exercise:  None   Hobbies:  Reading, Movies              Review of Systems  Constitutional: Negative.   HENT: Negative.   Eyes: Negative.   Respiratory: Negative.   Cardiovascular: Negative.   Gastrointestinal: Negative.   Genitourinary: Negative.   Musculoskeletal: Negative.   Skin: Negative.   Neurological: Negative.   Endo/Heme/Allergies: Negative.   Psychiatric/Behavioral: Negative.     PHYSICAL EXAMINATION:    BP 140/88 (BP Location: Right Arm, Patient Position: Sitting, Cuff Size: Large)   Pulse 80   Wt (!) 402 lb (182.3 kg)   BMI 61.12 kg/m     General appearance: alert, cooperative and appears stated age Skin: bilateral groin with patchy, erythematous rash. R>L  Pelvic: External genitalia:  no lesions              Urethra:  normal appearing urethra with no masses, tenderness or lesions              Bartholins and Skenes: normal                 Vagina: normal appearing vagina with normal color and discharge, no lesions              Cervix: no lesions and IUD string 3 cm              Bimanual Exam:  Uterus:  no masses or tenderness              Adnexa: no mass, fullness, tenderness                Chaperone was present for exam.  ASSESSMENT IUD check, doing well Rash Bilateral groin, R>L. On questioning  the patient does c/o mild pruritus    PLAN Routine f/u for IUD Treat candida with nystatin cream   An After Visit Summary was printed and given to the patient.  CC: Evalee Mutton, CNM

## 2018-05-16 ENCOUNTER — Other Ambulatory Visit: Payer: Self-pay | Admitting: Family Medicine

## 2018-05-16 ENCOUNTER — Encounter: Payer: Self-pay | Admitting: Family Medicine

## 2018-05-16 MED ORDER — OMEPRAZOLE 20 MG PO CPDR: 20.0000 mg | DELAYED_RELEASE_CAPSULE | Freq: Every day | ORAL | 1 refills | Status: DC

## 2018-05-16 NOTE — Progress Notes (Deleted)
HPI:  Using dictation device. Unfortunately this device frequently misinterprets words/phrases.  Mary Brandt is a pleasant 33 y.o. here for follow up. Chronic medical problems summarized below were reviewed for changes and stability and were updated as needed below. These issues and their treatment remain stable for the most part. ***. Denies CP, SOB, DOE, treatment intolerance or new symptoms. Due for labs for hgba1c  Anxiety: -medications: cymbalta -Reports mood is good  Hyperglycemia/Morbid obesity/Hyperlip: -lifestyle recs advised - weight watchers helps, trying to walk her dog on a regular basis -wt 374 (1/18) -->373 (1/19) -->392 (7/19) --> 403 10/19 -->  GERD: -meds: PPI -Bad heartburn if she stops medicine  OSA: -seeing pulmonology   ROS: See pertinent positives and negatives per HPI.  Past Medical History:  Diagnosis Date  . Anxiety   . Asthma   . Hyperglycemia   . Hyperlipemia   . Morbid obesity ([place])    sees weight loss clinic  . Nevus    sees dermatologist, Dr. Tonia Brooms  . Obstructive sleep apnea on CPAP     Past Surgical History:  Procedure Laterality Date  . finger laceration Right 2008   2nd finger  . implanon insertion  06-26-11   left upper arm  . MOLE REMOVAL    . OTHER SURGICAL HISTORY Left [id number]    Implanon  . WISDOM TOOTH EXTRACTION      Family History  Problem Relation Age of Onset  . Atrial fibrillation Mother   . Diabetes Father   . Cancer Father        skin  . Polycystic ovary syndrome Sister   . Depression Sister   . Anxiety disorder Sister   . Bipolar disorder Sister   . Cancer Maternal Grandfather        skin  . Dementia Maternal Grandmother     SOCIAL HX: ***   Current Outpatient Medications:  .  DULoxetine (CYMBALTA) 60 MG capsule, Take 1 capsule (60 mg total) by mouth daily., Disp: 90 capsule, Rfl: 1 .  ibuprofen (ADVIL,MOTRIN) 200 MG tablet, Take 3 tablets (600 mg total) by mouth every 6 (six) hours  as needed for moderate pain., Disp: 30 tablet, Rfl: 0 .  IRON PO, Take by mouth., Disp: , Rfl:  .  levonorgestrel (MIRENA) 20 MCG/24HR IUD, 1 each by Intrauterine route once., Disp: , Rfl:  .  Multiple Vitamins-Minerals (MULTIVITAMIN ADULT PO), Take by mouth., Disp: , Rfl:  .  nystatin cream (MYCOSTATIN), Apply 1 application topically 2 (two) times daily. Apply to affected area BID for up to 7 days., Disp: 30 g, Rfl: 0 .  omeprazole (PRILOSEC) 20 MG capsule, Take 1 capsule (20 mg total) by mouth daily., Disp: 90 capsule, Rfl: 1  EXAM:  There were no vitals filed for this visit.  There is no height or weight on file to calculate BMI.  GENERAL: vitals reviewed and listed above, alert, oriented, appears well hydrated and in no acute distress  HEENT: atraumatic, conjunttiva clear, no obvious abnormalities on inspection of external nose and ears  NECK: no obvious masses on inspection  LUNGS: clear to auscultation bilaterally, no wheezes, rales or rhonchi, good air movement  CV: HRRR, no peripheral edema  MS: moves all extremities without noticeable abnormality *** PSYCH: pleasant and cooperative, no obvious depression or anxiety  ASSESSMENT AND PLAN:  Discussed the following assessment and plan:  No diagnosis found.  *** -Patient advised to return or notify a doctor immediately if symptoms worsen or persist or  new concerns arise.  There are no Patient Instructions on file for this visit.  Lucretia Kern, DO

## 2018-05-19 ENCOUNTER — Ambulatory Visit: Payer: Self-pay | Admitting: Family Medicine

## 2018-05-21 NOTE — Progress Notes (Signed)
HPI:  Using dictation device. Unfortunately this device frequently misinterprets words/phrases.   Mary Brandt is a pleasant 32 y.o. here for follow up. Chronic medical problems summarized below were reviewed for changes and stability and were updated as needed below. These issues and their treatment remain stable for the most part.  Deals with some forearm and hand pain on and off. Worse with picking things up, forearm flexor use and sleeping. She had a wrist brace for one wrist at one point. No numbness, weakness.  Due for labs   Anxiety:  -medications: cymbalta  -Reports mood is stable  Hyperglycemia/Morbid obesity/Hyperlip:  -she feels is a stress eater -she is [facility] -likes to cook -trying to walk her dog on a regular basis  -wt 374 (1/18) --> 373 (1/19) --> 392 (7/19) --> 403 10/19 -->  GERD:  -meds: PPI  -Bad heartburn if she stops medicine   OSA:  -seeing pulmonology   ROS: See pertinent positives and negatives per HPI.  Past Medical History:  Diagnosis Date  . Anxiety   . Asthma   . Hyperglycemia   . Hyperlipemia   . Morbid obesity ([place])    sees weight loss clinic  . Nevus    sees dermatologist, Dr. Tonia Brooms  . Obstructive sleep apnea on CPAP     Past Surgical History:  Procedure Laterality Date  . finger laceration Right 2008   2nd finger  . implanon insertion  06-26-11   left upper arm  . MOLE REMOVAL    . OTHER SURGICAL HISTORY Left [id number]    Implanon  . WISDOM TOOTH EXTRACTION      Family History  Problem Relation Age of Onset  . Atrial fibrillation Mother   . Diabetes Father   . Cancer Father        skin  . Polycystic ovary syndrome Sister   . Depression Sister   . Anxiety disorder Sister   . Bipolar disorder Sister   . Cancer Maternal Grandfather        skin  . Dementia Maternal Grandmother     SOCIAL HX: see hpi   Current Outpatient Medications:  .  DULoxetine (CYMBALTA) 60 MG capsule, Take  1 capsule (60 mg total) by mouth daily., Disp: 90 capsule, Rfl: 1 .  ibuprofen (ADVIL,MOTRIN) 200 MG tablet, Take 3 tablets (600 mg total) by mouth every 6 (six) hours as needed for moderate pain., Disp: 30 tablet, Rfl: 0 .  IRON PO, Take by mouth., Disp: , Rfl:  .  levonorgestrel (MIRENA) 20 MCG/24HR IUD, 1 each by Intrauterine route once., Disp: , Rfl:  .  Multiple Vitamins-Minerals (MULTIVITAMIN ADULT PO), Take by mouth., Disp: , Rfl:  .  nystatin cream (MYCOSTATIN), Apply 1 application topically 2 (two) times daily. Apply to affected area BID for up to 7 days., Disp: 30 g, Rfl: 0 .  omeprazole (PRILOSEC) 20 MG capsule, Take 1 capsule (20 mg total) by mouth daily., Disp: 90 capsule, Rfl: 1  EXAM:  Vitals:   05/22/18 1601  BP: 112/78  Pulse: 90  Temp: 98.2 F (36.8 C)  SpO2: 97%    Body mass index is 62.14 kg/m.  GENERAL: vitals reviewed and listed above, alert, oriented, appears well hydrated and in no acute distress  HEENT: atraumatic, conjunttiva clear, no obvious abnormalities on inspection of external nose and ears  NECK: no obvious masses on inspection  LUNGS: clear to auscultation bilaterally, no wheezes, rales or rhonchi, good air movement  CV: HRRR, no peripheral edema  MS: moves all extremities without noticeable abnormality, normal sensitivity to touch, strength throughout in upper ext and hands bilat, neg tinels and phalens, nv intact distal  PSYCH: pleasant and cooperative, no obvious depression or anxiety  ASSESSMENT AND PLAN:  Discussed the following assessment and plan:  Hyperglycemia - Plan: Hemoglobin A1c  Morbid obesity (HCC)  Generalized anxiety disorder  Hyperlipidemia, unspecified hyperlipidemia type  Gastroesophageal reflux disease without esophagitis  OSA (obstructive sleep apnea)  -trial wrist splints at night and modification posture with typing, etc for likely mild CTS; ortho referral if not improving advised -lifestyle recs and  options for wt reduction discussed at length -check hgba1c -follow up 3-4 months  Patient Instructions  BEFORE YOU LEAVE: -lab for diabetes lab check -follow up: 3 months  Try the wrist braces at night. Wear loosely. Would recommend orthopedic evaluation if this worsens or is not improving.   We recommend the following healthy lifestyle for LIFE: 1) Small portions. But, make sure to get regular (at least 3 per day), healthy meals and small healthy snacks if needed.  2) Eat a healthy clean diet.   TRY TO EAT: -at least 5-7 servings of low sugar, colorful, and nutrient rich vegetables per day (not corn, potatoes or bananas.) -berries are the best choice if you wish to eat fruit (only eat small amounts if trying to reduce weight)  -lean meets (fish, white meat of chicken or [place]) -vegan proteins for some meals - beans or tofu, whole grains, nuts and seeds -Replace bad fats with good fats - good fats include: fish, nuts and seeds, canola oil, olive oil -small amounts of low fat or non fat dairy -small amounts of100 % whole grains - check the lables -drink plenty of water  AVOID: -SUGAR, sweets, anything with added sugar, corn syrup or sweeteners - must read labels as even foods advertised as "healthy" often are loaded with sugar -if you must have a sweetener, small amounts of stevia may be best -sweetened beverages and artificially sweetened beverages -simple starches (rice, bread, potatoes, pasta, chips, etc - small amounts of 100% whole grains are ok) -red meat, pork, butter -fried foods, fast food, processed food, excessive dairy, eggs and coconut.  3)Get at least 150 minutes of sweaty aerobic exercise per week.  4)Reduce stress - consider counseling, meditation and relaxation to balance other aspects of your life.     Lucretia Kern, DO

## 2018-05-22 ENCOUNTER — Ambulatory Visit: Payer: Self-pay | Admitting: Family Medicine

## 2018-05-22 VITALS — BP 112/78 | HR 90 | Temp 98.2°F | Ht 68.0 in | Wt >= 6400 oz

## 2018-05-22 DIAGNOSIS — R739 Hyperglycemia, unspecified: Secondary | ICD-10-CM | POA: Diagnosis not present

## 2018-05-22 DIAGNOSIS — K219 Gastro-esophageal reflux disease without esophagitis: Secondary | ICD-10-CM

## 2018-05-22 DIAGNOSIS — E785 Hyperlipidemia, unspecified: Secondary | ICD-10-CM

## 2018-05-22 DIAGNOSIS — F411 Generalized anxiety disorder: Secondary | ICD-10-CM | POA: Diagnosis not present

## 2018-05-22 DIAGNOSIS — G4733 Obstructive sleep apnea (adult) (pediatric): Secondary | ICD-10-CM

## 2018-05-22 NOTE — Patient Instructions (Signed)
BEFORE YOU LEAVE: -lab for diabetes lab check -follow up: 3 months  Try the wrist braces at night. Wear loosely. Would recommend orthopedic evaluation if this worsens or is not improving.   We recommend the following healthy lifestyle for LIFE: 1) Small portions. But, make sure to get regular (at least 3 per day), healthy meals and small healthy snacks if needed.  2) Eat a healthy clean diet.   TRY TO EAT: -at least 5-7 servings of low sugar, colorful, and nutrient rich vegetables per day (not corn, potatoes or bananas.) -berries are the best choice if you wish to eat fruit (only eat small amounts if trying to reduce weight)  -lean meets (fish, white meat of chicken or [place]) -vegan proteins for some meals - beans or tofu, whole grains, nuts and seeds -Replace bad fats with good fats - good fats include: fish, nuts and seeds, canola oil, olive oil -small amounts of low fat or non fat dairy -small amounts of100 % whole grains - check the lables -drink plenty of water  AVOID: -SUGAR, sweets, anything with added sugar, corn syrup or sweeteners - must read labels as even foods advertised as "healthy" often are loaded with sugar -if you must have a sweetener, small amounts of stevia may be best -sweetened beverages and artificially sweetened beverages -simple starches (rice, bread, potatoes, pasta, chips, etc - small amounts of 100% whole grains are ok) -red meat, pork, butter -fried foods, fast food, processed food, excessive dairy, eggs and coconut.  3)Get at least 150 minutes of sweaty aerobic exercise per week.  4)Reduce stress - consider counseling, meditation and relaxation to balance other aspects of your life.

## 2018-05-26 LAB — HEMOGLOBIN A1C: Hgb A1c MFr Bld: 6.2 % (ref 4.6–6.5)

## 2018-05-30 MED FILL — DULOXETINE HCL 60 MG CPEP: 60 | 90 days supply | Qty: 90 | Fill #1

## 2018-05-30 MED FILL — OMEPRAZOLE 20 MG CPDR: 20 | 90 days supply | Qty: 90 | Fill #0

## 2018-06-25 ENCOUNTER — Other Ambulatory Visit: Payer: Self-pay | Admitting: Surgery

## 2018-06-25 ENCOUNTER — Other Ambulatory Visit (HOSPITAL_COMMUNITY): Payer: Self-pay | Admitting: Surgery

## 2018-07-10 ENCOUNTER — Ambulatory Visit: Payer: Self-pay | Admitting: Dietician

## 2018-07-14 ENCOUNTER — Other Ambulatory Visit: Payer: Self-pay

## 2018-07-14 ENCOUNTER — Ambulatory Visit (HOSPITAL_COMMUNITY)
Admission: RE | Admit: 2018-07-14 | Discharge: 2018-07-14 | Disposition: A | Discharge status: Home / Self Care | Payer: 59 | Source: Ambulatory Visit | Attending: Surgery | Admitting: Surgery

## 2018-07-15 ENCOUNTER — Encounter: Payer: 59 | Attending: Surgery | Admitting: Dietician

## 2018-07-15 VITALS — Ht 68.0 in | Wt >= 6400 oz

## 2018-07-15 DIAGNOSIS — E669 Obesity, unspecified: Secondary | ICD-10-CM | POA: Insufficient documentation

## 2018-07-15 NOTE — Progress Notes (Signed)
Pre-Op Assessment Visit:  Pre-Operative Sleeve Gastrectomy  Surgery  Medical Nutrition Therapy:  Appt start time: 10:58AM  End time:  11:40AM  Patient was seen on 07/15/2018 for Pre-Operative Nutrition Assessment. Assessment and letter of approval faxed to [facility] Bariatric Surgery Program coordinator on 07/15/2018.   Pt expectation of surgery: To assist her with weight loss   Pt expectation of Dietitian: To help with process of getting used to pre- and post-op meal planning and nutrition   Start weight at [facility]: 422.1 BMI: 64.18  Pt works as an Forensic psychologist. 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 Pt struggles with eating for boredom or stress, and not hunger. Discussed making a list of non-food activities and utilizing those instead of emotional eating. Pt says she likes to read books and quilt and likes to take her dogs to the dog park. Pt is also agreeable to measuring out snacks instead of eating directly from package as a way to help with portion control. Pt reports she does not drink water, likes regular or diet soda instead. Discussed increasing water intake now to get in the habit for post-op. Pt not currently physically active and says she has noticed it has become more difficult to be active the more her weight has increased. Discussed making small gains in physical activity, such as home exercises or splitting the recommended 30 mins per day into 10 minutes 3x per day.   24 hr Dietary Recall:  First Meal: coffee with a lot of cream and sugar or a starbucks coffee, egg bites from starbucks OR jimmy dean biscuit in the car  Snack: none  Second Meal: leftovers OR lean cuisine if not travelling; fast food burger and fries if travelling or chicken tenders  Snack: none  Third Meal: protein and veggie with rice; likes pasta; shredded chicken from crock pot to make tacos or rice  bowl  Snack: sweets; candy while driving; chips or crackers  Beverages: regular soda while travelling; diet soda; coffee; does not routinely drink water   Encouraged to engage in 150 minutes of moderate physical activity including cardiovascular and weight baring weekly  Handouts given during visit include:  . Pre-Op Goals . Bariatric Surgery Protein Shakes During the appointment today the following Pre-Op Goals were reviewed with the patient: . Maintain or lose weight as instructed by your surgeon . Make healthy food choices . Begin to limit portion sizes . Limited concentrated sugars and fried foods . Keep fat/sugar in the single digits per serving on             food labels . Practice CHEWING your food  (aim for 30 chews per bite or until applesauce consistency) . Practice not drinking 15 minutes before, during, and 30 minutes after each meal/snack . Avoid all carbonated beverages  . Avoid/limit caffeinated beverages  . Avoid all sugar-sweetened beverages . Consume 3 meals per day; eat every 3-5 hours . Make a list of non-food related activities . Aim for 64-100 ounces of FLUID daily  . Aim for at least 60-80 grams of PROTEIN daily . Look for a liquid protein source that contain ?15 g protein and ?5 g carbohydrate  (ex: shakes, drinks, shots) . Increase fluid and water intake to 64-100oz per day  . Use My Fitness Pal to track food and activity.  . Try pre-portioning snacks instead of eating them directly  out of the bag.   -Follow diet recommendations listed below   Energy and Macronutrient Recomendations: Calories: 1800 Carbohydrate: 200g Protein: 113g Fat: 53g  Demonstrated degree of understanding via:  Teach Back  Teaching Method Utilized:  Visual Auditory Hands on  Barriers to learning/adherence to lifestyle change: Pt travels for work and has little extra time   Patient to call the [facility] to enroll in Pre-Op and Post-Op Nutrition  Education when surgery date is scheduled.

## 2018-07-15 NOTE — Patient Instructions (Signed)
-  Increase fluid and water intake to 64-100oz per day   -Use My Fitness Pal to track food and activity.   -Try pre-portioning snacks instead of eating them directly out of the bag.

## 2018-08-14 ENCOUNTER — Encounter: Payer: Self-pay | Admitting: Family Medicine

## 2018-08-18 ENCOUNTER — Ambulatory Visit (INDEPENDENT_AMBULATORY_CARE_PROVIDER_SITE_OTHER): Payer: 59 | Admitting: Family Medicine

## 2018-08-18 ENCOUNTER — Other Ambulatory Visit: Payer: Self-pay

## 2018-08-18 ENCOUNTER — Encounter: Payer: Self-pay | Admitting: Family Medicine

## 2018-08-18 DIAGNOSIS — R739 Hyperglycemia, unspecified: Secondary | ICD-10-CM

## 2018-08-18 DIAGNOSIS — E785 Hyperlipidemia, unspecified: Secondary | ICD-10-CM | POA: Diagnosis not present

## 2018-08-18 DIAGNOSIS — F411 Generalized anxiety disorder: Secondary | ICD-10-CM | POA: Diagnosis not present

## 2018-08-18 MED ORDER — DULOXETINE HCL 60 MG PO CPEP: 60.0000 mg | ORAL_CAPSULE | Freq: Every day | ORAL | 1 refills | Status: DC

## 2018-08-18 MED ORDER — OMEPRAZOLE 20 MG PO CPDR: 20.0000 mg | DELAYED_RELEASE_CAPSULE | Freq: Every day | ORAL | 1 refills | Status: DC

## 2018-08-18 MED FILL — DULOXETINE HCL 60 MG CPEP: 60 | 90 days supply | Qty: 90 | Fill #0

## 2018-08-18 MED FILL — OMEPRAZOLE 20 MG CPDR: 20 | 90 days supply | Qty: 90 | Fill #0

## 2018-08-18 NOTE — Progress Notes (Signed)
Virtual Visit via Video Note  I connected with Mary Brandt  on 08/18/18 at  9:45 AM EDT by a video enabled telemedicine application and verified that I am speaking with the correct person using two identifiers.  Location patient: home Location provider:work or home office Persons participating in the virtual visit: patient, provider  I discussed the limitations of evaluation and management by telemedicine and the availability of in person appointments. The patient expressed understanding and agreed to proceed.   HPI:  Mary Brandt is a pleasant 33 y.o. here for follow up. Chronic medical problems summarized below were reviewed for changes. These issues and their treatment remain stable for the most part. She has been working from home and this has helped her her anxiety. Initially when the [place] pandemic hit she was very anxious, now doing better, and no panic recently. Given my leaving clinical practice soon, she plans to establish with her husband's doctor office which is closer for her. She requests refills of her medications. Reports is trying to eat healthy and is getting some exercise. Denies CP, SOB, DOE, treatment intolerance or new symptoms.   Anxiety:  -medications: cymbalta  -Reports mood is stable  Hyperglycemia/Morbid obesity/Hyperlip:  -she feels is a stress eater -she is considering [facility] program [facility] -likes to cook -trying to walk her dog on a regular basis  -wt 374 (1/18) --> 373 (1/19) --> 392 (7/19) --> 403 10/19 --> no weight today  GERD:  -meds: PPI  -Bad heartburn if she stops medicine   OSA:  -seeing pulmonology   ROS: See pertinent positives and negatives per HPI.  Past Medical History:  Diagnosis Date  . Anxiety   . Asthma   . Hyperglycemia   . Hyperlipemia   . Morbid obesity ([place])    sees weight loss clinic  . Nevus    sees dermatologist, Dr. Tonia Brooms  . Obstructive sleep apnea on CPAP     Past Surgical History:  Procedure Laterality  Date  . finger laceration Right 2008   2nd finger  . implanon insertion  06-26-11   left upper arm  . MOLE REMOVAL    . OTHER SURGICAL HISTORY Left [id number]    Implanon  . WISDOM TOOTH EXTRACTION      Family History  Problem Relation Age of Onset  . Atrial fibrillation Mother   . Diabetes Father   . Cancer Father        skin  . Polycystic ovary syndrome Sister   . Depression Sister   . Anxiety disorder Sister   . Bipolar disorder Sister   . Cancer Maternal Grandfather        skin  . Dementia Maternal Grandmother     SOCIAL HX: see hpi   Current Outpatient Medications:  .  ibuprofen (ADVIL,MOTRIN) 200 MG tablet, Take 3 tablets (600 mg total) by mouth every 6 (six) hours as needed for moderate pain., Disp: 30 tablet, Rfl: 0 .  IRON PO, Take by mouth., Disp: , Rfl:  .  levonorgestrel (MIRENA) 20 MCG/24HR IUD, 1 each by Intrauterine route once., Disp: , Rfl:  .  Multiple Vitamins-Minerals (MULTIVITAMIN ADULT PO), Take by mouth., Disp: , Rfl:  .  omeprazole (PRILOSEC) 20 MG capsule, Take 1 capsule (20 mg total) by mouth daily., Disp: 90 capsule, Rfl: 1 .  DULoxetine (CYMBALTA) 60 MG capsule, Take 1 capsule (60 mg total) by mouth daily., Disp: 90 capsule, Rfl: 1  EXAM:  VITALS per patient if applicable:  GENERAL: alert,  oriented, appears well and in no acute distress  HEENT: atraumatic, conjunttiva clear, no obvious abnormalities on inspection of external nose and ears  NECK: normal movements of the head and neck  LUNGS: on inspection no signs of respiratory distress, breathing rate appears normal, no obvious gross SOB, gasping or wheezing  CV: no obvious cyanosis  MS: moves all visible extremities without noticeable abnormality  PSYCH/NEURO: pleasant and cooperative, no obvious depression or anxiety, speech and thought processing grossly intact  ASSESSMENT AND PLAN:  Discussed the following assessment and plan:  Hyperglycemia  Morbid obesity  (HCC)  Hyperlipidemia, unspecified hyperlipidemia type  Generalized anxiety disorder - Plan: DULoxetine (CYMBALTA) 60 MG capsule  Refilled medications. Reviewed last labs and she would prefer to avoid the office for any labs or non-urgent visits for now given the [place] pandemic. She agrees to call to set up a TOC/NPV with her husband's doctor. Encouraged a healthy diet and regular exercise.   I discussed the assessment and treatment plan with the patient. The patient was provided an opportunity to ask questions and all were answered. The patient agreed with the plan and demonstrated an understanding of the instructions.   The patient was advised to call back or seek an in-person evaluation if needed.   Lucretia Kern, DO

## 2018-10-06 ENCOUNTER — Encounter: Payer: Self-pay | Admitting: Family Medicine

## 2018-10-09 ENCOUNTER — Other Ambulatory Visit: Payer: Self-pay

## 2018-10-09 ENCOUNTER — Ambulatory Visit (INDEPENDENT_AMBULATORY_CARE_PROVIDER_SITE_OTHER): Payer: 59 | Admitting: Family Medicine

## 2018-10-09 ENCOUNTER — Encounter: Payer: Self-pay | Admitting: Family Medicine

## 2018-10-09 DIAGNOSIS — R21 Rash and other nonspecific skin eruption: Secondary | ICD-10-CM

## 2018-10-09 MED ORDER — TRIAMCINOLONE ACETONIDE 0.1 % EX CREA: 1.0000 "application " | TOPICAL_CREAM | Freq: Two times a day (BID) | CUTANEOUS | 0 refills | Status: DC

## 2018-10-09 NOTE — Patient Instructions (Signed)
Lotrimin cream apply twice daily for 3-4 weeks.  Use the steroid cream twice daily for 2 weeks.  Follow up in 3-4 weeks, sooner if worsening or other concerns.

## 2018-10-09 NOTE — Progress Notes (Signed)
Virtual Visit via Video Note  I connected with Mary Brandt  on 10/09/18 at  3:00 PM EDT by a video enabled telemedicine application and verified that I am speaking with the correct person using two identifiers.  Location patient: home Location provider:work or home office Persons participating in the virtual visit: patient, provider  I discussed the limitations of evaluation and management by telemedicine and the availability of in person appointments. The patient expressed understanding and agreed to proceed.   HPI:  Rash: -R leg, ankle -x 2 weeks -thinks got a mosquito bite there, but can't remember -mildly pruritic, has dogs, no tick bite that she is aware of -no fevers, malaise, illness   ROS: See pertinent positives and negatives per HPI.  Past Medical History:  Diagnosis Date  . Anxiety   . Asthma   . Hyperglycemia   . Hyperlipemia   . Morbid obesity ([place])    sees weight loss clinic  . Nevus    sees dermatologist, Dr. Tonia Brooms  . Obstructive sleep apnea on CPAP     Past Surgical History:  Procedure Laterality Date  . finger laceration Right 2008   2nd finger  . implanon insertion  06-26-11   left upper arm  . MOLE REMOVAL    . OTHER SURGICAL HISTORY Left [id number]    Implanon  . WISDOM TOOTH EXTRACTION      Family History  Problem Relation Age of Onset  . Atrial fibrillation Mother   . Diabetes Father   . Cancer Father        skin  . Polycystic ovary syndrome Sister   . Depression Sister   . Anxiety disorder Sister   . Bipolar disorder Sister   . Cancer Maternal Grandfather        skin  . Dementia Maternal Grandmother     SOCIAL HX: see hpi  Current Outpatient Medications:  .  DULoxetine (CYMBALTA) 60 MG capsule, Take 1 capsule (60 mg total) by mouth daily., Disp: 90 capsule, Rfl: 1 .  ibuprofen (ADVIL,MOTRIN) 200 MG tablet, Take 3 tablets (600 mg total) by mouth every 6 (six) hours as needed for moderate pain., Disp: 30 tablet, Rfl: 0 .  IRON PO, Take  by mouth., Disp: , Rfl:  .  levonorgestrel (MIRENA) 20 MCG/24HR IUD, 1 each by Intrauterine route once., Disp: , Rfl:  .  Multiple Vitamins-Minerals (MULTIVITAMIN ADULT PO), Take by mouth., Disp: , Rfl:  .  omeprazole (PRILOSEC) 20 MG capsule, Take 1 capsule (20 mg total) by mouth daily., Disp: 90 capsule, Rfl: 1 .  triamcinolone cream (KENALOG) 0.1 %, Apply 1 application topically 2 (two) times daily., Disp: 30 g, Rfl: 0  EXAM:  VITALS per patient if applicable:  GENERAL: alert, oriented, appears well and in no acute distress  HEENT: atraumatic, conjunttiva clear, no obvious abnormalities on inspection of external nose and ears  NECK: normal movements of the head and neck  LUNGS: on inspection no signs of respiratory distress, breathing rate appears normal, no obvious gross SOB, gasping or wheezing  CV: no obvious cyanosis  MS: moves all visible extremities without noticeable abnormality  SKIN: erythematous, irr, patch of mildly raised erythematous skin with fine scale on R ankle, approx 1x2cm  PSYCH/NEURO: pleasant and cooperative, no obvious depression or anxiety, speech and thought processing grossly intact  ASSESSMENT AND PLAN:  Discussed the following assessment and plan:  Skin rash  -we discussed possible serious and likely etiologies, workup and treatment, treatment risks and return precautions. Query fungal,  contact dermatitis, versus other. -after this discussion, Mary Brandt opted for topical tx with antifungal and steroid, recheck in 3-4 weeks. -of course, we advised Mary Brandt  to return or notify a doctor immediately if symptoms worsen or persist or new concerns arise.    I discussed the assessment and treatment plan with the patient. The patient was provided an opportunity to ask questions and all were answered. The patient agreed with the plan and demonstrated an understanding of the instructions.   . Follow up instructions: Advised assistant Wendie Simmer to help patient  arrange the following: -follow up with Dr. Maudie Mercury in 3-4 weeks   Lucretia Kern, DO

## 2018-10-14 ENCOUNTER — Telehealth: Payer: Self-pay | Admitting: *Deleted

## 2018-10-14 NOTE — Telephone Encounter (Signed)
I called the pt and she stated she will check her schedule and set up an appt via the Mychart acct.

## 2018-10-14 NOTE — Telephone Encounter (Signed)
-----   Message from Lucretia Kern, DO sent at 10/09/2018  3:10 PM EDT ----- -follow up with Dr. Maudie Mercury in 3-4 weeks

## 2018-11-24 ENCOUNTER — Other Ambulatory Visit: Payer: Self-pay

## 2018-11-24 ENCOUNTER — Encounter: Payer: 59 | Attending: Surgery | Admitting: Skilled Nursing Facility1

## 2018-11-25 NOTE — Progress Notes (Signed)
Pre-Operative Nutrition Class:  Appt start time: 4247   End time:  1830.  Patient was seen on 11/24/2018 for Pre-Operative Bariatric Surgery [facility].   Surgery date: 35 Surgery type: sleeve Start weight at [facility]: 422.1 Weight today: 443  g the learning objectives were met by the patient during this course:  Identify Pre-Op Dietary Goals and will begin 2 weeks pre-operatively  Identify appropriate sources of fluids and proteins   State protein recommendations and appropriate sources pre and post-operatively  Identify Post-Operative Dietary Goals and will follow for 2 weeks post-operatively  Identify appropriate multivitamin and calcium sources  Describe the need for physical activity post-operatively and will follow MD recommendations  State when to call healthcare provider regarding medication questions or post-operative complications  Handouts given during class include:  Pre-Op Bariatric Surgery Diet Handout  Protein Shake Handout  Post-Op Bariatric Surgery Nutrition Handout  [facility] Program Information Flyer  Support Group Information Flyer  [facility] Bariatric Supplements Price List  Follow-Up Plan: Patient will follow-up at [facility] 2 weeks post operatively for diet advancement per MD.

## 2018-11-26 MED FILL — DULOXETINE HCL 60 MG CPEP: 60 | 90 days supply | Qty: 90 | Fill #1

## 2018-11-26 MED FILL — OMEPRAZOLE 20 MG CPDR: 20 | 90 days supply | Qty: 90 | Fill #1

## 2018-11-27 ENCOUNTER — Ambulatory Visit: Payer: Self-pay | Admitting: Surgery

## 2018-11-27 NOTE — H&P (Signed)
Surgical H&P  CC: morbid obesity  HPI: Presents for follow up of morbid obesity. Has completed the preoperative pathway with no barriers identified. Although she's had no major changes to her health, she has struggledsignificantly over the last few months, secondary to the global pandemic/ working from home regained some weight.  She is up to 445 pounds today. She has cut out all beverages, but is still consuming sweetened beverages like lemonade and juice. She udnerstands she needs to work on this. She is working on managing her mind a little more as well.   CXR/UGI 07/14/18: negative, no hiatal hernia Dietician: approved, still has behavioral issues to work on [place]; (3/10 Gaylan Gerold) Psychologist: [facility] 09/13/18 approved Labs 07/22/18 no significant abnormalities   Initial consultation 06/20/18: Weight 420lb/ BMI 63.86. 33yo woman presents for consultation regarding bariatric surgery. She has been struggling with her weight since childhood/early teen years. She states that her mother, while not overweight, was constantly dieting and had body image issues. Her father is overweight, he has health issues secondary to smoking and diabetes. Her sister has had weight loss surgery about 6 months ago and has had many benefits from that. She has been thinking about bariatric surgery for quite some time off and on but has felt like she should be able to accomplish this on her own. She has tried many different types of diets, hCG injections, exercise programs. She notes that with each of these she may lose some weight but when she stops during the program she regains it.  Her weight has been steadily increasing, recently more so because of the change in job too much more sedentary one. She states that she and her husband cook frequently and rarely eat out, but that she has a significant issue with emotional eating, snacking, sugary drinks etc. She has met with the dietitian back in  November, see the note from November 8, she has a lot of behavioral obstacles.   Medical history: Obstructive sleep apnea on CPAP, hyperlipidemia, hyperglycemia, asthma, anxiety, GERD which is well controlled with low-dose daily PPI Surgical history: Wisdom teeth, mole removal, implanon, repair of finger laceration Family history: Atrial fibrillation and her mother, diabetes and cancer in her father, polycystic ovary syndrome/depression/anxiety/bipolar disorder in her sister, dementia and skin cancer in her grandparents, notes that her mother and sister both have postoperative nausea and vomiting and she is worried about how she will handle general anesthesia.  Social history: Alcohol rarely, denies drug use or tobacco use. Works as a Herbalist at [employer]. Married, no children, they have 5 dogs.  Medications: CYmbalta, ibuprofen, iron, mirena, MVI, prilosec 4m po daily  HgA1c was 6.2 (05/22/18).  She is a good candidate for sleeve gastrectomy. We discussed the surgery including technical aspects, the risks of bleeding, infection, pain, scarring, injury to intra-abdominal structures, staple line leak or abscess, chronic abdominal pain or nausea, new onset or worsened GERD, DVT/PE, pneumonia, heart attack, stroke, death, failure to reach weight loss goals and weight regain, hernia. Discussed the typical pre-, peri-, and postoperative course. Discussed the importance of lifelong behavioral changes to combat the chronic and relapsing disease which is obesity. I thoroughly emphasized that this will not make emotional eating any easier, that habit change has to be permanent, and in fact many people with depression or anxiety struggle with worsening symptoms when the coping mechanism of food is removed. She is someone that I would encourage to maintain long-term follow-up with her  psychologist postoperatively and continue meeting with the nutritionist long-term.  No Known  Allergies  Past Medical History:  Diagnosis Date  . Anxiety   . Asthma   . Hyperglycemia   . Hyperlipemia   . Morbid obesity ([place])    sees weight loss clinic  . Nevus    sees dermatologist, Dr. Tonia Brooms  . Obstructive sleep apnea on CPAP     Past Surgical History:  Procedure Laterality Date  . finger laceration Right 2008   2nd finger  . implanon insertion  06-26-11   left upper arm  . MOLE REMOVAL    . OTHER SURGICAL HISTORY Left [id number]    Implanon  . WISDOM TOOTH EXTRACTION      Family History  Problem Relation Age of Onset  . Atrial fibrillation Mother   . Diabetes Father   . Cancer Father        skin  . Polycystic ovary syndrome Sister   . Depression Sister   . Anxiety disorder Sister   . Bipolar disorder Sister   . Cancer Maternal Grandfather        skin  . Dementia Maternal Grandmother     Social History   Socioeconomic History  . Marital status: Married    Spouse name: Mortimer Fries  . Number of children: 0  . Years of education: 79  . Highest education level: Not on file  Occupational History  . Occupation: LEGAL ASSISTANT   . Occupation: Conservator, museum/gallery: [employer]  Social Needs  . Financial resource strain: Not on file  . Food insecurity    Worry: Not on file    Inability: Not on file  . Transportation needs    Medical: Not on file    Non-medical: Not on file  Tobacco Use  . Smoking status: Never Smoker  . Smokeless tobacco: Never Used  Substance and Sexual Activity  . Alcohol use: Yes    Comment: few a month  . Drug use: No  . Sexual activity: Yes    Partners: Male    Birth control/protection: I.U.D.  Lifestyle  . Physical activity    Days per week: Not on file    Minutes per session: Not on file  . Stress: Not on file  Relationships  . Social Herbalist on phone: Not on file    Gets together: Not on file    Attends religious service: Not on file    Active member of club or organization: Not on file     Attends meetings of clubs or organizations: Not on file    Relationship status: Not on file  Other Topics Concern  . Not on file  Social History Narrative      Marital Status: Married Chief Operating Officer)    Children:  None    Pets:  5 dogs   Living Situation: Lives with his husband.   Occupation: Civil Service fast streamer     Education: Forensic psychologist ([place]) [place])    Alcohol Use:  Occasional   Tobacco Use:  None    Drug Use:  None   Diet:  Regular   Exercise:  None   Hobbies:  Reading, Movies              Current Outpatient Medications on File Prior to Visit  Medication Sig Dispense Refill  . DULoxetine (CYMBALTA) 60 MG capsule Take 1 capsule (60 mg total) by mouth daily. 90 capsule 1  .  ibuprofen (ADVIL,MOTRIN) 200 MG tablet Take 3 tablets (600 mg total) by mouth every 6 (six) hours as needed for moderate pain. 30 tablet 0  . IRON PO Take by mouth.    . levonorgestrel (MIRENA) 20 MCG/24HR IUD 1 each by Intrauterine route once.    . Multiple Vitamins-Minerals (MULTIVITAMIN ADULT PO) Take by mouth.    Marland Kitchen omeprazole (PRILOSEC) 20 MG capsule Take 1 capsule (20 mg total) by mouth daily. 90 capsule 1  . triamcinolone cream (KENALOG) 0.1 % Apply 1 application topically 2 (two) times daily. 30 g 0   No current facility-administered medications on file prior to visit.     Review of Systems: a complete, 10pt review of systems was completed with pertinent positives and negatives as documented in the HPI  Physical Exam: Gen: alert and well appearing Eye: extraocular motion intact, no scleral icterus ENT: moist mucus membranes, dentition intact Neck: no mass or thyromegaly Chest: unlabored respirations, symmetrical air entry, clear bilaterally CV: regular rate and rhythm, no pedal edema Abdomen: soft, nontender, nondistended. No mass or organomegaly MSK: strength symmetrical throughout, no deformity Neuro: grossly intact, normal gait Psych: normal mood and affect,  appropriate insight Skin: warm and dry, no rash or lesion on limited exam    CBC Latest Ref Rng & Units 06/03/2017 10/08/2016 05/10/2016  WBC 4.0 - 10.5 K/uL 8.3 14.0(H) 10.2  Hemoglobin 12.0 - 15.0 g/dL 13.7 12.4 11.4(L)  Hematocrit 36.0 - 46.0 % 42.0 38.8 35.9(L)  Platelets 150.0 - 400.0 K/uL 356.0 414.0(H) 400.0    CMP Latest Ref Rng & Units 10/08/2016 01/26/2014 10/05/2013  Glucose 70 - 99 mg/dL 80 89 99  BUN 6 - 23 mg/dL 18 14 17   Creatinine 0.40 - 1.20 mg/dL 0.90 0.9 0.89  Sodium 135 - 145 mEq/L 141 136 137  Potassium 3.5 - 5.1 mEq/L 3.8 3.7 4.2  Chloride 96 - 112 mEq/L 105 104 102  CO2 19 - 32 mEq/L 26 26 25   Calcium 8.4 - 10.5 mg/dL 9.2 8.7 9.4  Total Protein 6.0 - 8.3 g/dL 6.9 7.8 6.9  Total Bilirubin 0.2 - 1.2 mg/dL 0.4 0.4 0.2  Alkaline Phos 39 - 117 U/L 86 94 101  AST 0 - 37 U/L 14 17 12   ALT 0 - 35 U/L 16 21 16     No results found for: INR, PROTIME  Imaging: No results found.    A/P: MORBID OBESITY (E66.01) Story: Her disease is escalating. We had a long discussion today about ongoing need for behavioral changes, she has made some strides in the right direction which is encouraging. Understandable that she has had some lapse secondary to the stress of the pandemic, and she understands the importance of lifelong behavioral change. Plan to proceed with sleeve gastrectomy, pending insurance approval. We have previously discussed the surgery in detail and she understands the risks.   Romana Juniper, MD [facility], [place] Pager [phone]

## 2018-12-04 ENCOUNTER — Telehealth: Payer: Self-pay | Admitting: *Deleted

## 2018-12-04 NOTE — Telephone Encounter (Signed)
Copied from [place] [id number]. Topic: General - Other >> Dec 04, 2018  3:42 PM Leward Quan A wrote: Reason for CRM: [facility] called to say that patient is in the process of having weight loss surgery but her insurance denied the surgery until they get a letter of medical necessity from her primary care physician. She will fax over an outline of what is expected to be in this letter. Please advise

## 2018-12-08 DIAGNOSIS — L84 Corns and callosities: Secondary | ICD-10-CM | POA: Insufficient documentation

## 2018-12-09 ENCOUNTER — Telehealth: Payer: Self-pay | Admitting: *Deleted

## 2018-12-09 ENCOUNTER — Telehealth (INDEPENDENT_AMBULATORY_CARE_PROVIDER_SITE_OTHER): Payer: 59 | Admitting: Family Medicine

## 2018-12-09 ENCOUNTER — Other Ambulatory Visit: Payer: Self-pay

## 2018-12-09 ENCOUNTER — Encounter: Payer: Self-pay | Admitting: Family Medicine

## 2018-12-09 VITALS — Ht 68.0 in | Wt >= 6400 oz

## 2018-12-09 DIAGNOSIS — R739 Hyperglycemia, unspecified: Secondary | ICD-10-CM | POA: Diagnosis not present

## 2018-12-09 DIAGNOSIS — G4733 Obstructive sleep apnea (adult) (pediatric): Secondary | ICD-10-CM

## 2018-12-09 DIAGNOSIS — E785 Hyperlipidemia, unspecified: Secondary | ICD-10-CM

## 2018-12-09 DIAGNOSIS — F411 Generalized anxiety disorder: Secondary | ICD-10-CM

## 2018-12-09 DIAGNOSIS — K219 Gastro-esophageal reflux disease without esophagitis: Secondary | ICD-10-CM

## 2018-12-09 MED ORDER — OMEPRAZOLE 20 MG PO CPDR: 20.0000 mg | DELAYED_RELEASE_CAPSULE | Freq: Every day | ORAL | 1 refills | Status: DC

## 2018-12-09 MED ORDER — DULOXETINE HCL 60 MG PO CPEP: 60.0000 mg | ORAL_CAPSULE | Freq: Every day | ORAL | 1 refills | Status: DC

## 2018-12-09 NOTE — Progress Notes (Signed)
Lab appt scheduled for 8/11 and patient declined CPE appointment as she prefers to see her GYN yearly.

## 2018-12-09 NOTE — Telephone Encounter (Signed)
I left a detailed message at the pts cell number to call back for an appt as below.

## 2018-12-09 NOTE — Progress Notes (Signed)
Virtual Visit via Video Note  I connected with Mary Brandt  on 12/09/18 at  1:20 PM EDT by a video enabled telemedicine application and verified that I am speaking with the correct person using two identifiers.  Location patient: home Location provider:work or home office Persons participating in the virtual visit: patient, provider  I discussed the limitations of evaluation and management by telemedicine and the availability of in person appointments. The patient expressed understanding and agreed to proceed.   HPI:   Anxiety:  -medications: cymbalta  -Reports mood is stable  Hyperglycemia/Morbid obesity/Hyperlip:  -she feels is a stress eater -unfortunately she has gained 25 lbs this year and feels is related to the pandemic and being cooped up inside with not as much activity -she has tried many diet plans and exercise programs including [place], weight watchers a number of times, personal trainers, the keto diet, my fitness pal and calorie counting and boot camps. -she likes to cook and walk her dog -she is frustrated with her wt and feel it impacts her health in many ways, she is considering bariatric surgery with [facility] and needs a letter from [place] regarding this -wt 374 (1/18) -->373 (1/19) -->392 (7/19) -->403 10/19 -->  wt 425 today (12/09/2018) -last hgba1c 6.2, cholesterol was improved last check last summer  GERD:  -meds: PPI  -Bad heartburn if she stops medicine  -stable currently - but requests refill  OSA:  -seeing pulmonology  -uses CPAP  ROS: See pertinent positives and negatives per HPI.  Past Medical History:  Diagnosis Date  . Anxiety   . Asthma   . Hyperglycemia   . Hyperlipemia   . Morbid obesity ([place])    sees weight loss clinic  . Nevus    sees dermatologist, Dr. Tonia Brooms  . Obstructive sleep apnea on CPAP     Past Surgical History:  Procedure Laterality Date  . finger laceration Right 2008   2nd finger  . implanon insertion  06-26-11    left upper arm  . MOLE REMOVAL    . OTHER SURGICAL HISTORY Left [id number]    Implanon  . WISDOM TOOTH EXTRACTION      Family History  Problem Relation Age of Onset  . Atrial fibrillation Mother   . Diabetes Father   . Cancer Father        skin  . Polycystic ovary syndrome Sister   . Depression Sister   . Anxiety disorder Sister   . Bipolar disorder Sister   . Cancer Maternal Grandfather        skin  . Dementia Maternal Grandmother     SOCIAL HX: see hpi   Current Outpatient Medications:  .  ibuprofen (ADVIL,MOTRIN) 200 MG tablet, Take 3 tablets (600 mg total) by mouth every 6 (six) hours as needed for moderate pain., Disp: 30 tablet, Rfl: 0 .  IRON PO, Take by mouth., Disp: , Rfl:  .  levonorgestrel (MIRENA) 20 MCG/24HR IUD, 1 each by Intrauterine route once., Disp: , Rfl:  .  Multiple Vitamins-Minerals (MULTIVITAMIN ADULT PO), Take by mouth., Disp: , Rfl:  .  omeprazole (PRILOSEC) 20 MG capsule, Take 1 capsule (20 mg total) by mouth daily., Disp: 90 capsule, Rfl: 1 .  triamcinolone cream (KENALOG) 0.1 %, Apply 1 application topically 2 (two) times daily., Disp: 30 g, Rfl: 0 .  DULoxetine (CYMBALTA) 60 MG capsule, Take 1 capsule (60 mg total) by mouth daily., Disp: 90 capsule, Rfl: 1  EXAM:  VITALS per patient if applicable:  Today's Vitals   12/09/18 1332  Weight: (!) 425 lb (192.8 kg)  Height: 5\' 8"  (1.727 m)   Body mass index is 64.62 kg/m.   GENERAL: alert, oriented, appears well and in no acute distress  HEENT: atraumatic, conjunttiva clear, no obvious abnormalities on inspection of external nose and ears  NECK: normal movements of the head and neck  LUNGS: on inspection no signs of respiratory distress, breathing rate appears normal, no obvious gross SOB, gasping or wheezing  CV: no obvious cyanosis  MS: moves all visible extremities without noticeable abnormality  PSYCH/NEURO: pleasant and cooperative, no obvious depression or anxiety, speech and  thought processing grossly intact  ASSESSMENT AND PLAN:  Discussed the following assessment and plan:  Hyperglycemia - Plan: Hemoglobin A1c  Morbid obesity (HCC) -  Gastroesophageal reflux disease without esophagitis  OSA (obstructive sleep apnea)   Generalized anxiety disorder  Hyperlipidemia, unspecified hyperlipidemia type - Plan: Lipid panel  -refills provided -discussed her weight and her many failed atrempts for weight loss -have advised weight reduction as feel her health would greatly benefit, wrote letrer to [facility] regarding her health and weight per her request -labs per orders, advised assistant to contact her to schedule -follow up 4-6 months    I discussed the assessment and treatment plan with the patient. The patient was provided an opportunity to ask questions and all were answered. The patient agreed with the plan and demonstrated an understanding of the instructions.   The patient was advised to call back or seek an in-person evaluation if the symptoms worsen or if the condition fails to improve as anticipated.   Follow up instructions: Advised assistant Wendie Simmer to help patient arrange the following: -lab appt in next 1-2 weeks -CPE with Dr. Ethlyn Gallery in 4-6 months  Lucretia Kern, DO

## 2018-12-09 NOTE — Telephone Encounter (Signed)
-----   Message from Lucretia Kern, DO sent at 12/09/2018 10:23 AM EDT ----- Regarding: RE: Letter of [place],  Please set patient up with a visit to discuss and ensure we put accurate information into her letter for surgery. Then we can complete the letter.  Alyse Low we will work on this and try to get it to you ASAP.  Thanks! ----- Message ----- From: Ivor Costa Sent: 12/09/2018   9:44 AM EDT To: Lucretia Kern, DO Subject: Letter of Medical Necessity                    Good morning Dr. Maudie Mercury,  I am working with the above patient in attempt to get her approved for Bariatric Surgery.  I did submit her chart to [facility] but, they denied the surgery until I have a letter of medical necessity from a Provider other than  the Surgeon.  Would you be willing to write the letter on behalf of the patient?  If so, the following need to be included in the letter.  1. List of the patient's current co-morbidities  2. Failed attempts at diets/weight loss 3. Current weight, height & BMI 4. Past five years of weights (if available) 5. Endorsing your support for weight loss surgery as their Provider  When completed, please fax the letter to [facility] [phone],  Attention:  Ivor Costa  Please feel free to give me a call at [phone] with any questions or concerns you may have regarding the Letter of Medical Necessity.  Sincerely,  Ivor Costa, [facility] Bariatric Coordinator

## 2018-12-09 NOTE — Telephone Encounter (Signed)
Sent via epic today.

## 2018-12-16 ENCOUNTER — Other Ambulatory Visit: Payer: Self-pay

## 2018-12-16 ENCOUNTER — Other Ambulatory Visit (INDEPENDENT_AMBULATORY_CARE_PROVIDER_SITE_OTHER): Payer: 59

## 2018-12-16 DIAGNOSIS — E785 Hyperlipidemia, unspecified: Secondary | ICD-10-CM

## 2018-12-16 DIAGNOSIS — R739 Hyperglycemia, unspecified: Secondary | ICD-10-CM

## 2018-12-16 LAB — LIPID PANEL
Cholesterol: 155 mg/dL (ref 0–200)
HDL: 44.7 mg/dL (ref >39.00)
LDL Cholesterol: 90 mg/dL (ref 0–99)
NonHDL: 110.54
Total CHOL/HDL Ratio: 3
Triglycerides: 101 mg/dL (ref 0.0–149.0)
VLDL: 20.2 mg/dL (ref 0.0–40.0)

## 2018-12-16 LAB — HEMOGLOBIN A1C: Hgb A1c MFr Bld: 6.6 % — ABNORMAL HIGH (ref 4.6–6.5)

## 2018-12-23 ENCOUNTER — Ambulatory Visit: Payer: 59

## 2018-12-30 NOTE — Progress Notes (Signed)
PCP - Dr. Colin Benton ([place] 12-09-18) Cardiologist -   Chest x-ray - 07-14-18   EKG - 07-14-18 Stress Test -  ECHO -  Cardiac Cath -   Sleep Study -   CPAP - Utilizes CPAP (instructed to bring mask and tubing)  Fasting Blood Sugar -  Checks Blood Sugar _____ times a day  HGA1C-6.6 on 12-16-18    Blood Thinner Instructions: Aspirin Instructions: Last Dose:  Anesthesia review: BMI greater than 50. Discussed with Iver Nestle, No prior procedure required intubation. Pt has only had past minor surgery.   Patient denies shortness of breath, fever, cough and chest pain at PAT appointment   Patient verbalized understanding of instructions that were given to them at the PAT appointment. Patient was also instructed that they will need to review over the PAT instructions again at home before surgery.

## 2018-12-30 NOTE — Patient Instructions (Addendum)
YOU NEED TO HAVE A COVID 19 TEST ON 01-01-19  @ 2:50 PM, THIS TEST MUST BE DONE BEFORE SURGERY, COME  [place], [place] , [address]. ONCE YOUR COVID TEST IS COMPLETED, PLEASE BEGIN THE QUARANTINE INSTRUCTIONS AS OUTLINED IN YOUR HANDOUT.                Mary Brandt  12/30/2018   Your procedure is scheduled on: 01-05-19    Report to [facility] Main  Entrance    Report to Admitting at 9:15 AM   1 VISITOR IS ALLOWED TO WAIT IN WAITING ROOM  ONLY DAY OF YOUR SURGERY. NO VISITORS ARE ALLOWED IN SHORT STAY OR RECOVERY ROOM.   Call this number if you have problems the morning of surgery [phone]   MORNING OF SURGERY DRINK:   DRINK 1 G2 drink BEFORE YOU LEAVE HOME, DRINK ALL OF THE  G2 DRINK AT ONE TIME.    NO SOLID FOOD AFTER 600 PM THE NIGHT BEFORE YOUR SURGERY. YOU MAY DRINK CLEAR FLUIDS. THE G2 DRINK YOU DRINK BEFORE YOU LEAVE HOME WILL BE THE LAST FLUIDS YOU DRINK BEFORE SURGERY.  PAIN IS EXPECTED AFTER SURGERY AND WILL NOT BE COMPLETELY ELIMINATED. AMBULATION AND TYLENOL WILL HELP REDUCE INCISIONAL AND GAS PAIN. MOVEMENT IS KEY!  YOU ARE EXPECTED TO BE OUT OF BED WITHIN 4 HOURS OF ADMISSION TO YOUR PATIENT ROOM.  SITTING IN THE RECLINER THROUGHOUT THE DAY IS IMPORTANT FOR DRINKING FLUIDS AND MOVING GAS THROUGHOUT THE GI TRACT.  COMPRESSION STOCKINGS SHOULD BE WORN [place] UNLESS YOU ARE WALKING.   INCENTIVE SPIROMETER SHOULD BE USED EVERY HOUR WHILE AWAKE TO DECREASE POST-OPERATIVE COMPLICATIONS SUCH AS PNEUMONIA.  WHEN DISCHARGED HOME, IT IS IMPORTANT TO CONTINUE TO WALK EVERY HOUR AND USE THE INCENTIVE SPIROMETER EVERY HOUR.     CLEAR LIQUID DIET   Foods Allowed                                                                     Foods Excluded  Coffee and tea, regular and decaf                             liquids that you cannot  Plain Jell-O any favor except red or purple                                           see  through such as: Fruit ices (not with fruit pulp)                                     milk, soups, orange juice  Iced Popsicles                                    All solid food Carbonated beverages, regular and diet  Cranberry, grape and apple juices Sports drinks like Gatorade Lightly seasoned clear broth or consume(fat free) Sugar, honey syrup   _____________________________________________________________________     Take these medicines the morning of surgery with A SIP OF WATER:None   BRUSH YOUR TEETH MORNING OF SURGERY AND RINSE YOUR MOUTH OUT, NO CHEWING GUM CANDY OR MINTS.                               You may not have any metal on your body including hair pins and              piercings     Do not wear jewelry, make-up, lotions, powders or perfumes, deodorant              Do not wear nail polish.  Do not shave  48 hours prior to surgery.     Do not bring valuables to the hospital. [place].  Contacts, dentures or bridgework may not be worn into surgery.  Leave suitcase in the car. After surgery it may be brought to your room.     Special Instructions: N/A              Please read over the following fact sheets you were given: _____________________________________________________________________  [facility] - Preparing for Surgery Before surgery, you can play an important role.  Because skin is not sterile, your skin needs to be as free of germs as possible.  You can reduce the number of germs on your skin by washing with CHG (chlorahexidine gluconate) soap before surgery.  CHG is an antiseptic cleaner which kills germs and bonds with the skin to continue killing germs even after washing. Please DO NOT use if you have an allergy to CHG or antibacterial soaps.  If your skin becomes reddened/irritated stop using the CHG and inform your nurse when you arrive at Short Stay. Do not shave  (including legs and underarms) for at least 48 hours prior to the first CHG shower.  You may shave your face/neck. Please follow these instructions carefully:  1.  Shower with CHG Soap the night before surgery and the  morning of Surgery.  2.  If you choose to wash your hair, wash your hair first as usual with your  normal  shampoo.  3.  After you shampoo, rinse your hair and body thoroughly to remove the  shampoo.                           4.  Use CHG as you would any other liquid soap.  You can apply chg directly  to the skin and wash                       Gently with a scrungie or clean washcloth.  5.  Apply the CHG Soap to your body ONLY FROM THE NECK DOWN.   Do not use on face/ open                           Wound or open sores. Avoid contact with eyes, ears mouth and genitals (private parts).  Wash face,  Genitals (private parts) with your normal soap.             6.  Wash thoroughly, paying special attention to the area where your surgery  will be performed.  7.  Thoroughly rinse your body with warm water from the neck down.  8.  DO NOT shower/wash with your normal soap after using and rinsing off  the CHG Soap.                9.  Pat yourself dry with a clean towel.            10.  Wear clean pajamas.            11.  Place clean sheets on your bed the night of your first shower and do not  sleep with pets. Day of Surgery : Do not apply any lotions/deodorants the morning of surgery.  Please wear clean clothes to the hospital/surgery center.  FAILURE TO FOLLOW THESE INSTRUCTIONS MAY RESULT IN THE CANCELLATION OF YOUR SURGERY PATIENT SIGNATURE_________________________________  NURSE SIGNATURE__________________________________  ________________________________________________________________________

## 2019-01-01 ENCOUNTER — Other Ambulatory Visit: Payer: Self-pay

## 2019-01-01 ENCOUNTER — Encounter (HOSPITAL_COMMUNITY): Payer: Self-pay

## 2019-01-01 ENCOUNTER — Other Ambulatory Visit (HOSPITAL_COMMUNITY)
Admission: RE | Admit: 2019-01-01 | Discharge: 2019-01-01 | Disposition: A | Discharge status: Home / Self Care | Payer: 59 | Source: Ambulatory Visit | Attending: Surgery | Admitting: Surgery

## 2019-01-01 ENCOUNTER — Encounter (HOSPITAL_COMMUNITY)
Admission: RE | Admit: 2019-01-01 | Discharge: 2019-01-01 | Disposition: A | Discharge status: Home / Self Care | Payer: 59 | Source: Ambulatory Visit | Attending: Surgery | Admitting: Surgery

## 2019-01-01 DIAGNOSIS — Z01812 Encounter for preprocedural laboratory examination: Secondary | ICD-10-CM | POA: Diagnosis not present

## 2019-01-01 DIAGNOSIS — Z20828 Contact with and (suspected) exposure to other viral communicable diseases: Secondary | ICD-10-CM | POA: Insufficient documentation

## 2019-01-01 LAB — CBC WITH DIFFERENTIAL/PLATELET
Abs Immature Granulocytes: 0.03 10*3/uL (ref 0.00–0.07)
Basophils Absolute: 0 10*3/uL (ref 0.0–0.1)
Basophils Relative: 1 %
Eosinophils Absolute: 0.2 10*3/uL (ref 0.0–0.5)
Eosinophils Relative: 2 %
HCT: 45.4 % (ref 36.0–46.0)
Hemoglobin: 13.7 g/dL (ref 12.0–15.0)
Immature Granulocytes: 0 %
Lymphocytes Relative: 31 %
Lymphs Abs: 2.4 10*3/uL (ref 0.7–4.0)
MCH: 26.7 pg (ref 26.0–34.0)
MCHC: 30.2 g/dL (ref 30.0–36.0)
MCV: 88.3 fL (ref 80.0–100.0)
Monocytes Absolute: 0.8 10*3/uL (ref 0.1–1.0)
Monocytes Relative: 10 %
Neutro Abs: 4.3 10*3/uL (ref 1.7–7.7)
Neutrophils Relative %: 56 %
Platelets: 345 10*3/uL (ref 150–400)
RBC: 5.14 MIL/uL — ABNORMAL HIGH (ref 3.87–5.11)
RDW: 15.9 % — ABNORMAL HIGH (ref 11.5–15.5)
WBC: 7.7 10*3/uL (ref 4.0–10.5)
nRBC: 0 % (ref 0.0–0.2)

## 2019-01-01 LAB — COMPREHENSIVE METABOLIC PANEL
ALT: 35 U/L (ref 0–44)
AST: 22 U/L (ref 15–41)
Albumin: 3.9 g/dL (ref 3.5–5.0)
Alkaline Phosphatase: 83 U/L (ref 38–126)
Anion gap: 10 (ref 5–15)
BUN: 19 mg/dL (ref 6–20)
CO2: 26 mmol/L (ref 22–32)
Calcium: 9 mg/dL (ref 8.9–10.3)
Chloride: 104 mmol/L (ref 98–111)
Creatinine, Ser: 0.8 mg/dL (ref 0.44–1.00)
GFR calc Af Amer: >60 mL/min (ref >60)
GFR calc non Af Amer: >60 mL/min (ref >60)
Glucose, Bld: 96 mg/dL (ref 70–99)
Potassium: 4.1 mmol/L (ref 3.5–5.1)
Sodium: 140 mmol/L (ref 135–145)
Total Bilirubin: 0.5 mg/dL (ref 0.3–1.2)
Total Protein: 7.5 g/dL (ref 6.5–8.1)

## 2019-01-01 LAB — ABO/RH: ABO/RH(D): O POS

## 2019-01-01 LAB — SARS CORONAVIRUS 2 (TAT 6-24 HRS): SARS Coronavirus 2: NEGATIVE

## 2019-01-04 MED ORDER — BUPIVACAINE LIPOSOME 1.3 % IJ SUSP
20.0000 mL | Freq: Once | INTRAMUSCULAR | Status: DC
  Filled 2019-01-04: qty 20

## 2019-01-05 ENCOUNTER — Other Ambulatory Visit: Payer: Self-pay

## 2019-01-05 ENCOUNTER — Encounter (HOSPITAL_COMMUNITY): Payer: Self-pay | Admitting: *Deleted

## 2019-01-05 ENCOUNTER — Encounter (HOSPITAL_COMMUNITY)
Admission: RE | Disposition: A | Discharge status: Home / Self Care | Payer: Self-pay | Source: Home / Self Care | Attending: Surgery

## 2019-01-05 ENCOUNTER — Inpatient Hospital Stay (HOSPITAL_COMMUNITY)
Admission: RE | Admit: 2019-01-05 | Discharge: 2019-01-06 | DRG: 621 | Disposition: A | Discharge status: Home / Self Care | Payer: 59 | Attending: Surgery | Admitting: Surgery

## 2019-01-05 ENCOUNTER — Inpatient Hospital Stay (HOSPITAL_COMMUNITY): Payer: 59 | Admitting: Certified Registered Nurse Anesthetist

## 2019-01-05 ENCOUNTER — Inpatient Hospital Stay (HOSPITAL_COMMUNITY): Payer: 59 | Admitting: Physician Assistant

## 2019-01-05 DIAGNOSIS — Z6841 Body Mass Index (BMI) 40.0 and over, adult: Secondary | ICD-10-CM | POA: Diagnosis not present

## 2019-01-05 DIAGNOSIS — Z808 Family history of malignant neoplasm of other organs or systems: Secondary | ICD-10-CM

## 2019-01-05 DIAGNOSIS — Z818 Family history of other mental and behavioral disorders: Secondary | ICD-10-CM

## 2019-01-05 DIAGNOSIS — E785 Hyperlipidemia, unspecified: Secondary | ICD-10-CM | POA: Diagnosis present

## 2019-01-05 DIAGNOSIS — F419 Anxiety disorder, unspecified: Secondary | ICD-10-CM | POA: Diagnosis present

## 2019-01-05 DIAGNOSIS — E119 Type 2 diabetes mellitus without complications: Secondary | ICD-10-CM | POA: Diagnosis present

## 2019-01-05 DIAGNOSIS — G4733 Obstructive sleep apnea (adult) (pediatric): Secondary | ICD-10-CM | POA: Diagnosis present

## 2019-01-05 DIAGNOSIS — K219 Gastro-esophageal reflux disease without esophagitis: Secondary | ICD-10-CM | POA: Diagnosis present

## 2019-01-05 DIAGNOSIS — Z833 Family history of diabetes mellitus: Secondary | ICD-10-CM

## 2019-01-05 DIAGNOSIS — J45909 Unspecified asthma, uncomplicated: Secondary | ICD-10-CM | POA: Diagnosis present

## 2019-01-05 HISTORY — PX: LAPAROSCOPIC GASTRIC SLEEVE RESECTION: SHX5895

## 2019-01-05 LAB — TYPE AND SCREEN
ABO/RH(D): O POS
Antibody Screen: NEGATIVE

## 2019-01-05 LAB — PREGNANCY, URINE: Preg Test, Ur: NEGATIVE

## 2019-01-05 SURGERY — GASTRECTOMY, SLEEVE, LAPAROSCOPIC
Anesthesia: General | Site: Abdomen

## 2019-01-05 MED ORDER — KETOROLAC TROMETHAMINE 15 MG/ML IJ SOLN
INTRAMUSCULAR | Status: AC
  Administered 2019-01-05: 15 mg via INTRAVENOUS
  Filled 2019-01-05: qty 1

## 2019-01-05 MED ORDER — MIDAZOLAM HCL 5 MG/5ML IJ SOLN
INTRAMUSCULAR | Status: DC | PRN
  Administered 2019-01-05: 2 mg via INTRAVENOUS

## 2019-01-05 MED ORDER — ROCURONIUM BROMIDE 10 MG/ML (PF) SYRINGE
PREFILLED_SYRINGE | INTRAVENOUS | Status: AC
  Filled 2019-01-05: qty 10

## 2019-01-05 MED ORDER — SODIUM CHLORIDE 0.9 % IV SOLN
2.0000 g | INTRAVENOUS | Status: AC
  Administered 2019-01-05: 2 g via INTRAVENOUS
  Filled 2019-01-05: qty 2

## 2019-01-05 MED ORDER — OXYCODONE HCL 5 MG/5ML PO SOLN: 5.0000 mg | Freq: Once | ORAL | Status: DC | PRN

## 2019-01-05 MED ORDER — FENTANYL CITRATE (PF) 100 MCG/2ML IJ SOLN
INTRAMUSCULAR | Status: AC
  Filled 2019-01-05: qty 2

## 2019-01-05 MED ORDER — BUPIVACAINE HCL 0.25 % IJ SOLN
INTRAMUSCULAR | Status: DC | PRN
  Administered 2019-01-05: 30 mL

## 2019-01-05 MED ORDER — LIDOCAINE 2% (20 MG/ML) 5 ML SYRINGE
INTRAMUSCULAR | Status: AC
  Filled 2019-01-05: qty 5

## 2019-01-05 MED ORDER — ESMOLOL HCL 100 MG/10ML IV SOLN
INTRAVENOUS | Status: AC
  Filled 2019-01-05: qty 10

## 2019-01-05 MED ORDER — METHOCARBAMOL 1000 MG/10ML IJ SOLN
500.0000 mg | Freq: Four times a day (QID) | INTRAVENOUS | Status: DC | PRN
  Filled 2019-01-05: qty 5

## 2019-01-05 MED ORDER — SODIUM CHLORIDE 0.9 % IV SOLN
INTRAVENOUS | Status: DC
  Administered 2019-01-05 (×2): via INTRAVENOUS

## 2019-01-05 MED ORDER — DEXAMETHASONE SODIUM PHOSPHATE 10 MG/ML IJ SOLN
INTRAMUSCULAR | Status: AC
  Filled 2019-01-05: qty 1

## 2019-01-05 MED ORDER — ENOXAPARIN SODIUM 40 MG/0.4ML ~~LOC~~ SOLN
40.0000 mg | SUBCUTANEOUS | Status: AC
  Administered 2019-01-05: 10:00:00 40 mg via SUBCUTANEOUS
  Filled 2019-01-05: qty 0.4

## 2019-01-05 MED ORDER — HYDROMORPHONE HCL 1 MG/ML IJ SOLN
0.5000 mg | INTRAMUSCULAR | Status: DC | PRN
  Administered 2019-01-05 (×2): 0.5 mg via INTRAVENOUS
  Filled 2019-01-05: qty 0.5

## 2019-01-05 MED ORDER — BUPIVACAINE LIPOSOME 1.3 % IJ SUSP
INTRAMUSCULAR | Status: DC | PRN
  Administered 2019-01-05: 20 mL

## 2019-01-05 MED ORDER — LIDOCAINE 2% (20 MG/ML) 5 ML SYRINGE
INTRAMUSCULAR | Status: DC | PRN
  Administered 2019-01-05: 1.5 mg/kg/h via INTRAVENOUS

## 2019-01-05 MED ORDER — GABAPENTIN 300 MG PO CAPS
300.0000 mg | ORAL_CAPSULE | Freq: Three times a day (TID) | ORAL | Status: DC
  Administered 2019-01-05 – 2019-01-06 (×2): 300 mg via ORAL
  Filled 2019-01-05 (×2): qty 1

## 2019-01-05 MED ORDER — CHLORHEXIDINE GLUCONATE 4 % EX LIQD: 60.0000 mL | Freq: Once | CUTANEOUS | Status: DC

## 2019-01-05 MED ORDER — ONDANSETRON HCL 4 MG/2ML IJ SOLN
4.0000 mg | INTRAMUSCULAR | Status: DC | PRN
  Administered 2019-01-05: 4 mg via INTRAVENOUS
  Filled 2019-01-05: qty 2

## 2019-01-05 MED ORDER — LACTATED RINGERS IR SOLN
Status: DC | PRN
  Administered 2019-01-05: 1000 mL

## 2019-01-05 MED ORDER — TRAMADOL HCL 50 MG PO TABS
50.0000 mg | ORAL_TABLET | Freq: Four times a day (QID) | ORAL | Status: DC | PRN
  Administered 2019-01-05 – 2019-01-06 (×2): 50 mg via ORAL
  Filled 2019-01-05 (×2): qty 1

## 2019-01-05 MED ORDER — FENTANYL CITRATE (PF) 250 MCG/5ML IJ SOLN
INTRAMUSCULAR | Status: AC
  Filled 2019-01-05: qty 5

## 2019-01-05 MED ORDER — SIMETHICONE 80 MG PO CHEW: 80.0000 mg | CHEWABLE_TABLET | Freq: Four times a day (QID) | ORAL | Status: DC | PRN

## 2019-01-05 MED ORDER — MIDAZOLAM HCL 2 MG/2ML IJ SOLN
INTRAMUSCULAR | Status: AC
  Filled 2019-01-05: qty 2

## 2019-01-05 MED ORDER — SUGAMMADEX SODIUM 500 MG/5ML IV SOLN
INTRAVENOUS | Status: AC
  Filled 2019-01-05: qty 5

## 2019-01-05 MED ORDER — ENSURE MAX PROTEIN PO LIQD
2.0000 [oz_av] | ORAL | Status: DC
  Administered 2019-01-06 (×2): 2 [oz_av] via ORAL

## 2019-01-05 MED ORDER — PROPOFOL 10 MG/ML IV BOLUS
INTRAVENOUS | Status: DC | PRN
  Administered 2019-01-05: 200 mg via INTRAVENOUS

## 2019-01-05 MED ORDER — HYDROMORPHONE HCL 1 MG/ML IJ SOLN
INTRAMUSCULAR | Status: AC
  Filled 2019-01-05: qty 1

## 2019-01-05 MED ORDER — APREPITANT 40 MG PO CAPS
40.0000 mg | ORAL_CAPSULE | ORAL | Status: AC
  Administered 2019-01-05: 40 mg via ORAL
  Filled 2019-01-05: qty 1

## 2019-01-05 MED ORDER — FENTANYL CITRATE (PF) 250 MCG/5ML IJ SOLN
INTRAMUSCULAR | Status: DC | PRN
  Administered 2019-01-05: 100 ug via INTRAVENOUS
  Administered 2019-01-05: 50 ug via INTRAVENOUS
  Administered 2019-01-05: 100 ug via INTRAVENOUS

## 2019-01-05 MED ORDER — SUCCINYLCHOLINE CHLORIDE 200 MG/10ML IV SOSY
PREFILLED_SYRINGE | INTRAVENOUS | Status: DC | PRN
  Administered 2019-01-05: 120 mg via INTRAVENOUS

## 2019-01-05 MED ORDER — LIDOCAINE HCL 2 % IJ SOLN
INTRAMUSCULAR | Status: AC
  Filled 2019-01-05: qty 20

## 2019-01-05 MED ORDER — PROPOFOL 10 MG/ML IV BOLUS
INTRAVENOUS | Status: AC
  Filled 2019-01-05: qty 20

## 2019-01-05 MED ORDER — FENTANYL CITRATE (PF) 100 MCG/2ML IJ SOLN
25.0000 ug | INTRAMUSCULAR | Status: DC | PRN
  Administered 2019-01-05 (×3): 50 ug via INTRAVENOUS

## 2019-01-05 MED ORDER — DOCUSATE SODIUM 100 MG PO CAPS
100.0000 mg | ORAL_CAPSULE | Freq: Two times a day (BID) | ORAL | Status: DC
  Administered 2019-01-05 – 2019-01-06 (×2): 100 mg via ORAL
  Filled 2019-01-05 (×2): qty 1

## 2019-01-05 MED ORDER — DEXAMETHASONE SODIUM PHOSPHATE 4 MG/ML IJ SOLN: 4.0000 mg | INTRAMUSCULAR | Status: DC

## 2019-01-05 MED ORDER — SCOPOLAMINE 1 MG/3DAYS TD PT72
1.0000 | MEDICATED_PATCH | TRANSDERMAL | Status: DC
  Administered 2019-01-05: 1.5 mg via TRANSDERMAL
  Filled 2019-01-05: qty 1

## 2019-01-05 MED ORDER — KETOROLAC TROMETHAMINE 15 MG/ML IJ SOLN
15.0000 mg | INTRAMUSCULAR | Status: DC
  Administered 2019-01-05: 11:00:00 15 mg via INTRAVENOUS
  Filled 2019-01-05: qty 1

## 2019-01-05 MED ORDER — LACTATED RINGERS IV SOLN
INTRAVENOUS | Status: DC
  Administered 2019-01-05: 10:00:00 via INTRAVENOUS

## 2019-01-05 MED ORDER — HYDRALAZINE HCL 20 MG/ML IJ SOLN: 10.0000 mg | INTRAMUSCULAR | Status: DC | PRN

## 2019-01-05 MED ORDER — OXYCODONE HCL 5 MG PO TABS: 5.0000 mg | ORAL_TABLET | Freq: Once | ORAL | Status: DC | PRN

## 2019-01-05 MED ORDER — ENOXAPARIN SODIUM 30 MG/0.3ML ~~LOC~~ SOLN
30.0000 mg | Freq: Two times a day (BID) | SUBCUTANEOUS | Status: DC
  Administered 2019-01-05 – 2019-01-06 (×2): 30 mg via SUBCUTANEOUS
  Filled 2019-01-05 (×2): qty 0.3

## 2019-01-05 MED ORDER — GABAPENTIN 300 MG PO CAPS
300.0000 mg | ORAL_CAPSULE | ORAL | Status: AC
  Administered 2019-01-05: 300 mg via ORAL
  Filled 2019-01-05: qty 1

## 2019-01-05 MED ORDER — PANTOPRAZOLE SODIUM 40 MG IV SOLR
40.0000 mg | Freq: Every day | INTRAVENOUS | Status: DC
  Administered 2019-01-05: 40 mg via INTRAVENOUS
  Filled 2019-01-05: qty 40

## 2019-01-05 MED ORDER — DEXAMETHASONE SODIUM PHOSPHATE 10 MG/ML IJ SOLN
INTRAMUSCULAR | Status: DC | PRN
  Administered 2019-01-05: 6 mg via INTRAVENOUS

## 2019-01-05 MED ORDER — ACETAMINOPHEN 500 MG PO TABS: 1000.0000 mg | ORAL_TABLET | Freq: Three times a day (TID) | ORAL | Status: DC

## 2019-01-05 MED ORDER — ONDANSETRON HCL 4 MG/2ML IJ SOLN
INTRAMUSCULAR | Status: AC
  Filled 2019-01-05: qty 2

## 2019-01-05 MED ORDER — DULOXETINE HCL 60 MG PO CPEP
60.0000 mg | ORAL_CAPSULE | Freq: Every day | ORAL | Status: DC
  Administered 2019-01-06: 10:00:00 60 mg via ORAL
  Filled 2019-01-05: qty 1

## 2019-01-05 MED ORDER — LIDOCAINE 2% (20 MG/ML) 5 ML SYRINGE
INTRAMUSCULAR | Status: DC | PRN
  Administered 2019-01-05: 80 mg via INTRAVENOUS

## 2019-01-05 MED ORDER — ONDANSETRON HCL 4 MG/2ML IJ SOLN
4.0000 mg | Freq: Four times a day (QID) | INTRAMUSCULAR | Status: AC | PRN
  Administered 2019-01-05: 4 mg via INTRAVENOUS

## 2019-01-05 MED ORDER — ONDANSETRON HCL 4 MG/2ML IJ SOLN
INTRAMUSCULAR | Status: DC | PRN
  Administered 2019-01-05: 4 mg via INTRAVENOUS

## 2019-01-05 MED ORDER — SUCCINYLCHOLINE CHLORIDE 200 MG/10ML IV SOSY
PREFILLED_SYRINGE | INTRAVENOUS | Status: AC
  Filled 2019-01-05: qty 10

## 2019-01-05 MED ORDER — ROCURONIUM BROMIDE 50 MG/5ML IV SOSY
PREFILLED_SYRINGE | INTRAVENOUS | Status: DC | PRN
  Administered 2019-01-05 (×2): 10 mg via INTRAVENOUS
  Administered 2019-01-05: 60 mg via INTRAVENOUS
  Administered 2019-01-05: 10 mg via INTRAVENOUS

## 2019-01-05 MED ORDER — ACETAMINOPHEN 160 MG/5ML PO SOLN
1000.0000 mg | Freq: Three times a day (TID) | ORAL | Status: DC
  Administered 2019-01-05 – 2019-01-06 (×2): 1000 mg via ORAL
  Filled 2019-01-05 (×2): qty 40.6

## 2019-01-05 MED ORDER — 0.9 % SODIUM CHLORIDE (POUR BTL) OPTIME
TOPICAL | Status: DC | PRN
  Administered 2019-01-05: 12:00:00 1000 mL

## 2019-01-05 MED ORDER — METOPROLOL TARTRATE 5 MG/5ML IV SOLN: 5.0000 mg | Freq: Four times a day (QID) | INTRAVENOUS | Status: DC | PRN

## 2019-01-05 MED ORDER — BUPIVACAINE HCL (PF) 0.25 % IJ SOLN
INTRAMUSCULAR | Status: AC
  Filled 2019-01-05: qty 30

## 2019-01-05 MED ORDER — METOCLOPRAMIDE HCL 5 MG/ML IJ SOLN
10.0000 mg | Freq: Four times a day (QID) | INTRAMUSCULAR | Status: DC
  Administered 2019-01-05 – 2019-01-06 (×3): 10 mg via INTRAVENOUS
  Filled 2019-01-05 (×3): qty 2

## 2019-01-05 MED ORDER — ACETAMINOPHEN 500 MG PO TABS
1000.0000 mg | ORAL_TABLET | ORAL | Status: AC
  Administered 2019-01-05: 1000 mg via ORAL
  Filled 2019-01-05: qty 2

## 2019-01-05 MED ORDER — KETAMINE HCL 10 MG/ML IJ SOLN
INTRAMUSCULAR | Status: DC | PRN
  Administered 2019-01-05: 30 mg via INTRAVENOUS

## 2019-01-05 SURGICAL SUPPLY — 75 items
APPLICATOR COTTON TIP 6 STRL (MISCELLANEOUS) IMPLANT
APPLICATOR COTTON TIP 6IN STRL (MISCELLANEOUS)
APPLIER CLIP ROT 10 11.4 M/L (STAPLE)
APPLIER CLIP ROT 13.4 12 LRG (CLIP) ×3
BAG LAPAROSCOPIC 12 15 PORT 16 (BASKET) IMPLANT
BAG RETRIEVAL 12/15 (BASKET) ×2
BAG RETRIEVAL 12/15MM (BASKET) ×1
BENZOIN TINCTURE PRP APPL 2/3 (GAUZE/BANDAGES/DRESSINGS) ×3 IMPLANT
BLADE SURG SZ11 CARB STEEL (BLADE) ×3 IMPLANT
BNDG ADH 1X3 SHEER STRL LF (GAUZE/BANDAGES/DRESSINGS) ×18 IMPLANT
CABLE HIGH FREQUENCY MONO STRZ (ELECTRODE) ×1 IMPLANT
CHLORAPREP W/TINT 26 (MISCELLANEOUS) ×6 IMPLANT
CLIP APPLIE ROT 10 11.4 M/L (STAPLE) IMPLANT
CLIP APPLIE ROT 13.4 12 LRG (CLIP) IMPLANT
CLOSURE WOUND 1/2 X4 (GAUZE/BANDAGES/DRESSINGS) ×1
COVER BACK TABLE 60X90IN (DRAPES) ×2 IMPLANT
COVER SURGICAL LIGHT HANDLE (MISCELLANEOUS) ×3 IMPLANT
COVER WAND RF STERILE (DRAPES) IMPLANT
DECANTER SPIKE VIAL GLASS SM (MISCELLANEOUS) ×3 IMPLANT
DEVICE SUT QUICK LOAD TK 5 (STAPLE) IMPLANT
DEVICE SUT TI-KNOT TK 5X26 (MISCELLANEOUS) IMPLANT
DEVICE TI KNOT TK5 (MISCELLANEOUS)
DRAPE CV SPLIT W-CLR ANES SCRN (DRAPES) ×2 IMPLANT
DRAPE UTILITY XL STRL (DRAPES) ×6 IMPLANT
ELECT REM PT RETURN 15FT ADLT (MISCELLANEOUS) ×3 IMPLANT
GAUZE SPONGE 4X4 12PLY STRL (GAUZE/BANDAGES/DRESSINGS) IMPLANT
GLOVE BIO SURGEON STRL SZ 6 (GLOVE) ×3 IMPLANT
GLOVE INDICATOR 6.5 STRL GRN (GLOVE) ×3 IMPLANT
GOWN STRL REUS W/TWL LRG LVL3 (GOWN DISPOSABLE) ×3 IMPLANT
GOWN STRL REUS W/TWL XL LVL3 (GOWN DISPOSABLE) ×6 IMPLANT
GRASPER SUT TROCAR 14GX15 (MISCELLANEOUS) ×3 IMPLANT
HOVERMATT SINGLE USE (MISCELLANEOUS) ×3 IMPLANT
KIT BASIN OR (CUSTOM PROCEDURE TRAY) ×3 IMPLANT
KIT TURNOVER KIT A (KITS) IMPLANT
MARKER SKIN DUAL TIP RULER LAB (MISCELLANEOUS) ×3 IMPLANT
NDL SPNL 22GX3.5 QUINCKE BK (NEEDLE) ×1 IMPLANT
NEEDLE SPNL 22GX3.5 QUINCKE BK (NEEDLE) ×3 IMPLANT
QUICK LOAD TK 5 (STAPLE)
RELOAD ENDO STITCH (ENDOMECHANICALS) IMPLANT
RELOAD STAPLE 60 3.6 BLU REG (STAPLE) ×1 IMPLANT
RELOAD STAPLE 60 3.8 GOLD REG (STAPLE) ×1 IMPLANT
RELOAD STAPLE 60 4.1 GRN THCK (STAPLE) IMPLANT
RELOAD STAPLER BLUE 60MM (STAPLE) ×4 IMPLANT
RELOAD STAPLER GOLD 60MM (STAPLE) ×1 IMPLANT
RELOAD STAPLER GREEN 60MM (STAPLE) ×1 IMPLANT
RELOAD SUT TRIPLE-STITCH 2-0 (ENDOMECHANICALS) IMPLANT
SCISSORS LAP 5X45 EPIX DISP (ENDOMECHANICALS) ×3 IMPLANT
SET IRRIG TUBING LAPAROSCOPIC (IRRIGATION / IRRIGATOR) ×3 IMPLANT
SET TUBE SMOKE EVAC HIGH FLOW (TUBING) ×3 IMPLANT
SHEARS HARMONIC ACE PLUS 45CM (MISCELLANEOUS) ×3 IMPLANT
SLEEVE ADV FIXATION 5X100MM (TROCAR) ×6 IMPLANT
SLEEVE GASTRECTOMY 40FR VISIGI (MISCELLANEOUS) ×3 IMPLANT
SOL ANTI FOG 6CC (MISCELLANEOUS) ×1 IMPLANT
SOLUTION ANTI FOG 6CC (MISCELLANEOUS) ×2
SPONGE LAP 18X18 RF (DISPOSABLE) ×3 IMPLANT
STAPLER ECHELON BIOABSB 60 FLE (MISCELLANEOUS) ×14 IMPLANT
STAPLER ECHELON LONG 60 440 (INSTRUMENTS) ×3 IMPLANT
STAPLER RELOAD BLUE 60MM (STAPLE) ×12
STAPLER RELOAD GOLD 60MM (STAPLE) ×3
STAPLER RELOAD GREEN 60MM (STAPLE) ×3
STRIP CLOSURE SKIN 1/2X4 (GAUZE/BANDAGES/DRESSINGS) ×2 IMPLANT
SUT MNCRL AB 4-0 PS2 18 (SUTURE) ×3 IMPLANT
SUT SURGIDAC NAB ES-9 0 48 120 (SUTURE) IMPLANT
SUT VICRYL 0 TIES 12 18 (SUTURE) ×3 IMPLANT
SYR 10ML ECCENTRIC (SYRINGE) ×3 IMPLANT
SYR 20ML LL LF (SYRINGE) ×3 IMPLANT
SYR 50ML LL SCALE MARK (SYRINGE) ×3 IMPLANT
TOWEL OR 17X26 10 PK STRL BLUE (TOWEL DISPOSABLE) ×3 IMPLANT
TOWEL OR NON WOVEN STRL DISP B (DISPOSABLE) ×3 IMPLANT
TROCAR ADV FIXATION 5X100MM (TROCAR) ×3 IMPLANT
TROCAR BLADELESS 15MM (ENDOMECHANICALS) ×3 IMPLANT
TROCAR BLADELESS OPT 5 100 (ENDOMECHANICALS) ×3 IMPLANT
TUBING CONNECTING 10 (TUBING) ×2 IMPLANT
TUBING CONNECTING 10' (TUBING) ×1
TUBING ENDO SMARTCAP (MISCELLANEOUS) ×3 IMPLANT

## 2019-01-05 NOTE — Anesthesia Preprocedure Evaluation (Signed)
Anesthesia Evaluation  Patient identified by MRN, date of birth, ID band Patient awake    Reviewed: Allergy & Precautions, H&P , NPO status , Patient's Chart, lab work & pertinent test results  Airway Mallampati: II   Neck ROM: full    Dental   Pulmonary asthma , sleep apnea ,    breath sounds clear to auscultation       Cardiovascular negative cardio ROS   Rhythm:regular Rate:Normal     Neuro/Psych PSYCHIATRIC DISORDERS Anxiety    GI/Hepatic GERD  ,  Endo/Other  Morbid obesity  Renal/GU      Musculoskeletal   Abdominal   Peds  Hematology   Anesthesia Other Findings   Reproductive/Obstetrics                             Anesthesia Physical Anesthesia Plan  ASA: II  Anesthesia Plan: General   Post-op Pain Management:    Induction: Intravenous  PONV Risk Score and Plan: 3 and Ondansetron, Dexamethasone, Midazolam and Treatment may vary due to age or medical condition  Airway Management Planned: Oral ETT  Additional Equipment:   Intra-op Plan:   Post-operative Plan: Extubation in OR  Informed Consent: I have reviewed the patients History and Physical, chart, labs and discussed the procedure including the risks, benefits and alternatives for the proposed anesthesia with the patient or authorized representative who has indicated his/her understanding and acceptance.       Plan Discussed with: CRNA, Anesthesiologist and Surgeon  Anesthesia Plan Comments:         Anesthesia Quick Evaluation

## 2019-01-05 NOTE — Discharge Instructions (Signed)
° ° ° °GASTRIC BYPASS/SLEEVE ° Home Care Instructions ° ° These instructions are to help you care for yourself when you go home. ° °Call: If you have any problems. °• Call [phone] and ask for the surgeon on call °• If you need immediate help, come to the ER at [facility].  °• Tell the ER staff that you are a new post-op gastric bypass or gastric sleeve patient °  °Signs and symptoms to report: • Severe vomiting or nausea °o If you cannot keep down clear liquids for longer than 1 day, call your surgeon  °• Abdominal pain that does not get better after taking your pain medication °• Fever over 100.4° F with chills °• Heart beating over 100 beats a minute °• Shortness of breath at rest °• Chest pain °•  Redness, swelling, drainage, or foul odor at incision (surgical) sites °•  If your incisions open or pull apart °• Swelling or pain in calf (lower leg) °• Diarrhea (Loose bowel movements that happen often), frequent watery, uncontrolled bowel movements °• Constipation, (no bowel movements for 3 days) if this happens: Pick one °o Milk of Magnesia, 2 tablespoons by mouth, 3 times a day for 2 days if needed °o Stop taking Milk of Magnesia once you have a bowel movement °o Call your doctor if constipation continues °Or °o Miralax  (instead of Milk of Magnesia) following the label instructions °o Stop taking Miralax once you have a bowel movement °o Call your doctor if constipation continues °• Anything you think is not normal °  °Normal side effects after surgery: • Unable to sleep at night or unable to focus °• Irritability or moody °• Being tearful (crying) or depressed °These are common complaints, possibly related to your anesthesia medications that put you to sleep, stress of surgery, and change in lifestyle.  This usually goes away a few weeks after surgery.  If these feelings continue, call your primary care doctor. °  °Wound Care: You may have surgical glue, steri-strips, or staples over your incisions after  surgery °• Surgical glue:  Looks like a clear film over your incisions and will wear off a little at a time °• Steri-strips: Strips of tape over your incisions. You may notice a yellowish color on the skin under the steri-strips. This is used to make the   steri-strips stick better. Do not pull the steri-strips off - let them fall off °• Staples: Staples may be removed before you leave the hospital °o If you go home with staples, call [facility], [phone] at for an appointment with your surgeon’s nurse to have staples removed 10 days after surgery. °• Showering: You may shower two (2) days after your surgery unless your surgeon tells you differently °o Wash gently around incisions with warm soapy water, rinse well, and gently pat dry  °o No tub baths until staples are removed, steri-strips fall off or glue is gone.  °  °Medications: • Medications should be liquid or crushed if larger than the size of a dime °• Extended release pills (medication that release a little bit at a time through the day) should NOT be crushed or cut. (examples include XL, ER, DR, SR) °• Depending on the size and number of medications you take, you may need to space (take a few throughout the day)/change the time you take your medications so that you do not over-fill your pouch (smaller stomach) °• Make sure you follow-up with your primary care doctor to   make medication changes needed during rapid weight loss and life-style changes °• If you have diabetes, follow up with the doctor that orders your diabetes medication(s) within one week after surgery and check your blood sugar regularly. °• Do not drive while taking prescription pain medication  °• It is ok to take Tylenol by the bottle instructions with your pain medicine or instead of your pain medicine as needed.  DO NOT TAKE NSAIDS (EXAMPLES OF NSAIDS:  IBUPROFREN/ NAPROXEN)  °Diet:                    First 2 Weeks ° You will see the dietician t about two (2) weeks  after your surgery. The dietician will increase the types of foods you can eat if you are handling liquids well: °• If you have severe vomiting or nausea and cannot keep down clear liquids lasting longer than 1 day, call your surgeon @ ([phone]) °Protein Shake °• Drink at least 2 ounces of shake 5-6 times per day °• Each serving of protein shakes (usually 8 - 12 ounces) should have: °o 15 grams of protein  °o And no more than 5 grams of carbohydrate  °• Goal for protein each day: °o Men = 80 grams per day °o Women = 60 grams per day °• Protein powder may be added to fluids such as non-fat milk or Lactaid milk or unsweetened Soy/Almond milk (limit to 35 grams added protein powder per serving) ° °Hydration °• Slowly increase the amount of water and other clear liquids as tolerated (See Acceptable Fluids) °• Slowly increase the amount of protein shake as tolerated  °•  Sip fluids slowly and throughout the day.  Do not use straws. °• May use sugar substitutes in small amounts (no more than 6 - 8 packets per day; i.e. Splenda) ° °Fluid Goal °• The first goal is to drink at least 8 ounces of protein shake/drink per day (or as directed by the nutritionist); some examples of protein shakes are Syntrax Nectar, Adkins Advantage, EAS Edge HP, and Unjury. See handout from pre-op Bariatric Education Class: °o Slowly increase the amount of protein shake you drink as tolerated °o You may find it easier to slowly sip shakes throughout the day °o It is important to get your proteins in first °• Your fluid goal is to drink 64 - 100 ounces of fluid daily °o It may take a few weeks to build up to this °• 32 oz (or more) should be clear liquids  °And  °• 32 oz (or more) should be full liquids (see below for examples) °• Liquids should not contain sugar, caffeine, or carbonation ° °Clear Liquids: °• Water or Sugar-free flavored water (i.e. Fruit H2O, Propel) °• Decaffeinated coffee or tea (sugar-free) °• Crystal Lite, Wyler’s Lite,  Minute Maid Lite °• Sugar-free Jell-O °• Bouillon or broth °• Sugar-free Popsicle:   *Less than 20 calories each; Limit 1 per day ° °Full Liquids: °Protein Shakes/Drinks + 2 choices per day of other full liquids °• Full liquids must be: °o No More Than 15 grams of Carbs per serving  °o No More Than 3 grams of Fat per serving °• Strained low-fat cream soup (except Cream of Potato or Tomato) °• Non-Fat milk °• Fat-free Lactaid Milk °• Unsweetened Soy Or Unsweetened Almond Milk °• Low Sugar yogurt (Dannon Lite & Fit, Greek yogurt; Oikos Triple Zero; Chobani Simply 100; Yoplait 100 calorie Greek - No Fruit on the Bottom) ° °  °Vitamins   and Minerals • Start 1 day after surgery unless otherwise directed by your surgeon °• 2 Chewable Bariatric Specific Multivitamin / Multimineral Supplement with iron (Example: Bariatric Advantage Multi EA) °• Chewable Calcium with Vitamin D-3 °(Example: 3 Chewable Calcium Plus 600 with Vitamin D-3) °o Take 500 mg three (3) times a day for a total of 1500 mg each day °o Do not take all 3 doses of calcium at one time as it may cause constipation, and you can only absorb 500 mg  at a time  °o Do not mix multivitamins containing iron with calcium supplements; take 2 hours apart °• Menstruating women and those with a history of anemia (a blood disease that causes weakness) may need extra iron °o Talk with your doctor to see if you need more iron °• Do not stop taking or change any vitamins or minerals until you talk to your dietitian or surgeon °• Your Dietitian and/or surgeon must approve all vitamin and mineral supplements °  °Activity and Exercise: Limit your physical activity as instructed by your doctor.  It is important to continue walking at home.  During this time, use these guidelines: °• Do not lift anything greater than ten (10) pounds for at least two (2) weeks °• Do not go back to work or drive until your surgeon says you can °• You may have sex when you feel comfortable  °o It is  VERY important for female patients to use a reliable birth control method; fertility often increases after surgery  °o All hormonal birth control will be ineffective for 30 days after surgery due to medications given during surgery a barrier method must be used. °o Do not get pregnant for at least 18 months °• Start exercising as soon as your doctor tells you that you can °o Make sure your doctor approves any physical activity °• Start with a simple walking program °• Walk 5-15 minutes each day, 7 days per week.  °• Slowly increase until you are walking 30-45 minutes per day °Consider joining our BELT program. [phone] or email [email or web address] °  °Special Instructions Things to remember: °• Use your CPAP when sleeping if this applies to you ° °• [facility] has two free Bariatric Surgery Support Groups that meet monthly °o The 3rd Thursday of each month, 6 pm, [facility]  °o The 2nd Friday of each month, 11:45 am in the private dining room in the basement of [facility] °• It is very important to keep all follow up appointments with your surgeon, dietitian, primary care physician, and behavioral health practitioner °• Routine follow up schedule with your surgeon include appointments at 2-3 weeks, 6-8 weeks, 6 months, and 1 year at a minimum.  Your surgeon may request to see you more often.   °o After the first year, please follow up with your bariatric surgeon and dietitian at least once a year in order to maintain best weight loss results °[facility]: [phone] °[facility]: [phone] °Bariatric Nurse Coordinator: [phone] °  °   Reviewed and Endorsed  °by [facility] Patient Education Committee, June, 2016 °Edits Approved: Aug, 2018 ° ° ° °

## 2019-01-05 NOTE — Op Note (Signed)
Operative Note  Mary Brandt  [id number]  [id number]  01/05/2019   Surgeon: Victorino Sparrow ConnorMD FACS  Assistant: Kaylyn Lim MD FACS  Procedure performed: laparoscopic sleeve gastrectomy, upper endoscopy  Preop diagnosis: Morbid obesity Body mass index is 63.83 kg/m., Obstructive sleep apnea on CPAP, hyperlipidemia, hyperglycemia, asthma, anxiety, GERD Post-op diagnosis/intraop findings: same  Specimens: fundus Retained items: none EBL: minimal cc Complications: none  Description of procedure: After obtaining informed consent and administration of prophylactic lovenox in holding, the patient was taken to the operating room and placed supine on operating room table wheregeneral endotracheal anesthesia was initiated, preoperative antibiotics were administered, SCDs applied, and a formal timeout was performed. The abdomen was prepped and draped in usual sterile fashion. Peritoneal access was gained using a Visiport technique in the left upper quadrant and insufflation to 15 mmHg ensued without issue. Gross inspection revealed no evidence of injury or other intraabdominal abnormality. Under direct visualization three more 5 mm trochars were placed in the right and left hemiabdomen and the 56mm trocar in the right paramedian upper abdomen. Bilateral laparoscopic assisted TAPS blocks were performed with Exparel diluted with 0.25 percent Marcaine with epinephrine. The patient was placed in steep Trendelenburg and the liver retractor was introduced through an incision in the upper midline and secured to the post externally to maintain the left lobe retracted anteriorly.   There was no hiatal hernia. Using the Harmonic scalpel, the greater curvature of the stomach was dissected away from the greater omentum and short gastric vessels were divided. This began 6 cm from the pylorus, and dissection proceeded until the left crus was clearly exposed. There were some filmy adhesions of the posterior  stomach to the pancreas which were taken down the Harmonic. Esophageal fat pad was mobilized off the anterior stomach slightly. The 52 [place] VisiGi was then introduced and directed down towards the pylorus. This was placed to suction against the lesser curve. Serial fires of the linear cutting stapler with seamguards were then employed to create our sleeve. The first fire used a green load and ensured adequate room at the angularis incisura. One gold load and then 3 blue loads were then employed to create a narrow tubular stomach up to the angle of His. The excised stomach was then removed through our 15 mm trocar site within an Endo Catch bag. The visigi was taken off of suction and a few puffs of air were introduced, inflating the sleeve. No bubbles were observed and the irrigation fluid around the stomach and the shape was noted to be a nice smooth tube without any narrowing at the angularis. The visigi was then removed. Two areas of oozing on the staple line were prophylactically clipped.  Upper endoscopy was performed by the assistant surgeon and the sleeve was noted to be airtight, the staple line was hemostatic and the lumen smoothly patent. Please see his separate note. The endoscope was removed. The 15 mm trocar site fascia in the right upper abdomen was closed with a 0 Vicryl using the laparoscopic suture passer under direct visualization. The liver retractor was removed under direct visualization. The abdomen was then desufflated and all remaining trochars removed. The skin incisions were closed with subcuticular Monocryl; benzoin, Steri-Strips and Band-Aids were applied The patient was then awakened, extubated and taken to PACU in stable condition.    All counts were correct at the completion of the case.

## 2019-01-05 NOTE — H&P (Signed)
Surgical H&P  CC: morbid obesity  HPI: Presents for follow up of morbid obesity. Has completed the preoperative pathway with no barriers identified. Although she's had no major changes to her health, she has struggledsignificantly over the last few months, secondary to the global pandemic/ working from home regained some weight.  She is up to 445 pounds today. She has cut out all beverages, but is still consuming sweetened beverages like lemonade and juice. She udnerstands she needs to work on this. She is working on managing her mind a little more as well.   CXR/UGI 07/14/18: negative, no hiatal hernia Dietician: approved, still has behavioral issues to work on [place]; (3/10 Gaylan Gerold) Psychologist: Clint Guy [facility] 09/13/18 approved Labs 07/22/18 no significant abnormalities   Initial consultation 06/20/18: Weight 420lb/ BMI 63.86. 32yo woman presents for consultation regarding bariatric surgery. She has been struggling with her weight since childhood/early teen years. She states that her mother, while not overweight, was constantly dieting and had body image issues. Her father is overweight, he has health issues secondary to smoking and diabetes. Her sister has had weight loss surgery about 6 months ago and has had many benefits from that. She has been thinking about bariatric surgery for quite some time off and on but has felt like she should be able to accomplish this on her own. She has tried many different types of diets, hCG injections, exercise programs. She notes that with each of these she may lose some weight but when she stops during the program she regains it.  Her weight has been steadily increasing, recently more so because of the change in job too much more sedentary one. She states that she and her husband cook frequently and rarely eat out, but that she has a significant issue with emotional eating, snacking, sugary drinks etc. She has met with the dietitian back  in November, see the note from November 8, she has a lot of behavioral obstacles.   Medical history: Obstructive sleep apnea on CPAP, hyperlipidemia, hyperglycemia, asthma, anxiety, GERD which is well controlled with low-dose daily PPI Surgical history: Wisdom teeth, mole removal, implanon, repair of finger laceration Family history: Atrial fibrillation and her mother, diabetes and cancer in her father, polycystic ovary syndrome/depression/anxiety/bipolar disorder in her sister, dementia and skin cancer in her grandparents, notes that her mother and sister both have postoperative nausea and vomiting and she is worried about how she will handle general anesthesia.  Social history: Alcohol rarely, denies drug use or tobacco use. Works as a Herbalist at [employer]. Married, no children, they have 5 dogs.  Medications: CYmbalta, ibuprofen, iron, mirena, MVI, prilosec 35m po daily  HgA1c was 6.2 (05/22/18).  She is a good candidate for sleeve gastrectomy. We discussed the surgery including technical aspects, the risks of bleeding, infection, pain, scarring, injury to intra-abdominal structures, staple line leak or abscess, chronic abdominal pain or nausea, new onset or worsened GERD, DVT/PE, pneumonia, heart attack, stroke, death, failure to reach weight loss goals and weight regain, hernia. Discussed the typical pre-, peri-, and postoperative course. Discussed the importance of lifelong behavioral changes to combat the chronic and relapsing disease which is obesity. I thoroughly emphasized that this will not make emotional eating any easier, that habit change has to be permanent, and in fact many people with depression or anxiety struggle with worsening symptoms when the coping mechanism of food is removed. She is someone that I would encourage to maintain long-term follow-up with her  psychologist postoperatively and continue meeting with the nutritionist long-term.  No  Known Allergies      Past Medical History:  Diagnosis Date  . Anxiety   . Asthma   . Hyperglycemia   . Hyperlipemia   . Morbid obesity ([place])    sees weight loss clinic  . Nevus    sees dermatologist, Dr. Tonia Brooms  . Obstructive sleep apnea on CPAP          Past Surgical History:  Procedure Laterality Date  . finger laceration Right 2008   2nd finger  . implanon insertion  06-26-11   left upper arm  . MOLE REMOVAL    . OTHER SURGICAL HISTORY Left [id number]    Implanon  . WISDOM TOOTH EXTRACTION           Family History  Problem Relation Age of Onset  . Atrial fibrillation Mother   . Diabetes Father   . Cancer Father        skin  . Polycystic ovary syndrome Sister   . Depression Sister   . Anxiety disorder Sister   . Bipolar disorder Sister   . Cancer Maternal Grandfather        skin  . Dementia Maternal Grandmother     Social History        Socioeconomic History  . Marital status: Married    Spouse name: Mortimer Fries  . Number of children: 0  . Years of education: 34  . Highest education level: Not on file  Occupational History  . Occupation: LEGAL ASSISTANT   . Occupation: Conservator, museum/gallery: [employer]  Social Needs  . Financial resource strain: Not on file  . Food insecurity    Worry: Not on file    Inability: Not on file  . Transportation needs    Medical: Not on file    Non-medical: Not on file  Tobacco Use  . Smoking status: Never Smoker  . Smokeless tobacco: Never Used  Substance and Sexual Activity  . Alcohol use: Yes    Comment: few a month  . Drug use: No  . Sexual activity: Yes    Partners: Male    Birth control/protection: I.U.D.  Lifestyle  . Physical activity    Days per week: Not on file    Minutes per session: Not on file  . Stress: Not on file  Relationships  . Social Herbalist on phone: Not on file    Gets together: Not on file    Attends  religious service: Not on file    Active member of club or organization: Not on file    Attends meetings of clubs or organizations: Not on file    Relationship status: Not on file  Other Topics Concern  . Not on file  Social History Narrative      Marital Status: Married Chief Operating Officer)    Children:  None    Pets:  5 dogs   Living Situation: Lives with his husband.   Occupation: Civil Service fast streamer     Education: Forensic psychologist ([place]) [place])    Alcohol Use:  Occasional   Tobacco Use:  None    Drug Use:  None   Diet:  Regular   Exercise:  None   Hobbies:  Reading, Movies                    Current Outpatient Medications on File Prior to Visit  Medication Sig Dispense Refill  . DULoxetine (CYMBALTA) 60 MG capsule Take 1 capsule (60 mg total) by mouth daily. 90 capsule 1  . ibuprofen (ADVIL,MOTRIN) 200 MG tablet Take 3 tablets (600 mg total) by mouth every 6 (six) hours as needed for moderate pain. 30 tablet 0  . IRON PO Take by mouth.    . levonorgestrel (MIRENA) 20 MCG/24HR IUD 1 each by Intrauterine route once.    . Multiple Vitamins-Minerals (MULTIVITAMIN ADULT PO) Take by mouth.    Marland Kitchen omeprazole (PRILOSEC) 20 MG capsule Take 1 capsule (20 mg total) by mouth daily. 90 capsule 1  . triamcinolone cream (KENALOG) 0.1 % Apply 1 application topically 2 (two) times daily. 30 g 0   No current facility-administered medications on file prior to visit.     Review of Systems: a complete, 10pt review of systems was completed with pertinent positives and negatives as documented in the HPI  Physical Exam: Gen: alert and well appearing Eye: extraocular motion intact, no scleral icterus ENT: moist mucus membranes, dentition intact Neck: no mass or thyromegaly Chest: unlabored respirations, symmetrical air entry, clear bilaterally CV: regular rate and rhythm, no pedal edema Abdomen: soft, nontender, nondistended. No mass or  organomegaly MSK: strength symmetrical throughout, no deformity Neuro: grossly intact, normal gait Psych: normal mood and affect, appropriate insight Skin: warm and dry, no rash or lesion on limited exam    CBC Latest Ref Rng & Units 06/03/2017 10/08/2016 05/10/2016  WBC 4.0 - 10.5 K/uL 8.3 14.0(H) 10.2  Hemoglobin 12.0 - 15.0 g/dL 13.7 12.4 11.4(L)  Hematocrit 36.0 - 46.0 % 42.0 38.8 35.9(L)  Platelets 150.0 - 400.0 K/uL 356.0 414.0(H) 400.0    CMP Latest Ref Rng & Units 10/08/2016 01/26/2014 10/05/2013  Glucose 70 - 99 mg/dL 80 89 99  BUN 6 - 23 mg/dL _0 Creatinine 0.40 - 1.20 mg/dL 0.90 0.9 0.89  Sodium 135 - 145 mEq/L 141 136 137  Potassium 3.5 - 5.1 mEq/L 3.8 3.7 4.2  Chloride 96 - 112 mEq/L 105 104 102  CO2 19 - 32 mEq/L _1 Calcium 8.4 - 10.5 mg/dL 9.2 8.7 9.4  Total Protein 6.0 - 8.3 g/dL 6.9 7.8 6.9  Total Bilirubin 0.2 - 1.2 mg/dL 0.4 0.4 0.2  Alkaline Phos 39 - 117 U/L 86 94 101  AST 0 - 37 U/L _2 ALT 0 - 35 U/L _3 Recent Labs  No results found for: INR, PROTIME    Imaging: Imaging Results (Last 48 hours)  No results found.      A/P: MORBID OBESITY (E66.01) Story: Her disease is escalating. We had a long discussion today about ongoing need for behavioral changes, she has made some strides in the right direction which is encouraging. Understandable that she has had some lapse secondary to the stress of the pandemic, and she understands the importance of lifelong behavioral change. Plan to proceed with sleeve gastrectomy, pending insurance approval. We have previously discussed the surgery in detail and she understands the risks.   Romana Juniper, MD [facility], [place] Pager [phone]

## 2019-01-05 NOTE — Transfer of Care (Signed)
Immediate Anesthesia Transfer of Care Note  Patient: Mary Brandt  Procedure(s) Performed: LAPAROSCOPIC GASTRIC SLEEVE RESECTION, UPPER ENDO, ERAS Pathway (N/A Abdomen)  Patient Location: PACU  Anesthesia Type:General  Level of Consciousness: awake, alert  and oriented  Airway & Oxygen Therapy: Patient Spontanous Breathing and Patient connected to face mask oxygen  Post-op Assessment: Report given to RN and Post -op Vital signs reviewed and stable  Post vital signs: Reviewed and stable  Last Vitals:  Vitals Value Taken Time  BP 145/93 01/05/19 1310  Temp    Pulse 117 01/05/19 1313  Resp 18 01/05/19 1313  SpO2 100 % 01/05/19 1313  Vitals shown include unvalidated device data.  Last Pain:  Vitals:   01/05/19 0959  TempSrc:   PainSc: 0-No pain         Complications: No apparent anesthesia complications

## 2019-01-05 NOTE — Anesthesia Procedure Notes (Signed)
Procedure Name: Intubation Date/Time: 01/05/2019 11:17 AM Performed by: Maxwell Caul, CRNA Pre-anesthesia Checklist: Patient identified, Emergency Drugs available, Suction available and Patient being monitored Patient Re-evaluated:Patient Re-evaluated prior to induction Oxygen Delivery Method: Circle system utilized Preoxygenation: Pre-oxygenation with 100% oxygen Induction Type: IV induction Laryngoscope Size: Mac and 4 Grade View: Grade II Tube type: Oral Tube size: 7.5 mm Number of attempts: 1 Airway Equipment and Method: Stylet Placement Confirmation: ETT inserted through vocal cords under direct vision,  positive ETCO2 and breath sounds checked- equal and bilateral Secured at: 21 cm Tube secured with: Tape Dental Injury: Teeth and Oropharynx as per pre-operative assessment

## 2019-01-05 NOTE — Progress Notes (Signed)
Vomited 10-15cc of pink tinged phlegm, Reglan given as ordered.

## 2019-01-05 NOTE — Op Note (Signed)
BONI DOWNIE [id number]:[id number] 1986-04-07 01/05/2019  Preoperative diagnosis: morbid obesity for sleeve gastrectomy  Postoperative diagnosis: Same   Procedure: Upper endoscopy   Surgeon: Catalina Antigua B. Hassell Done  M.D., FACS   Anesthesia: Gen.   Indications for procedure: This patient was undergoing a sleeve gastrectomy by Dr. Kae Heller.    Description of procedure: The endoscopy was placed in the mouth and into the oropharynx and under endoscopic vision it was advanced to the esophagogastric junction.  The pouch was insufflated and the sleeve was cylindrical.  EG junction was at 39 cm.  Antrum appropriately sized..   No bleeding or leaks were detected.  The scope was withdrawn without difficulty.     Matt B. Hassell Done, MD, FACS General, Bariatric, & Minimally Invasive Surgery [facility] Surgery, [place]

## 2019-01-05 NOTE — Anesthesia Postprocedure Evaluation (Signed)
Anesthesia Post Note  Patient: Mary Brandt  Procedure(s) Performed: LAPAROSCOPIC GASTRIC SLEEVE RESECTION, UPPER ENDO, ERAS Pathway (N/A Abdomen)     Patient location during evaluation: PACU Anesthesia Type: General Level of consciousness: awake and alert Pain management: pain level controlled Vital Signs Assessment: post-procedure vital signs reviewed and stable Respiratory status: spontaneous breathing, nonlabored ventilation, respiratory function stable and patient connected to nasal cannula oxygen Cardiovascular status: blood pressure returned to baseline and stable Postop Assessment: no apparent nausea or vomiting Anesthetic complications: no    Last Vitals:  Vitals:   01/05/19 2006 01/05/19 2124  BP:  124/72  Pulse: 88 92  Resp: 16 20  Temp:  (!) 36.4 C  SpO2: 94% 98%    Last Pain:  Vitals:   01/05/19 2124  TempSrc: Oral  PainSc:                  [place]

## 2019-01-05 NOTE — Progress Notes (Signed)
Discussed post op day goals with patient including ambulation, IS, diet progression, pain, and nausea control.  BSTOP education provided including BSTOP information guide, "Guide for Pain Management after your Bariatric Procedure".  Questions answered. 

## 2019-01-05 NOTE — Progress Notes (Signed)
PHARMACY CONSULT FOR:  Risk Assessment for Post-Discharge VTE Following Bariatric Surgery  Post-Discharge VTE Risk Assessment: This patient's probability of 30-day post-discharge VTE is increased due to the factors marked:   Female    Age >/=60 years   X  BMI >/=50 kg/m2    CHF    Dyspnea at Rest    Paraplegia  X Non-gastric-band surgery    Operation Time >/=3 hr    Return to OR     Length of Stay >/= 3 d   Predicted probability of 30-day post-discharge VTE: 0.27%  Other patient-specific factors to consider: no PTA anticoag or Hx thromboembolism   Recommendation for Discharge: . No pharmacologic prophylaxis post-discharge . Follow daily and recalculate estimated 30d VTE risk if returns to OR or LOS reaches 3 days.   Mary Brandt is a 33 y.o. female who underwent laparoscopic sleeve gastrectomy on 8/31   Case start: 1144 Case end: 1255   No Known Allergies  Patient Measurements: Height: 5\' 8"  (172.7 cm) Weight: (!) 419 lb 12.8 oz (190.4 kg) IBW/kg (Calculated) : 63.9 Body mass index is 63.83 kg/m.  No results for input(s): WBC, HGB, HCT, PLT, APTT, CREATININE, LABCREA, CREATININE, CREAT24HRUR, MG, PHOS, ALBUMIN, PROT, ALBUMIN, AST, ALT, ALKPHOS, BILITOT, BILIDIR, IBILI in the last 72 hours. Estimated Creatinine Clearance: 179.1 mL/min (by C-G formula based on SCr of 0.8 mg/dL).  Past Medical History:  Diagnosis Date  . Anxiety   . Asthma   . Hyperglycemia   . Hyperlipemia   . Morbid obesity ([place])    sees weight loss clinic  . Nevus    sees dermatologist, Dr. Tonia Brooms  . Obstructive sleep apnea on CPAP     Medications Prior to Admission  Medication Sig Dispense Refill Last Dose  . DULoxetine (CYMBALTA) 60 MG capsule Take 1 capsule (60 mg total) by mouth daily. 90 capsule 1 01/04/2019 at Unknown time  . IRON PO Take 1 tablet by mouth daily.    01/04/2019 at Unknown time  . levonorgestrel (MIRENA) 20 MCG/24HR IUD 1 each by Intrauterine route once.     . Multiple  Vitamins-Minerals (MULTIVITAMIN ADULT PO) Take 1 tablet by mouth daily.    01/04/2019 at Unknown time  . omeprazole (PRILOSEC) 20 MG capsule Take 1 capsule (20 mg total) by mouth daily. 90 capsule 1 01/04/2019 at Unknown time    Reuel Boom, PharmD, BCPS [phone] 01/05/2019, 4:17 PM

## 2019-01-06 ENCOUNTER — Encounter (HOSPITAL_COMMUNITY): Payer: Self-pay | Admitting: Surgery

## 2019-01-06 LAB — CBC WITH DIFFERENTIAL/PLATELET
Abs Immature Granulocytes: 0.07 10*3/uL (ref 0.00–0.07)
Basophils Absolute: 0 10*3/uL (ref 0.0–0.1)
Basophils Relative: 0 %
Eosinophils Absolute: 0 10*3/uL (ref 0.0–0.5)
Eosinophils Relative: 0 %
HCT: 42 % (ref 36.0–46.0)
Hemoglobin: 12.7 g/dL (ref 12.0–15.0)
Immature Granulocytes: 1 %
Lymphocytes Relative: 19 %
Lymphs Abs: 2.2 10*3/uL (ref 0.7–4.0)
MCH: 27.1 pg (ref 26.0–34.0)
MCHC: 30.2 g/dL (ref 30.0–36.0)
MCV: 89.6 fL (ref 80.0–100.0)
Monocytes Absolute: 0.8 10*3/uL (ref 0.1–1.0)
Monocytes Relative: 7 %
Neutro Abs: 8.4 10*3/uL — ABNORMAL HIGH (ref 1.7–7.7)
Neutrophils Relative %: 73 %
Platelets: 329 10*3/uL (ref 150–400)
RBC: 4.69 MIL/uL (ref 3.87–5.11)
RDW: 16 % — ABNORMAL HIGH (ref 11.5–15.5)
WBC: 11.6 10*3/uL — ABNORMAL HIGH (ref 4.0–10.5)
nRBC: 0 % (ref 0.0–0.2)

## 2019-01-06 LAB — COMPREHENSIVE METABOLIC PANEL
ALT: 36 U/L (ref 0–44)
AST: 20 U/L (ref 15–41)
Albumin: 3.7 g/dL (ref 3.5–5.0)
Alkaline Phosphatase: 80 U/L (ref 38–126)
Anion gap: 12 (ref 5–15)
BUN: 13 mg/dL (ref 6–20)
CO2: 24 mmol/L (ref 22–32)
Calcium: 8.7 mg/dL — ABNORMAL LOW (ref 8.9–10.3)
Chloride: 103 mmol/L (ref 98–111)
Creatinine, Ser: 0.72 mg/dL (ref 0.44–1.00)
GFR calc Af Amer: >60 mL/min (ref >60)
GFR calc non Af Amer: >60 mL/min (ref >60)
Glucose, Bld: 115 mg/dL — ABNORMAL HIGH (ref 70–99)
Potassium: 4 mmol/L (ref 3.5–5.1)
Sodium: 139 mmol/L (ref 135–145)
Total Bilirubin: 0.5 mg/dL (ref 0.3–1.2)
Total Protein: 7.2 g/dL (ref 6.5–8.1)

## 2019-01-06 LAB — MAGNESIUM: Magnesium: 2.3 mg/dL (ref 1.7–2.4)

## 2019-01-06 MED ORDER — GABAPENTIN 300 MG PO CAPS: 300.0000 mg | ORAL_CAPSULE | Freq: Three times a day (TID) | ORAL | 0 refills | Status: DC

## 2019-01-06 MED ORDER — DOCUSATE SODIUM 100 MG PO CAPS: 100.0000 mg | ORAL_CAPSULE | Freq: Two times a day (BID) | ORAL | 3 refills | Status: DC

## 2019-01-06 MED ORDER — ACETAMINOPHEN 500 MG PO TABS: 1000.0000 mg | ORAL_TABLET | Freq: Three times a day (TID) | ORAL | 0 refills | Status: DC

## 2019-01-06 MED ORDER — PANTOPRAZOLE SODIUM 40 MG PO TBEC: 40.0000 mg | DELAYED_RELEASE_TABLET | Freq: Every day | ORAL | 3 refills | Status: DC

## 2019-01-06 MED ORDER — ONDANSETRON 4 MG PO TBDP: 4.0000 mg | ORAL_TABLET | Freq: Four times a day (QID) | ORAL | 0 refills | Status: DC | PRN

## 2019-01-06 MED ORDER — TRAMADOL HCL 50 MG PO TABS: 50.0000 mg | ORAL_TABLET | Freq: Four times a day (QID) | ORAL | 0 refills | Status: DC | PRN

## 2019-01-06 MED FILL — PANTOPRAZOLE SOD DR 40 MG T: 40 | 90 days supply | Qty: 90 | Fill #0

## 2019-01-06 MED FILL — ONDANSETRON ODT 4 MG TABLET: 4 | 5 days supply | Qty: 20 | Fill #0

## 2019-01-06 MED FILL — traMADol HCL 50 MG TABS: 50 | 4 days supply | Qty: 15 | Fill #0

## 2019-01-06 MED FILL — GABAPENTIN 300 MG CAPSULE: 300 | 5 days supply | Qty: 15 | Fill #0

## 2019-01-06 NOTE — Discharge Summary (Signed)
Physician Discharge Summary  Mary Brandt TXM:[id number] DOB: [birth year] DOA: 01/05/2019  PCP: Lucretia Kern, DO  Admit date: 01/05/2019 Discharge date: 01/06/2019 01/06/2019   Recommendations for Outpatient Follow-up:   Follow-up Information    Clovis Riley, MD. Go on 01/22/2019.   Specialty: General Surgery Why: at 1130 am Contact information: [address] [place] [address] [phone]        Clovis Riley, MD .   Specialty: General Surgery Contact information: [address] [place] [address] [phone]          Discharge Diagnoses:  Active Problems:   Morbid obesity ([place])   Surgical Procedure: Laparoscopic Sleeve Gastrectomy, upper endoscopy  Discharge Condition: Good Disposition: Home  Diet recommendation: Postoperative sleeve gastrectomy diet (liquids only)  Filed Weights   01/05/19 0925  Weight: (!) 190.4 kg     Hospital Course:  The patient was admitted for a planned laparoscopic sleeve gastrectomy. Please see operative note. Preoperatively the patient was given lovenox for DVT prophylaxis. Postoperative prophylactic Lovenox dosing was started on the evening of postoperative day 0. ERAS protocol was used. On the evening of postoperative day 0, the patient was started on water and ice chips. On postoperative day 1 the patient had no fever or tachycardia and was tolerating water in their diet was gradually advanced throughout the day. The patient was ambulating without difficulty. Their vital signs are stable without fever or tachycardia. Their hemoglobin had remained stable. The patient was maintained on their home settings for CPAP therapy. The patient had received discharge instructions and counseling. They were deemed stable for discharge and had met discharge criteria   Discharge Instructions  Discharge Instructions    Ambulate hourly while awake   Complete by: As directed    Call MD for:   difficulty breathing, headache or visual disturbances   Complete by: As directed    Call MD for:  persistant dizziness or light-headedness   Complete by: As directed    Call MD for:  persistant nausea and vomiting   Complete by: As directed    Call MD for:  redness, tenderness, or signs of infection (pain, swelling, redness, odor or green/yellow discharge around incision site)   Complete by: As directed    Call MD for:  severe uncontrolled pain   Complete by: As directed    Call MD for:  temperature >101 F   Complete by: As directed    Incentive spirometry   Complete by: As directed    Perform hourly while awake     Allergies as of 01/06/2019   No Known Allergies     Medication List    TAKE these medications   acetaminophen 500 MG tablet Commonly known as: TYLENOL Take 2 tablets (1,000 mg total) by mouth every 8 (eight) hours. Take this scheduled for the first 3-4 days after you get home, then take if needed   docusate sodium 100 MG capsule Commonly known as: COLACE Take 1 capsule (100 mg total) by mouth 2 (two) times daily.   DULoxetine 60 MG capsule Commonly known as: CYMBALTA Take 1 capsule (60 mg total) by mouth daily.   gabapentin 300 MG capsule Commonly known as: NEURONTIN Take 1 capsule (300 mg total) by mouth 3 (three) times daily.   IRON PO Take 1 tablet by mouth daily.   levonorgestrel 20 MCG/24HR IUD Commonly known as: MIRENA 1 each by Intrauterine route once.   MULTIVITAMIN ADULT PO Take  1 tablet by mouth daily.   omeprazole 20 MG capsule Commonly known as: PRILOSEC Take 1 capsule (20 mg total) by mouth daily.   ondansetron 4 MG disintegrating tablet Commonly known as: ZOFRAN-ODT Take 1 tablet (4 mg total) by mouth every 6 (six) hours as needed for nausea or vomiting.   pantoprazole 40 MG tablet Commonly known as: PROTONIX Take 1 tablet (40 mg total) by mouth daily.   traMADol 50 MG tablet Commonly known as: ULTRAM Take 1 tablet (50 mg total)  by mouth every 6 (six) hours as needed (pain not relieved by tylenol or gabapentin).      Follow-up Information    Clovis Riley, MD. Go on 01/22/2019.   Specialty: General Surgery Why: at 1130 am Contact information: [address] [place] [address] [phone]        Clovis Riley, MD .   Specialty: General Surgery Contact information: [address] [place] [address] [phone]            The results of significant diagnostics from this hospitalization (including imaging, microbiology, ancillary and laboratory) are listed below for reference.    Significant Diagnostic Studies: No results found.  Labs: Basic Metabolic Panel: Recent Labs  Lab 01/01/19 0830 01/06/19 0408  NA 140 139  K 4.1 4.0  CL 104 103  CO2 26 24  GLUCOSE 96 115*  BUN 19 13  CREATININE 0.80 0.72  CALCIUM 9.0 8.7*  MG  --  2.3   Liver Function Tests: Recent Labs  Lab 01/01/19 0830 01/06/19 0408  AST 22 20  ALT 35 36  ALKPHOS 83 80  BILITOT 0.5 0.5  PROT 7.5 7.2  ALBUMIN 3.9 3.7    CBC: Recent Labs  Lab 01/01/19 0830 01/06/19 0408  WBC 7.7 11.6*  NEUTROABS 4.3 8.4*  HGB 13.7 12.7  HCT 45.4 42.0  MCV 88.3 89.6  PLT 345 329    CBG: No results for input(s): GLUCAP in the last 168 hours.  Active Problems:   Morbid obesity ([place])   Signed:  Clovis Riley, [facility], [place] 01/06/2019, 11:42 AM

## 2019-01-06 NOTE — Progress Notes (Signed)
S: Slept well. Tolerating liquids with minimal nausea. No dysphagia. Small volume emesis yesterday evening. Walking well, pain is well controlled.   Vitals, labs, intake/output, and orders reviewed at this time.  Gen: A&Ox3, no distress  H&N: EOMI, atraumatic, neck supple Chest: unlabored respirations, RRR Abd: soft, minimally appropriately tender, nondistended, incisions c/d/i with some old blood at left lateral site, no cellulitis Ext: warm, no edema Neuro: grossly normal  Lines/tubes/drains: PIV  A/P:  POD 1 sleeve gastrectomy Continue liquids Continue ambulating, SCDs/ Lovenox, pulm toilet Plan discharge later today   Romana Juniper, MD [facility], [place] Pager [phone]

## 2019-01-06 NOTE — Progress Notes (Signed)
Nutrition Note  RD consulted for diet education for patient s/p bariatric surgery. While RDs are working remotely, Insurance risk surveyor providing education.   If nutrition issues arise, please consult RD.   Clayton Bibles, MS, RD, [place] Dietitian Pager: [phone] After Hours Pager: [phone]

## 2019-01-06 NOTE — Progress Notes (Signed)
Patient alert and oriented, pain is controlled. Patient is tolerating fluids, advanced to protein shake today, patient is tolerating well.  Reviewed Gastric sleeve discharge instructions with patient and patient is able to articulate understanding.  Provided information on BELT program, Support Group and [facility] outpatient pharmacy. All questions answered, will continue to monitor.   Total fluid intake 600

## 2019-01-06 NOTE — Progress Notes (Signed)
Discharge instructions given to patient. Patient had no questions. NT or writer will wheel patient out once her family comes in to pick her up

## 2019-01-13 ENCOUNTER — Telehealth (HOSPITAL_COMMUNITY): Payer: Self-pay

## 2019-01-13 NOTE — Telephone Encounter (Addendum)
Patient called to discuss post bariatric surgery follow up questions, no answer message left for return call.  Patient returned call to answer below questions at 1040 am 01/13/2019 See below answers:   1.  Tell me about your pain and pain management?denies  2.  Let's talk about fluid intake.  How much total fluid are you taking in?not measuring intake but states she is drinking all day getting in protein, soups, and water and feels she is meeting her needs.  Denies s/s of dehydration including dizziness, headache, dark urine or low urine output  3.  How much protein have you taken in the last 2 days?60 grams of protein or more  4.  Have you had nausea?  Tell me about when have experienced nausea and what you did to help?denies  5.  Has the frequency or color changed with your urine?urinating fine color light  6.  Tell me what your incisions look like? One incision open per patient.  Sent picture to [facility] via [place] e-mail copied to this note, advised to continue to monitor site for increased redness around circumference of incision or drainage.  [facility] contact information provided.    7.  Have you been passing gas? BM?passing gas, had several bms denies constipation  8.  If a problem or question were to arise who would you call?  Do you know contact numbers for [place], [facility], and [facility]?aware of how to contact all services  9.  How has the walking going?walking regularly with no difficulties  10.  How are your vitamins and calcium going?  How are you taking them?mvi started and did not like that mvi, switched to [employer] 45 one a day and tolerates this product much better and calcium x3 per day without difficulty

## 2019-01-20 ENCOUNTER — Encounter: Payer: 59 | Attending: Surgery | Admitting: Dietician

## 2019-01-20 ENCOUNTER — Encounter: Payer: Self-pay | Admitting: Dietician

## 2019-01-20 ENCOUNTER — Other Ambulatory Visit: Payer: Self-pay

## 2019-01-20 NOTE — Progress Notes (Signed)
Bariatric Class  Start Time: 3:30pm   End Time: 4:05pm  2 Week Post-Operative Nutrition Class  Patient was seen on 01/20/2019 for post-operative nutrition education at [facility]   Surgery date: 01/05/2019 Surgery type: Sleeve Gastrectomy  Start weight at [facility]: 422 lbs (date: 07/15/2018) Weight today: 401.2 lbs Weight change: -21 lbs  Body Composition Scale 01/20/2019  Weight (lbs) 401.1  BMI 61.1  Total Body Fat % 52.2     Visceral Fat 21  Fat-Free Mass % 47.7     Total Body Water % 38.3     Muscle-Mass (lbs) 37.3  Body Fat Displacement ---         Torso  (lbs) 130.3         Left Leg  (lbs) 26         Right Leg  (lbs) 26         Left Arm  (lbs) 13         Right Arm  (lbs) 13   Fluids include water, soup, Premier Protein shakes, Propel, Gatorade Zero  Fluid intake: 60 oz/day   The following the learning objectives were met by the patient during this course:  Identifies Phase 3 (Soft, High Protein Foods) Dietary Goals and will begin from 2 weeks post-operatively to 2 months post-operatively  Identifies appropriate sources of fluids and proteins   States protein recommendations and appropriate sources post-operatively  Identifies the need for appropriate texture modifications, mastication, and bite sizes when consuming solids  Identifies appropriate multivitamin and calcium sources post-operatively  Describes the need for physical activity post-operatively and will follow MD recommendations  States when to call healthcare provider regarding medication questions or post-operative complications  Handouts given during class include:  Phase 3: Soft, High Protein Diet  Phase 3 Meal Ideas  Follow-Up Plan: Patient will follow-up at [facility] in 6 weeks for 2 month post-op nutrition visit for diet advancement per MD.

## 2019-01-26 ENCOUNTER — Telehealth: Payer: Self-pay | Admitting: Dietician

## 2019-01-26 NOTE — Telephone Encounter (Signed)
I spoke with patient via telephone to assess fluid intake and food tolerance since diet advancement to solid protein foods on 01/20/2019.  Surgery Date: 01/05/2019 Surgery Type: Sleeve Gastrectomy   Daily fluid intake: ~40 ounces Daily protein intake: 60 grams  Patient states she is having difficulty getting her daily fluids in, just because it is hard to remember to constantly sip all day long. Otherwise, tolerating all fluids and foods just fine. No questions or concerns per patient.     Nat Christen [place]) Abubakar Crispo, MS, RD, LDN

## 2019-02-26 ENCOUNTER — Encounter: Payer: Self-pay | Admitting: Family Medicine

## 2019-02-26 NOTE — Telephone Encounter (Signed)
I left a detailed message at the pts cell number stating she can schedule a virtual visit with Dr Maudie Mercury and she does have openings today if she would like to do so today.

## 2019-03-03 ENCOUNTER — Encounter: Payer: Self-pay | Admitting: Dietician

## 2019-03-03 ENCOUNTER — Telehealth (INDEPENDENT_AMBULATORY_CARE_PROVIDER_SITE_OTHER): Payer: 59 | Admitting: Family Medicine

## 2019-03-03 ENCOUNTER — Encounter: Payer: Self-pay | Admitting: Family Medicine

## 2019-03-03 ENCOUNTER — Other Ambulatory Visit: Payer: Self-pay

## 2019-03-03 ENCOUNTER — Encounter: Payer: 59 | Attending: Surgery | Admitting: Dietician

## 2019-03-03 VITALS — Wt 382.0 lb

## 2019-03-03 DIAGNOSIS — R739 Hyperglycemia, unspecified: Secondary | ICD-10-CM | POA: Diagnosis not present

## 2019-03-03 DIAGNOSIS — K219 Gastro-esophageal reflux disease without esophagitis: Secondary | ICD-10-CM

## 2019-03-03 DIAGNOSIS — F411 Generalized anxiety disorder: Secondary | ICD-10-CM

## 2019-03-03 DIAGNOSIS — G4733 Obstructive sleep apnea (adult) (pediatric): Secondary | ICD-10-CM

## 2019-03-03 DIAGNOSIS — E669 Obesity, unspecified: Secondary | ICD-10-CM

## 2019-03-03 NOTE — Patient Instructions (Signed)
-  please schedule a lab visit for you diabetes check  -please get you flu shot, let our office know if we can assist  -continue a healthy low sugar diet and regular exercise  -I sent cymbalta refills to your pharmacy  -follow up as scheduled, sooner as needed

## 2019-03-03 NOTE — Progress Notes (Signed)
Nutrition Follow-Up Visit for Bariatric Surgery Medical Nutrition Therapy  Appt Start Time: 11:00am   End Time: 11:20am  2 Months Post-Operative Sleeve Surgery Surgery Date: 01/05/2019  Pt's Expectations of Surgery/ Goals: to assist her with weight loss Non-Scale Victories: more energy to do things, less medications    NUTRITION ASSESSMENT  Anthropometrics  Start weight at [facility]: 422 lbs (date: 07/15/2018) Today's weight: 382.7 lbs Weight change: -18.4 lbs (since previous nutrition visit)  Body Composition Scale 01/20/2019 03/03/2019  Weight  lbs 401.1 382.7  BMI 61.1 58.3  Total Body Fat  % 52.2 51.4     Visceral Fat 21 20  Fat-Free Mass  % 47.7 48.5     Total Body Water  % 38.3 38.7     Muscle-Mass  lbs 37.3 37.1  Body Fat Displacement --- ---         Torso  lbs 130.3 122.4         Left Leg  lbs 26 24.4         Right Leg  lbs 26 24.4         Left Arm  lbs 13 12.2         Right Arm  lbs 13 12.2   Clinical  Medical hx: obesity, OSA, GERD, hyperglycemia, hyperlipidemia, anxiety, asthma  Medications: see list (NEW: switched to Prilosec from omeprazole)     Lifestyle & Dietary Hx States she has had no major issues or concerns. Only thing is having trouble meeting fluid goal every day. No problem meeting protein goal, does not drink protein shakes anymore. Likes snacking on parmesan crisps.    Estimated daily fluid intake: 40-50 oz Estimated daily protein intake: 60+ g Supplements: bariatric MVI, calcium  Current average weekly physical activity: walking (interested in [facility], email provided)    24-Hr Dietary Recall First Meal: eggs + sausage/bacon  Snack: -  Second Meal: lunch meat + cheese  Snack: parmesan crisps    Third Meal: chicken  Snack: - Beverages: water, Gatorade Zero   Post-Op Goals/ Signs/ Symptoms Using straws: no Drinking while eating: no Chewing/swallowing difficulties: no Changes in vision: no Changes to mood/headaches: no Hair loss/changes to  skin/nails: no Difficulty focusing/concentrating: no Sweating: no Dizziness/lightheadedness: no Palpitations: no  Carbonated/caffeinated beverages: no N/V/D/C/Gas: no Abdominal pain: no Dumping syndrome: no   NUTRITION DIAGNOSIS  Overweight/obesity ([place]-3.3) related to past poor dietary habits and physical inactivity as evidenced by completed bariatric surgery and following dietary guidelines for continued weight loss and healthy nutrition status.   NUTRITION INTERVENTION Nutrition counseling (C-1) and education (E-2) to facilitate bariatric surgery goals, including: . Diet advancement to the next phase (phase 4) now including non-starchy vegetables  . The importance of consuming adequate calories as well as certain nutrients daily due to the body's need for essential vitamins, minerals, and fats . The importance of daily physical activity and to reach a goal of at least 150 minutes of moderate to vigorous physical activity weekly (or as directed by their physician) due to benefits such as increased musculature and improved lab values  Handouts Provided Include   Phase 4: Protein + Non-Starchy Vegetables   Learning Style & Readiness for Change Teaching method utilized: Visual & Auditory  Demonstrated degree of understanding via: Teach Back  Barriers to learning/adherence to lifestyle change: None Identified    MONITORING & EVALUATION Dietary intake, weekly physical activity, body weight, and goals in 4 months.  Next Steps Patient is to follow-up in 4 months for 6 month  post-op follow-up.

## 2019-03-03 NOTE — Progress Notes (Signed)
Virtual Visit via Video Note  I connected with Mary Brandt  on 03/03/19 at  3:00 PM EDT by a video enabled telemedicine application and verified that I am speaking with the correct person using two identifiers.  Location patient: home Location provider:work or home office Persons participating in the virtual visit: patient, provider  I discussed the limitations of evaluation and management by telemedicine and the availability of in person appointments. The patient expressed understanding and agreed to proceed.   HPI:  Seen for follow up: -PMH Hyperglycemia, Morbid obesity, Anciety, GERD and OSA -445lbs before gastric bypass 2 months ago - down 60 lbs and reports feels great -has been sticking to the diet and working up to exercise -she is in the [place] program and will start exercise soon -wants to recheck Hgba1c (6.6 in August 2020) -needs refill on Cymbalta, reports mood has been great, denies anxiety or depression   ROS: See pertinent positives and negatives per HPI.  Past Medical History:  Diagnosis Date  . Anxiety   . Asthma   . Hyperglycemia   . Hyperlipemia   . Morbid obesity ([place])    sees weight loss clinic  . Nevus    sees dermatologist, Dr. Tonia Brooms  . Obstructive sleep apnea on CPAP     Past Surgical History:  Procedure Laterality Date  . finger laceration Right 2008   2nd finger  . implanon insertion  06-26-11   left upper arm  . LAPAROSCOPIC GASTRIC SLEEVE RESECTION N/A 01/05/2019   Procedure: LAPAROSCOPIC GASTRIC SLEEVE RESECTION, UPPER ENDO, ERAS Pathway;  Surgeon: Clovis Riley, MD;  Location: [facility];  Service: General;  Laterality: N/A;  . MOLE REMOVAL    . OTHER SURGICAL HISTORY Left [id number]    Implanon  . WISDOM TOOTH EXTRACTION      Family History  Problem Relation Age of Onset  . Atrial fibrillation Mother   . Diabetes Father   . Cancer Father        skin  . Polycystic ovary syndrome Sister   . Depression Sister   . Anxiety disorder Sister    . Bipolar disorder Sister   . Cancer Maternal Grandfather        skin  . Dementia Maternal Grandmother     SOCIAL HX: see hpi   Current Outpatient Medications:  .  acetaminophen (TYLENOL) 500 MG tablet, Take 2 tablets (1,000 mg total) by mouth every 8 (eight) hours. Take this scheduled for the first 3-4 days after you get home, then take if needed, Disp: 30 tablet, Rfl: 0 .  CALCIUM PO, Take 750 mg by mouth 3 (three) times daily., Disp: , Rfl:  .  DULoxetine (CYMBALTA) 60 MG capsule, Take 1 capsule (60 mg total) by mouth daily., Disp: 90 capsule, Rfl: 1 .  levonorgestrel (MIRENA) 20 MCG/24HR IUD, 1 each by Intrauterine route once., Disp: , Rfl:  .  Multiple Vitamins-Minerals (MULTIVITAMIN ADULT PO), Take 1 tablet by mouth daily. , Disp: , Rfl:  .  pantoprazole (PROTONIX) 40 MG tablet, Take 1 tablet (40 mg total) by mouth daily., Disp: 90 tablet, Rfl: 3  EXAM:  VITALS per patient if applicable: There were no vitals filed for this visit.   GENERAL: alert, oriented, appears well and in no acute distress  HEENT: atraumatic, conjunttiva clear, no obvious abnormalities on inspection of external nose and ears  NECK: normal movements of the head and neck  LUNGS: on inspection no signs of respiratory distress, breathing rate appears normal, no obvious gross  SOB, gasping or wheezing  CV: no obvious cyanosis  MS: moves all visible extremities without noticeable abnormality  PSYCH/NEURO: pleasant and cooperative, no obvious depression or anxiety, speech and thought processing grossly intact  ASSESSMENT AND PLAN:  Discussed the following assessment and plan:  Hyperglycemia  OSA (obstructive sleep apnea)  Morbid obesity (HCC)  Gastroesophageal reflux disease without esophagitis  Generalized anxiety disorder  -supported and encouraged healthy lifestyle -ordered Hgba1c, suspect improved -refilled cymbalta -follow up in 4 months with TOC with new PCP as scheduled  I discussed  the assessment and treatment plan with the patient. The patient was provided an opportunity to ask questions and all were answered. The patient agreed with the plan and demonstrated an understanding of the instructions.   The patient was advised to call back or seek an in-person evaluation if the symptoms worsen or if the condition fails to improve as anticipated.   Lucretia Kern, DO

## 2019-03-03 NOTE — Patient Instructions (Signed)

## 2019-03-04 NOTE — Progress Notes (Signed)
Call pt and left her a message to call back to make a lab appt and let us know if she want a flu shot

## 2019-03-05 ENCOUNTER — Encounter: Payer: Self-pay | Admitting: Family Medicine

## 2019-03-05 MED FILL — DULOXETINE HCL 60 MG CPEP: 60 | 90 days supply | Qty: 90 | Fill #0

## 2019-03-13 ENCOUNTER — Encounter: Payer: Self-pay | Admitting: Certified Nurse Midwife

## 2019-03-13 ENCOUNTER — Other Ambulatory Visit: Payer: Self-pay

## 2019-03-13 ENCOUNTER — Ambulatory Visit: Payer: 59 | Admitting: Certified Nurse Midwife

## 2019-03-13 VITALS — BP 118/80 | HR 70 | Temp 97.2°F | Resp 16 | Ht 67.75 in | Wt 380.0 lb

## 2019-03-13 DIAGNOSIS — Z01419 Encounter for gynecological examination (general) (routine) without abnormal findings: Secondary | ICD-10-CM | POA: Diagnosis not present

## 2019-03-13 NOTE — Patient Instructions (Signed)

## 2019-03-13 NOTE — Progress Notes (Signed)
33 y.o. G0P0000 Married  Caucasian Fe here for annual exam. Patient had gastric sleeve 01/05/2019 and has lost  64 pounds. Weight prior was 445. Doing well since surgery.  Periods scant to none with Mirena IUD working well no warning signs. Working at home now. Cymbalta working well. Had seen Dr.Kim for aex and Cymbalta. Continues with being monitored with weight loss with GI specialists. No health issues today." So excited for the weight loss!"  No LMP recorded. (Menstrual status: IUD).          Sexually active: Yes.    The current method of family planning is IUD.    Exercising: Yes.    walking Smoker:  no  Review of Systems  Constitutional: Negative.   HENT: Negative.   Eyes: Negative.   Respiratory: Negative.   Cardiovascular: Negative.   Gastrointestinal: Negative.   Genitourinary: Negative.   Musculoskeletal: Negative.   Skin: Negative.   Neurological: Negative.   Endo/Heme/Allergies: Negative.   Psychiatric/Behavioral: Negative.     Health Maintenance: Pap:  03-11-18 neg HPV HR neg History of Abnormal Pap: no MMG:  03-06-16 bilateral & left breast u/s category b density birads 1:neg Self Breast exams: yes Colonoscopy:  none BMD:   none TDaP:  2017 Shingles: no Pneumonia: no Hep C and HIV: not done Labs: with PCP.   reports that she has never smoked. She has never used smokeless tobacco. She reports previous alcohol use. She reports that she does not use drugs.  Past Medical History:  Diagnosis Date  . Anxiety   . Asthma   . Hyperglycemia   . Hyperlipemia   . Morbid obesity ([place])    sees weight loss clinic  . Nevus    sees dermatologist, Dr. Tonia Brooms  . Obstructive sleep apnea on CPAP     Past Surgical History:  Procedure Laterality Date  . finger laceration Right 2008   2nd finger  . implanon insertion  06-26-11   left upper arm  . LAPAROSCOPIC GASTRIC SLEEVE RESECTION N/A 01/05/2019   Procedure: LAPAROSCOPIC GASTRIC SLEEVE RESECTION, UPPER ENDO, ERAS  Pathway;  Surgeon: Clovis Riley, MD;  Location: [facility];  Service: General;  Laterality: N/A;  . MOLE REMOVAL    . OTHER SURGICAL HISTORY Left [id number]    Implanon  . WISDOM TOOTH EXTRACTION      Current Outpatient Medications  Medication Sig Dispense Refill  . acetaminophen (TYLENOL) 500 MG tablet Take 2 tablets (1,000 mg total) by mouth every 8 (eight) hours. Take this scheduled for the first 3-4 days after you get home, then take if needed 30 tablet 0  . CALCIUM PO Take 750 mg by mouth 3 (three) times daily.    . DULoxetine (CYMBALTA) 60 MG capsule Take 60 mg by mouth daily.    Marland Kitchen levonorgestrel (MIRENA) 20 MCG/24HR IUD 1 each by Intrauterine route once.    . Multiple Vitamins-Minerals (MULTIVITAMIN ADULT PO) Take 1 tablet by mouth daily.     . pantoprazole (PROTONIX) 40 MG tablet Take 1 tablet (40 mg total) by mouth daily. 90 tablet 3   No current facility-administered medications for this visit.     Family History  Problem Relation Age of Onset  . Atrial fibrillation Mother   . Diabetes Father   . Cancer Father        skin  . Polycystic ovary syndrome Sister   . Depression Sister   . Anxiety disorder Sister   . Bipolar disorder Sister   . Cancer Maternal  Grandfather        skin  . Dementia Maternal Grandmother     ROS:  Pertinent items are noted in HPI.  Otherwise, a comprehensive ROS was negative.  Exam:   BP 118/80   Pulse 70   Temp (!) 97.2 F (36.2 C) (Skin)   Resp 16   Ht 5' 7.75" (1.721 m)   Wt (!) 380 lb (172.4 kg)   BMI 58.21 kg/m  Height: 5' 7.75" (172.1 cm) Ht Readings from Last 3 Encounters:  03/13/19 5' 7.75" (1.721 m)  01/05/19 5\' 8"  (1.727 m)  01/01/19 5\' 8"  (1.727 m)    General appearance: alert, cooperative and appears stated age Head: Normocephalic, without obvious abnormality, atraumatic Neck: no adenopathy, supple, symmetrical, trachea midline and thyroid normal to inspection and palpation Lungs: clear to auscultation  bilaterally Breasts: normal appearance, no masses or tenderness, No nipple retraction or dimpling, No nipple discharge or bleeding, No axillary or supraclavicular adenopathy Heart: regular rate and rhythm Abdomen: soft, non-tender; no masses,  no organomegaly Extremities: extremities normal, atraumatic, no cyanosis or edema Skin: Skin color, texture, turgor normal. No rashes or lesions Lymph nodes: Cervical, supraclavicular, and axillary nodes normal. No abnormal inguinal nodes palpated Neurologic: Grossly normal   Pelvic: External genitalia:  no lesions              Urethra:  normal appearing urethra with no masses, tenderness or lesions              Bartholin's and Skene's: normal                 Vagina: normal appearing vagina with normal color and discharge, no lesions              Cervix: no cervical motion tenderness, no lesions, nulliparous appearance and IUD string noted in cervix              Pap taken: No. Bimanual Exam:  Uterus:  normal size, contour, position, consistency, mobility, non-tender and anteflexed              Adnexa: normal adnexa and no mass, fullness, tenderness, limited by body habitus               Rectovaginal: Confirms               Anus:  normal sphincter tone, no lesions  Chaperone present: yes  A:  Well Woman with normal exam  Contraception Mirena IUD inserted 11/18//19  History of gastric sleeve surgery with 64 pound weight loss at present  Anxiety/cholesterol/glucose with PCP management    P:   Reviewed health and wellness pertinent to exam  Warning signs reviewed and bleeding expectations  Continue follow up with GI. Congratulated on progress at this point.  Continue follow up as indicated with PCP  Pap smear: no   counseled on breast self exam, feminine hygiene, adequate intake of calcium and vitamin D, diet and exercise  return annually or prn  An After Visit Summary was printed and given to the patient.

## 2019-06-01 MED FILL — DULOXETINE HCL 60 MG CPEP: 60 | 90 days supply | Qty: 90 | Fill #1

## 2019-06-08 ENCOUNTER — Encounter: Payer: 59 | Admitting: Family Medicine

## 2019-06-16 ENCOUNTER — Other Ambulatory Visit: Payer: Self-pay

## 2019-06-16 ENCOUNTER — Encounter: Payer: 59 | Attending: Surgery | Admitting: Skilled Nursing Facility1

## 2019-06-16 DIAGNOSIS — E669 Obesity, unspecified: Secondary | ICD-10-CM | POA: Insufficient documentation

## 2019-06-17 NOTE — Progress Notes (Signed)
Follow-up visit:  Post-Operative sleeve Surgery  Medical Nutrition Therapy:  Appt start time: 6:00pm end time:  7:00pm  Primary concerns today: Post-operative Bariatric Surgery Nutrition Management 6 Month Post-Op Class  Start weight at [facility]: 422 lbs (date: 07/15/2018) Sleeve 12/25/2018   Body Composition Scale Date  Weight  lbs 361.7  Total Body Fat  % 50.4     Visceral Fat 19  Fat-Free Mass  % 49.5     Total Body Water  % 39.2     Muscle-Mass  lbs 36.9  BMI 55.1  Body Fat Displacement ---        Torso  lbs 113.3        Left Leg  lbs 22.6        Right Leg  lbs 22.6        Left Arm  lbs 11.3        Right Arm  lbs 11.3     Information Reviewed/ Discussed During Appointment: -Review of composition scale numbers -Fluid requirements (64-100 ounces) -Protein requirements (60-80g) -Strategies for tolerating diet -Advancement of diet to include Starchy vegetables -Barriers to inclusion of new foods -Inclusion of appropriate multivitamin and calcium supplements  -Exercise recommendations   Fluid intake: adequate   Medications: See List Supplementation: appropriate   Using straws: no Drinking while eating: no Having you been chewing well: yes Chewing/swallowing difficulties: no Changes in vision: no Changes to mood/headaches: no Hair loss/Cahnges to skin/Changes to nails: no Any difficulty focusing or concentrating: no Sweating: no Dizziness/Lightheaded: no Palpitations: no  Carbonated beverages: no N/V/D/C/GAS: no Abdominal Pain: no Dumping syndrome: no  Recent physical activity:  ADL's  Progress Towards Goal(s):  In Progress  Handouts given during visit include:  Phase V diet Progression   Goals Sheet  The Benefits of Exercise are endless.....  Support Group Topics  Pt Chosen Goals:  I will drink 32 ounces of plain water 7 days a week by (specific date) ______04/01/2021__________  I will say 2 nice things to and about myself 7 days a week by  (specific date) ___04/01/2021___________  Teaching Method Utilized:  Visual Auditory Hands on  Demonstrated degree of understanding via:  Teach Back   Monitoring/Evaluation:  Dietary intake, exercise, and body weight. Follow up in 3 months for 9 month post-op visit.

## 2019-07-06 MED FILL — PANTOPRAZOLE SOD DR 40 MG T: 40 | 90 days supply | Qty: 90 | Fill #2

## 2019-07-24 ENCOUNTER — Encounter: Payer: Self-pay | Admitting: Certified Nurse Midwife

## 2019-08-28 ENCOUNTER — Encounter: Payer: Self-pay | Admitting: Family Medicine

## 2019-08-28 ENCOUNTER — Ambulatory Visit (INDEPENDENT_AMBULATORY_CARE_PROVIDER_SITE_OTHER): Payer: 59 | Admitting: Family Medicine

## 2019-08-28 ENCOUNTER — Other Ambulatory Visit: Payer: Self-pay | Admitting: Family Medicine

## 2019-08-28 ENCOUNTER — Other Ambulatory Visit: Payer: Self-pay

## 2019-08-28 DIAGNOSIS — K219 Gastro-esophageal reflux disease without esophagitis: Secondary | ICD-10-CM | POA: Diagnosis not present

## 2019-08-28 DIAGNOSIS — R739 Hyperglycemia, unspecified: Secondary | ICD-10-CM | POA: Diagnosis not present

## 2019-08-28 DIAGNOSIS — E785 Hyperlipidemia, unspecified: Secondary | ICD-10-CM

## 2019-08-28 DIAGNOSIS — F411 Generalized anxiety disorder: Secondary | ICD-10-CM

## 2019-08-28 DIAGNOSIS — D649 Anemia, unspecified: Secondary | ICD-10-CM

## 2019-08-28 MED ORDER — DULOXETINE HCL 60 MG PO CPEP: 60.0000 mg | ORAL_CAPSULE | Freq: Every day | ORAL | 3 refills | Status: DC

## 2019-08-28 MED FILL — DULOXETINE HCL 60 MG CPEP: 60 | 90 days supply | Qty: 90 | Fill #0

## 2019-08-28 NOTE — Progress Notes (Signed)
Mary Brandt DOB: Oct 05, 1985 Encounter date: 08/28/2019  This is a 34 y.o. female who presents to establish care. Chief Complaint  Patient presents with  . Establish Care    History of present illness: No specific worries/concerns today.   Gastric sleeve 12/2018: recent visit with post op dietician (start weight 422 current weight 361). No complications since surgery.   Sleep apnea: still using machine. Was never severe. Started using machine by choice and has helped with snoring.   GERD: protonix 40mg  well controlled. Prior to surgery tried to go off of meds but was unable.   Anxiety: has been pretty good. No changes in this. Does counseling about every 2 weeks which helps. Still on cymbalta 60mg .    Sees deborah leonard for gyn needs; retiring but will continue at that practice.  Hasn't seen derm for a long time but plans to re-establish.   Had pneumonia as child and after that had more reactive airway disease when sick; has improved as adult. No wheezing. Only when she got sick.   Past Medical History:  Diagnosis Date  . Anxiety   . Asthma   . Hyperglycemia   . Hyperlipemia   . Morbid obesity ([place])    sees weight loss clinic  . Nevus    sees dermatologist, Dr. Tonia Brooms  . Obstructive sleep apnea on CPAP    Past Surgical History:  Procedure Laterality Date  . finger laceration Right 2008   2nd finger  . implanon insertion  06-26-11   left upper arm  . LAPAROSCOPIC GASTRIC SLEEVE RESECTION N/A 01/05/2019   Procedure: LAPAROSCOPIC GASTRIC SLEEVE RESECTION, UPPER ENDO, ERAS Pathway;  Surgeon: Clovis Riley, MD;  Location: [facility];  Service: General;  Laterality: N/A;  . MOLE REMOVAL    . OTHER SURGICAL HISTORY Left [id number]    Implanon  . WISDOM TOOTH EXTRACTION     No Known Allergies Current Meds  Medication Sig  . CALCIUM PO Take 750 mg by mouth 3 (three) times daily.  . DULoxetine (CYMBALTA) 60 MG capsule Take 1 capsule (60 mg total) by mouth daily.  Marland Kitchen  levonorgestrel (MIRENA) 20 MCG/24HR IUD 1 each by Intrauterine route once.  . Multiple Vitamins-Minerals (MULTIVITAMIN ADULT PO) Take 1 tablet by mouth daily.   . pantoprazole (PROTONIX) 40 MG tablet Take 1 tablet (40 mg total) by mouth daily.  . [DISCONTINUED] DULoxetine (CYMBALTA) 60 MG capsule Take 60 mg by mouth daily.   Social History   Tobacco Use  . Smoking status: Never Smoker  . Smokeless tobacco: Never Used  Substance Use Topics  . Alcohol use: Not Currently   Family History  Problem Relation Age of Onset  . Atrial fibrillation Mother   . Diabetes Father   . Cancer Father        skin  . Polycystic ovary syndrome Sister   . Depression Sister   . Anxiety disorder Sister   . Bipolar disorder Sister   . Cancer Maternal Grandfather        skin  . Dementia Maternal Grandmother      Review of Systems  Constitutional: Negative for chills, fatigue and fever.  Respiratory: Negative for cough, chest tightness, shortness of breath and wheezing.   Cardiovascular: Negative for chest pain, palpitations and leg swelling.    Objective:  BP 104/78 (BP Location: Right Arm, Patient Position: Sitting, Cuff Size: Large)   Pulse 83   Temp 98 F (36.7 C) (Temporal)   Ht 5\' 8"  (1.727 m)  Wt (!) 364 lb 3.2 oz (165.2 kg)   BMI 55.38 kg/m   Weight: (!) 364 lb 3.2 oz (165.2 kg)   BP Readings from Last 3 Encounters:  08/28/19 104/78  03/13/19 118/80  01/06/19 (!) 155/91   Wt Readings from Last 3 Encounters:  08/28/19 (!) 364 lb 3.2 oz (165.2 kg)  06/17/19 (!) 361 lb 11.2 oz (164.1 kg)  03/13/19 (!) 380 lb (172.4 kg)    Physical Exam Constitutional:      General: She is not in acute distress.    Appearance: She is well-developed.  Cardiovascular:     Rate and Rhythm: Normal rate and regular rhythm.     Heart sounds: Normal heart sounds. No murmur. No friction rub.  Pulmonary:     Effort: Pulmonary effort is normal. No respiratory distress.     Breath sounds: Normal  breath sounds. No wheezing or rales.  Musculoskeletal:     Right lower leg: No edema.     Left lower leg: No edema.  Neurological:     Mental Status: She is alert and oriented to person, place, and time.  Psychiatric:        Behavior: Behavior normal.     Assessment/Plan:  1. Morbid obesity ([place]) Working on diet, weight loss and continues to lose weight s/p sleeve.  2. Generalized anxiety disorder Stable/controlled with current cymbalta dose. - DULoxetine (CYMBALTA) 60 MG capsule; Take 1 capsule (60 mg total) by mouth daily.  Dispense: 90 capsule; Refill: 3  3. Hyperglycemia - Hemoglobin A1c; Future  4. Gastroesophageal reflux disease without esophagitis Stable on protonix; ok to use half current dose (20mg ) and see how she does with this. Has noted improvement with healthier eating/smaller portions.  5. Hyperlipidemia, unspecified hyperlipidemia type - Lipid panel; Future  Return in about 4 months (around 12/28/2019) for physical exam with bloodwork prior.  Micheline Rough, MD

## 2019-09-28 MED FILL — PANTOPRAZOLE SOD DR 40 MG T: 40 | 90 days supply | Qty: 90 | Fill #3

## 2019-09-30 ENCOUNTER — Encounter: Payer: Self-pay | Admitting: Family Medicine

## 2019-09-30 DIAGNOSIS — R42 Dizziness and giddiness: Secondary | ICD-10-CM

## 2019-09-30 DIAGNOSIS — D649 Anemia, unspecified: Secondary | ICD-10-CM

## 2019-09-30 DIAGNOSIS — E611 Iron deficiency: Secondary | ICD-10-CM

## 2019-09-30 DIAGNOSIS — R739 Hyperglycemia, unspecified: Secondary | ICD-10-CM

## 2019-09-30 DIAGNOSIS — E785 Hyperlipidemia, unspecified: Secondary | ICD-10-CM

## 2019-10-06 ENCOUNTER — Other Ambulatory Visit: Payer: Self-pay

## 2019-10-07 ENCOUNTER — Other Ambulatory Visit (INDEPENDENT_AMBULATORY_CARE_PROVIDER_SITE_OTHER): Payer: 59

## 2019-10-07 ENCOUNTER — Encounter: Payer: Self-pay | Admitting: Family Medicine

## 2019-10-07 DIAGNOSIS — E611 Iron deficiency: Secondary | ICD-10-CM | POA: Diagnosis not present

## 2019-10-07 DIAGNOSIS — E785 Hyperlipidemia, unspecified: Secondary | ICD-10-CM | POA: Diagnosis not present

## 2019-10-07 DIAGNOSIS — D649 Anemia, unspecified: Secondary | ICD-10-CM

## 2019-10-07 DIAGNOSIS — R739 Hyperglycemia, unspecified: Secondary | ICD-10-CM | POA: Diagnosis not present

## 2019-10-07 DIAGNOSIS — R42 Dizziness and giddiness: Secondary | ICD-10-CM | POA: Diagnosis not present

## 2019-10-07 LAB — IBC + FERRITIN
Ferritin: 17.1 ng/mL (ref 10.0–291.0)
Iron: 54 ug/dL (ref 42–145)
Saturation Ratios: 13.1 % — ABNORMAL LOW (ref 20.0–50.0)
Transferrin: 295 mg/dL (ref 212.0–360.0)

## 2019-10-07 LAB — COMPREHENSIVE METABOLIC PANEL
ALT: 19 U/L (ref 0–35)
AST: 15 U/L (ref 0–37)
Albumin: 3.9 g/dL (ref 3.5–5.2)
Alkaline Phosphatase: 94 U/L (ref 39–117)
BUN: 18 mg/dL (ref 6–23)
CO2: 31 mEq/L (ref 19–32)
Calcium: 9.1 mg/dL (ref 8.4–10.5)
Chloride: 104 mEq/L (ref 96–112)
Creatinine, Ser: 0.87 mg/dL (ref 0.40–1.20)
GFR: 74.16 mL/min (ref >60.00)
Glucose, Bld: 97 mg/dL (ref 70–99)
Potassium: 4.1 mEq/L (ref 3.5–5.1)
Sodium: 140 mEq/L (ref 135–145)
Total Bilirubin: 0.3 mg/dL (ref 0.2–1.2)
Total Protein: 6.7 g/dL (ref 6.0–8.3)

## 2019-10-07 LAB — CBC WITH DIFFERENTIAL/PLATELET
Basophils Absolute: 0.1 10*3/uL (ref 0.0–0.1)
Basophils Relative: 0.6 % (ref 0.0–3.0)
Eosinophils Absolute: 0.1 10*3/uL (ref 0.0–0.7)
Eosinophils Relative: 1.5 % (ref 0.0–5.0)
HCT: 37.7 % (ref 36.0–46.0)
Hemoglobin: 12.2 g/dL (ref 12.0–15.0)
Lymphocytes Relative: 26.3 % (ref 12.0–46.0)
Lymphs Abs: 2.2 10*3/uL (ref 0.7–4.0)
MCHC: 32.4 g/dL (ref 30.0–36.0)
MCV: 82.5 fl (ref 78.0–100.0)
Monocytes Absolute: 0.5 10*3/uL (ref 0.1–1.0)
Monocytes Relative: 6.4 % (ref 3.0–12.0)
Neutro Abs: 5.4 10*3/uL (ref 1.4–7.7)
Neutrophils Relative %: 65.2 % (ref 43.0–77.0)
Platelets: 332 10*3/uL (ref 150.0–400.0)
RBC: 4.58 Mil/uL (ref 3.87–5.11)
RDW: 16.5 % — ABNORMAL HIGH (ref 11.5–15.5)
WBC: 8.4 10*3/uL (ref 4.0–10.5)

## 2019-10-07 LAB — LIPID PANEL
Cholesterol: 159 mg/dL (ref 0–200)
HDL: 49 mg/dL (ref >39.00)
LDL Cholesterol: 94 mg/dL (ref 0–99)
NonHDL: 109.68
Total CHOL/HDL Ratio: 3
Triglycerides: 80 mg/dL (ref 0.0–149.0)
VLDL: 16 mg/dL (ref 0.0–40.0)

## 2019-10-07 LAB — VITAMIN B12: Vitamin B-12: 1389 pg/mL — ABNORMAL HIGH (ref 211–911)

## 2019-10-07 LAB — T3, FREE: T3, Free: 3.9 pg/mL (ref 2.3–4.2)

## 2019-10-07 LAB — TSH: TSH: 1.05 u[IU]/mL (ref 0.35–4.50)

## 2019-10-07 LAB — HEMOGLOBIN A1C: Hgb A1c MFr Bld: 6.4 % (ref 4.6–6.5)

## 2019-10-07 LAB — T4, FREE: Free T4: 0.94 ng/dL (ref 0.60–1.60)

## 2019-10-07 LAB — VITAMIN D 25 HYDROXY (VIT D DEFICIENCY, FRACTURES): VITD: 40.83 ng/mL (ref 30.00–100.00)

## 2019-10-09 LAB — MAGNESIUM, RBC: Magnesium RBC: 6 mg/dL (ref 4.0–6.4)

## 2019-10-13 ENCOUNTER — Encounter: Payer: Self-pay | Admitting: Family Medicine

## 2019-12-02 MED FILL — DULOXETINE HCL 60 MG CPEP: 60 | 90 days supply | Qty: 90 | Fill #1

## 2019-12-21 ENCOUNTER — Other Ambulatory Visit: Payer: 59

## 2019-12-28 ENCOUNTER — Ambulatory Visit: Payer: 59 | Admitting: Family Medicine

## 2020-01-07 ENCOUNTER — Encounter: Payer: Self-pay | Admitting: Family Medicine

## 2020-01-08 ENCOUNTER — Other Ambulatory Visit: Payer: Self-pay | Admitting: Family Medicine

## 2020-01-08 MED ORDER — PANTOPRAZOLE SODIUM 40 MG PO TBEC: 40.0000 mg | DELAYED_RELEASE_TABLET | Freq: Every day | ORAL | 3 refills | Status: DC

## 2020-01-08 MED FILL — PANTOPRAZOLE SOD DR 40 MG T: 40 | 90 days supply | Qty: 90 | Fill #0

## 2020-01-25 MED FILL — PANTOPRAZOLE SOD DR 40 MG T: 40 | 90 days supply | Qty: 90 | Fill #0

## 2020-02-08 ENCOUNTER — Ambulatory Visit: Payer: 59 | Admitting: Family Medicine

## 2020-03-09 ENCOUNTER — Ambulatory Visit: Payer: 59 | Admitting: Family Medicine

## 2020-03-14 ENCOUNTER — Ambulatory Visit: Payer: 59 | Admitting: Certified Nurse Midwife

## 2020-03-21 ENCOUNTER — Encounter: Payer: Self-pay | Admitting: Family Medicine

## 2020-03-21 ENCOUNTER — Other Ambulatory Visit: Payer: Self-pay

## 2020-03-21 ENCOUNTER — Ambulatory Visit (INDEPENDENT_AMBULATORY_CARE_PROVIDER_SITE_OTHER): Payer: 59 | Admitting: Family Medicine

## 2020-03-21 ENCOUNTER — Other Ambulatory Visit: Payer: Self-pay | Admitting: Family Medicine

## 2020-03-21 VITALS — BP 108/80 | HR 77 | Temp 98.1°F | Ht 68.0 in | Wt 372.9 lb

## 2020-03-21 DIAGNOSIS — Z23 Encounter for immunization: Secondary | ICD-10-CM | POA: Diagnosis not present

## 2020-03-21 DIAGNOSIS — Z0000 Encounter for general adult medical examination without abnormal findings: Secondary | ICD-10-CM

## 2020-03-21 DIAGNOSIS — E611 Iron deficiency: Secondary | ICD-10-CM

## 2020-03-21 DIAGNOSIS — G4733 Obstructive sleep apnea (adult) (pediatric): Secondary | ICD-10-CM | POA: Diagnosis not present

## 2020-03-21 DIAGNOSIS — R748 Abnormal levels of other serum enzymes: Secondary | ICD-10-CM

## 2020-03-21 DIAGNOSIS — K219 Gastro-esophageal reflux disease without esophagitis: Secondary | ICD-10-CM

## 2020-03-21 DIAGNOSIS — R739 Hyperglycemia, unspecified: Secondary | ICD-10-CM | POA: Diagnosis not present

## 2020-03-21 MED ORDER — OMEPRAZOLE 20 MG PO CPDR: 20.0000 mg | DELAYED_RELEASE_CAPSULE | Freq: Every day | ORAL | 3 refills | Status: DC

## 2020-03-21 MED FILL — OMEPRAZOLE 20 MG CAP: 20 | 90 days supply | Qty: 90 | Fill #0

## 2020-03-21 NOTE — Progress Notes (Signed)
Mary Brandt DOB: [birth year] Encounter date: 03/21/2020  This is a 34 y.o. female who presents for complete physical   History of present illness/Additional concerns: Last visit with me was 08/28/2019 to establish care.  She has been promoted at work and feels that this has been going well. She has noticed in past that with big transitions she gains weight, but has done better this transition with staying stable.   Generalized anxiety disorder: Cymbalta 60 mg. Mood has done well.   Working on weight loss status post gastric sleeve. Working with husband to do this together. Trying to track meals during week. Also still going to counseling q 3 weeks. Goal is to preplan at least 2 of meals daily and still track regardless. Being less strict on weekends; but not fully. Would like to cut self off in evening. Focus is on eating now rather than exercise. Does have two dogs (boxers) who need exercise.   She does follow with gynecology regularly.  GERD: protonix 40mg  daily when she had surgery; but switched back to omeprazole because it is cheaper for her and seems to control symptoms.   Has seen dermatology.  Past Medical History:  Diagnosis Date  . Anxiety   . Asthma   . Hyperglycemia   . Hyperlipemia   . Morbid obesity ([place])    sees weight loss clinic  . Nevus    sees dermatologist, Dr. Tonia Brooms  . Obstructive sleep apnea on CPAP    Past Surgical History:  Procedure Laterality Date  . finger laceration Right 2008   2nd finger  . implanon insertion  06-26-11   left upper arm  . LAPAROSCOPIC GASTRIC SLEEVE RESECTION N/A 01/05/2019   Procedure: LAPAROSCOPIC GASTRIC SLEEVE RESECTION, UPPER ENDO, ERAS Pathway;  Surgeon: Clovis Riley, MD;  Location: [facility];  Service: General;  Laterality: N/A;  . MOLE REMOVAL    . OTHER SURGICAL HISTORY Left [id number]    Implanon  . WISDOM TOOTH EXTRACTION     No Known Allergies Current Meds  Medication Sig  . CALCIUM PO Take 750 mg by mouth 3  (three) times daily.  . DULoxetine (CYMBALTA) 60 MG capsule Take 1 capsule (60 mg total) by mouth daily.  Marland Kitchen levonorgestrel (MIRENA) 20 MCG/24HR IUD 1 each by Intrauterine route once.  . Multiple Vitamins-Minerals (MULTIVITAMIN ADULT PO) Take 1 tablet by mouth daily.   . [DISCONTINUED] OMEPRAZOLE PO Take by mouth.   Social History   Tobacco Use  . Smoking status: Never Smoker  . Smokeless tobacco: Never Used  Substance Use Topics  . Alcohol use: Not Currently   Family History  Problem Relation Age of Onset  . Atrial fibrillation Mother   . Diabetes Father   . Cancer Father        skin  . Polycystic ovary syndrome Sister   . Depression Sister   . Anxiety disorder Sister   . Bipolar disorder Sister   . Cancer Maternal Grandfather        skin  . Dementia Maternal Grandmother      Review of Systems  Constitutional: Negative for chills, fatigue and fever.  Respiratory: Negative for cough, chest tightness, shortness of breath and wheezing.   Cardiovascular: Negative for chest pain, palpitations and leg swelling.    CBC:  Lab Results  Component Value Date   WBC 8.4 10/07/2019   HGB 12.2 10/07/2019   HCT 37.7 10/07/2019   MCH 27.1 01/06/2019   MCHC 32.4 10/07/2019   RDW 16.5 (  H) 10/07/2019   PLT 332.0 10/07/2019   CMP: Lab Results  Component Value Date   NA 140 10/07/2019   K 4.1 10/07/2019   CL 104 10/07/2019   CO2 31 10/07/2019   ANIONGAP 12 01/06/2019   GLUCOSE 97 10/07/2019   BUN 18 10/07/2019   CREATININE 0.87 10/07/2019   CREATININE 0.89 10/05/2013   GFRAA >60 01/06/2019   CALCIUM 9.1 10/07/2019   PROT 6.7 10/07/2019   BILITOT 0.3 10/07/2019   ALKPHOS 94 10/07/2019   ALT 19 10/07/2019   AST 15 10/07/2019   LIPID: Lab Results  Component Value Date   CHOL 159 10/07/2019   TRIG 80.0 10/07/2019   HDL 49.00 10/07/2019   LDLCALC 94 10/07/2019    Objective:  BP 108/80 (BP Location: Right Arm, Patient Position: Sitting, Cuff Size: Large)   Pulse 77    Temp 98.1 F (36.7 C) (Oral)   Ht 5\' 8"  (1.727 m)   Wt (!) 372 lb 14.4 oz (169.1 kg)   BMI 56.70 kg/m   Weight: (!) 372 lb 14.4 oz (169.1 kg)   BP Readings from Last 3 Encounters:  03/21/20 108/80  08/28/19 104/78  03/13/19 118/80   Wt Readings from Last 3 Encounters:  03/21/20 (!) 372 lb 14.4 oz (169.1 kg)  08/28/19 (!) 364 lb 3.2 oz (165.2 kg)  06/17/19 (!) 361 lb 11.2 oz (164.1 kg)    Physical Exam Constitutional:      General: She is not in acute distress.    Appearance: She is well-developed.  Cardiovascular:     Rate and Rhythm: Normal rate and regular rhythm.     Heart sounds: Normal heart sounds. No murmur heard.  No friction rub.  Pulmonary:     Effort: Pulmonary effort is normal. No respiratory distress.     Breath sounds: Normal breath sounds. No wheezing or rales.  Musculoskeletal:     Right lower leg: No edema.     Left lower leg: No edema.  Neurological:     Mental Status: She is alert and oriented to person, place, and time.  Psychiatric:        Behavior: Behavior normal.     Assessment/Plan: Health Maintenance Due  Topic Date Due  . Hepatitis C Screening  Never done  . INFLUENZA VACCINE  12/06/2019   Health Maintenance reviewed.  1. Preventative health care Keep up with work on healthy eating.   2. OSA (obstructive sleep apnea) Does use CPAP nightly.  3. Gastroesophageal reflux disease without esophagitis Well controlled with omeprazole.  4. Hyperglycemia Will recheck today A1C. Has been diet controlled.   5. Morbid obesity ([place]) She is working on [employer].   Return in about 6 months (around 09/18/2020) for Chronic condition visit.  Micheline Rough, MD

## 2020-03-21 NOTE — Addendum Note (Signed)
Addended by: Agnes Lawrence on: 03/21/2020 09:59 AM   Modules accepted: Orders

## 2020-03-21 NOTE — Addendum Note (Signed)
Addended by: Marrion Coy on: 03/21/2020 10:02 AM   Modules accepted: Orders

## 2020-03-22 LAB — VITAMIN B12: Vitamin B-12: 725 pg/mL (ref 200–1100)

## 2020-03-22 LAB — HEMOGLOBIN A1C
Hgb A1c MFr Bld: 6 % of total Hgb — ABNORMAL HIGH (ref <5.7)
Mean Plasma Glucose: 126 (calc)
eAG (mmol/L): 7 (calc)

## 2020-03-22 LAB — HEPATITIS C ANTIBODY
Hepatitis C Ab: NONREACTIVE
SIGNAL TO CUT-OFF: 0.01 (ref <1.00)

## 2020-03-22 LAB — IRON,TIBC AND FERRITIN PANEL
%SAT: 10 % (calc) — ABNORMAL LOW (ref 16–45)
Ferritin: 19 ng/mL (ref 16–154)
Iron: 39 ug/dL — ABNORMAL LOW (ref 40–190)
TIBC: 405 mcg/dL (calc) (ref 250–450)

## 2020-04-04 ENCOUNTER — Ambulatory Visit: Payer: 59 | Admitting: Obstetrics & Gynecology

## 2020-04-07 ENCOUNTER — Encounter: Payer: Self-pay | Admitting: Family Medicine

## 2020-04-15 MED FILL — OMEPRAZOLE 20 MG CAP: 20 | 90 days supply | Qty: 90 | Fill #0

## 2020-04-20 NOTE — Telephone Encounter (Signed)
Spoke with the pt and offered an appt with another provider as PCP does not have any openings until January 2022.  Per pts preference an appt was scheduled with the PCP on 05/27/2020.  Patient was advised to call for a sooner appt with another provider if needed and to check for cancellations.

## 2020-05-27 ENCOUNTER — Ambulatory Visit: Payer: 59 | Admitting: Family Medicine

## 2020-06-02 ENCOUNTER — Ambulatory Visit (INDEPENDENT_AMBULATORY_CARE_PROVIDER_SITE_OTHER): Payer: 59 | Admitting: Family Medicine

## 2020-06-02 ENCOUNTER — Other Ambulatory Visit: Payer: Self-pay

## 2020-06-02 ENCOUNTER — Encounter: Payer: Self-pay | Admitting: Family Medicine

## 2020-06-02 ENCOUNTER — Ambulatory Visit: Payer: 59

## 2020-06-02 VITALS — BP 102/80 | HR 73 | Temp 97.9°F | Ht 68.0 in | Wt 379.2 lb

## 2020-06-02 DIAGNOSIS — M533 Sacrococcygeal disorders, not elsewhere classified: Secondary | ICD-10-CM

## 2020-06-02 NOTE — Progress Notes (Signed)
Mary Brandt DOB: October 08, 1985 Encounter date: 06/02/2020  This is a 35 y.o. female who presents with Chief Complaint  Patient presents with  . Back Pain    Patient complains of left sided low back pain x3 months, radiates to posterior thigh, no known injury    History of present illness: No specific injury to make back flare up, but shortly after last visit back just got worse until about 2 weeks ago when she stopped noticing it regularly. Still sometimes hurts; worse at times - like rolling over in bed. Previously was having trouble even sleeping.  Walking is issue. Feels like she is slapping left foot down rather than walking normally. Pain down back and into leg. Majority of pain was in leg and not back when bothering her. Worse at beginning of day; with getting out of bed - hard to stand - had to sit and pause between position changes. Once moving during day she would be fine. Laying down was worst position for her.   Sister has had sciatica and gave her some tips from PT - like sitting on structured chair - has helped with back.   Leg pain posterior left thigh. No weakness noted. No numbness of left leg (some lateral right thigh numbness but this is chronic; not worse).   Not sure she can differentiate if pain from low back or hips.   This is worst flare of back pain that she has had previous pains were different (hurt to bend over) but not debilitating.   Took aleve once daily off and on and used tens unit off and on but didn't feel like this helped.    No Known Allergies Current Meds  Medication Sig  . DULoxetine (CYMBALTA) 60 MG capsule Take 1 capsule (60 mg total) by mouth daily.  . Ferrous Sulfate (IRON PO) Take by mouth daily. Take 2 tablets daily  . levonorgestrel (MIRENA) 20 MCG/24HR IUD 1 each by Intrauterine route once.  . Multiple Vitamins-Minerals (MULTIVITAMIN ADULT PO) Take 1 tablet by mouth daily.   Marland Kitchen omeprazole (PRILOSEC) 20 MG capsule Take 1 capsule (20 mg  total) by mouth daily.    Review of Systems  Constitutional: Negative for chills, fatigue and fever.  Respiratory: Negative for cough, chest tightness, shortness of breath and wheezing.   Cardiovascular: Negative for chest pain, palpitations and leg swelling.  Musculoskeletal: Positive for back pain.    Objective:  BP 102/80 (BP Location: Left Arm, Patient Position: Sitting, Cuff Size: Large)   Pulse 73   Temp 97.9 F (36.6 C) (Oral)   Ht 5\' 8"  (1.727 m)   Wt (!) 379 lb 3.2 oz (172 kg)   BMI 57.66 kg/m   Weight: (!) 379 lb 3.2 oz (172 kg)   BP Readings from Last 3 Encounters:  06/02/20 102/80  03/21/20 108/80  08/28/19 104/78   Wt Readings from Last 3 Encounters:  06/02/20 (!) 379 lb 3.2 oz (172 kg)  03/21/20 (!) 372 lb 14.4 oz (169.1 kg)  08/28/19 (!) 364 lb 3.2 oz (165.2 kg)    Physical Exam Constitutional:      General: She is not in acute distress.    Appearance: She is well-developed.  Cardiovascular:     Rate and Rhythm: Normal rate and regular rhythm.     Heart sounds: Normal heart sounds. No murmur heard. No friction rub.  Pulmonary:     Effort: Pulmonary effort is normal. No respiratory distress.     Breath sounds: Normal breath  sounds. No wheezing or rales.  Musculoskeletal:     Right lower leg: No edema.     Left lower leg: No edema.  Neurological:     Mental Status: She is alert and oriented to person, place, and time.     Deep Tendon Reflexes:     Reflex Scores:      Patellar reflexes are 2+ on the right side and 2+ on the left side.      Achilles reflexes are 2+ on the right side and 2+ on the left side. Psychiatric:        Behavior: Behavior normal.     Assessment/Plan  1. Sacroiliac pain Will order xray in case of worsening so she can complete. We discussed some stretches she can do and discussed importance of flexibility as well as maintaining core strength. Discussed as well that with continued weight loss this will help take off some  pressure on back. She was interested in PT to help with this and hopefully prevent further back flares. She will let me know if more intervention is needed.  - DG Lumbar Spine Complete; Future    Return if symptoms worsen or fail to improve.     Micheline Rough, MD

## 2020-06-03 MED FILL — DULOXETINE HCL 60 MG CPEP: 60 | 90 days supply | Qty: 90 | Fill #3

## 2020-06-09 ENCOUNTER — Other Ambulatory Visit: Payer: Self-pay

## 2020-06-09 ENCOUNTER — Ambulatory Visit: Payer: 59 | Attending: Family Medicine | Admitting: Physical Therapy

## 2020-06-09 ENCOUNTER — Encounter: Payer: Self-pay | Admitting: Physical Therapy

## 2020-06-09 DIAGNOSIS — M5442 Lumbago with sciatica, left side: Secondary | ICD-10-CM | POA: Insufficient documentation

## 2020-06-09 DIAGNOSIS — R252 Cramp and spasm: Secondary | ICD-10-CM | POA: Insufficient documentation

## 2020-06-09 NOTE — Therapy (Signed)
[facility]. [place], [place], [address] Phone: [phone]   Fax:  [phone]  Physical Therapy Evaluation  Patient Details  Name: Mary Brandt MRN: [id number] Date of Birth: [birth year] Referring Provider (PT): Ethlyn Gallery   Encounter Date: 06/09/2020   PT End of Session - 06/09/20 1013    Visit Number 1    Date for PT Re-Evaluation 08/07/20    PT Start Time 0925    PT Stop Time 1004    PT Time Calculation (min) 39 min    Activity Tolerance Patient tolerated treatment well    Behavior During Therapy [facility] for tasks assessed/performed           Past Medical History:  Diagnosis Date  . Anxiety   . Asthma   . Hyperglycemia   . Hyperlipemia   . Morbid obesity ([place])    sees weight loss clinic  . Nevus    sees dermatologist, Dr. Tonia Brooms  . Obstructive sleep apnea on CPAP     Past Surgical History:  Procedure Laterality Date  . finger laceration Right 2008   2nd finger  . implanon insertion  06-26-11   left upper arm  . LAPAROSCOPIC GASTRIC SLEEVE RESECTION N/A 01/05/2019   Procedure: LAPAROSCOPIC GASTRIC SLEEVE RESECTION, UPPER ENDO, ERAS Pathway;  Surgeon: Clovis Riley, MD;  Location: [facility];  Service: General;  Laterality: N/A;  . MOLE REMOVAL    . OTHER SURGICAL HISTORY Left [id number]    Implanon  . WISDOM TOOTH EXTRACTION      There were no vitals filed for this visit.    Subjective Assessment - 06/09/20 0925    Subjective Pt reports that she began experiencing LBP with no known MOI ~3-4 months ago. Pt states pain was improving and worsening over a few months. States that she would like to get exercises/stretches to help prevent recurrence. Pt reports pain was radiating from L LB to posterior LLE at about mid calf. Pt reports that walking and standing helped ease pain and was difficult to lie down flat. Pt denies N/T in LLE.    Pertinent History sleep apnea    Diagnostic tests xrays lumbar  spine ordered    Patient Stated Goals to get ex's to help prevent recurrence in future    Currently in Pain? Yes    Pain Score 0-No pain    Pain Location Back    Pain Orientation Lower;Left    Pain Descriptors / Indicators Sharp    Pain Type Acute pain    Pain Radiating Towards L posterior LE    Pain Onset More than a month ago    Pain Frequency Intermittent    Aggravating Factors  lying flat    Pain Relieving Factors standing, walking, bending              [facility] PT Assessment - 06/09/20 0001      Assessment   Medical Diagnosis LBP    Referring Provider (PT) [place]    Hand Dominance Right    Prior Therapy none      Precautions   Precautions None      Restrictions   Weight Bearing Restrictions No      Balance Screen   Has the patient fallen in the past 6 months Yes    How many times? 1   fell in snow   Has the patient had a decrease in activity level because of a fear of falling?  No  Is the patient reluctant to leave their home because of a fear of falling?  No      Home Environment   Additional Comments stairs at home; reports no trouble with stairs      Prior Function   Level of Independence Independent    Vocation Full time employment    Vocation Requirements attorney; travel/driving and prolonged sitting    Leisure walking with dogs      Sensation   Light Touch Appears Intact      Functional Tests   Functional tests Sit to Stand      Sit to Stand   Comments WFL      ROM / Strength   AROM / PROM / Strength AROM;Strength      AROM   AROM Assessment Site Lumbar    Lumbar Flexion mild limitation d/t HS tightness    Lumbar Extension 25% limited    Lumbar - Right Side Bend WFL    Lumbar - Left Side Bend mild L sided LBP reported    Lumbar - Right Rotation WFL    Lumbar - Left Rotation WFL      Strength   Overall Strength Comments BLE strength 5/5; some core weakness noted      Palpation   Spinal mobility hypomobility of lumbar spine     Palpation comment tender to palpation L glute med/piriformis      Special Tests   Other special tests SLR WFL B      Transfers   Five time sit to stand comments  [facility]      Ambulation/Gait   Gait Comments WFL                      Objective measurements completed on examination: See above findings.       [facility] Adult PT Treatment/Exercise - 06/09/20 0001      Exercises   Exercises Lumbar      Lumbar Exercises: Stretches   Lower Trunk Rotation 5 reps;10 seconds    Pelvic Tilt 10 reps;5 seconds    Piriformis Stretch 2 reps;20 seconds    Piriformis Stretch Limitations seated      Lumbar Exercises: Supine   Bridge 10 reps;3 seconds                  PT Education - 06/09/20 1012    Education Details Pt educated on POC and HEP    Person(s) Educated Patient    Methods Explanation;Demonstration;Handout    Comprehension Verbalized understanding;Returned demonstration            PT Short Term Goals - 06/09/20 1203      PT SHORT TERM GOAL #1   Title Pt will be I with initial HEP    Time 2    Period Weeks    Status New    Target Date 06/23/20             PT Long Term Goals - 06/09/20 1203      PT LONG TERM GOAL #1   Title Pt will be I with advanced HEP    Time 4    Period Weeks    Status New    Target Date 07/07/20      PT LONG TERM GOAL #2   Title Pt will report no further flareups of LBP/radiating LLE pain    Time 4    Period Weeks    Status New    Target Date 07/07/20  PT LONG TERM GOAL #3   Title Pt will demo understanding of posture/body mechanics    Time 4    Period Weeks    Status New    Target Date 07/07/20      PT LONG TERM GOAL #4   Title Pt will demo understanding of stretches/exercises to prevent recurrence of LBP    Time 4    Period Weeks    Status New    Target Date 07/07/20                  Plan - 06/09/20 1152    Clinical Impression Statement Pt presents to clinic with reports of intermittently  recurring LBP with LLE radiating pain. Pt reports pain began ~2-3 mos ago and has been flaring up for ~3 days-1 week at a time. Currently, pt is not experiencing acute pain; however, she would like stretching/exercises to help prevent recurrence in the future. 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 Education on taking stretch breaks every hour as possible. Pt demos limited lumbar extension and reports relief with lumbar flexion. She has mild tightness to B piriformis L>R along with tenderness to palpation L glute med/piriformis. Pt also demos tightness and hypermobility of lumbar spine. Issued HEP for core/lumbar stabilization ex's along with flexibility ex's. Pt plans to return 1x/week for the next few weeks for education on gym/home ex's for lumbar stab and flexibility to prevent further acute exacerbations of LBP.    Examination-Participation Restrictions Occupation;Community Activity;Interpersonal Relationship    Stability/Clinical Decision Making Stable/Uncomplicated    Clinical Decision Making Low    Rehab Potential Good    PT Frequency 1x / week    PT Duration 4 weeks    PT Treatment/Interventions ADLs/Self Care Home Management;Electrical Stimulation;Iontophoresis 4mg /ml Dexamethasone;Moist Heat;Therapeutic activities;Therapeutic exercise;Neuromuscular re-education;Manual techniques;Patient/family education;Passive range of motion;Dry needling    PT Next Visit Plan check SI if indicated, lumbar/core stab ex's, education/progression of home ex's, manual/modalities as indicated    PT Home Exercise Plan Access Code: [email or web address]: see pt instructions    Consulted and Agree with Plan of Care Patient           Patient will benefit from skilled therapeutic intervention in order to improve the following deficits and impairments:  Decreased range of motion,Increased muscle spasms,Pain,Impaired  flexibility,Hypomobility  Visit Diagnosis: Acute left-sided low back pain with left-sided sciatica  Cramp and spasm     Problem List Patient Active Problem List   Diagnosis Date Noted  . Nutritional counseling 03/14/2018  . OSA (obstructive sleep apnea) 03/22/2016  . Low serum iron 12/01/2015  . Hyperglycemia 01/26/2014  . Morbid obesity ([place]) 01/26/2014  . GERD (gastroesophageal reflux disease) 01/26/2014  . Generalized anxiety disorder 01/26/2014   Amador Cunas, PT, DPT [facility] 06/09/2020, 12:08 PM  [facility]. [place], [place], [address] Phone: [phone]   Fax:  [phone]  Name: Mary Brandt MRN: [id number] Date of Birth: Jun 30, 1985

## 2020-06-09 NOTE — Patient Instructions (Signed)
Access Code: [email or web address] URL: [email or web address] Date: 06/09/2020 Prepared by: Amador Cunas  Exercises Supine Posterior Pelvic Tilt - 1 x daily - 5 x weekly - 3 sets - 10 reps - 3 sec hold Supine Bridge - 1 x daily - 5 x weekly - 3 sets - 10 reps - 3 sec hold Supine Lower Trunk Rotation - 1 x daily - 5 x weekly - 3 sets - 5 reps - 5-10 sec hold Standing Shoulder Row with Anchored Resistance - 1 x daily - 5 x weekly - 3 sets - 10 reps Shoulder Extension with Resistance - 1 x daily - 5 x weekly - 3 sets - 10 reps Seated Piriformis Stretch - 1 x daily - 5 x weekly - 3 sets - 2 reps - 15-20 sec hold Seated Piriformis Stretch - 1 x daily - 5 x weekly - 3 sets - 2 reps - 15-20 sec hold

## 2020-06-16 ENCOUNTER — Ambulatory Visit: Payer: 59 | Admitting: Physical Therapy

## 2020-07-09 MED FILL — OMEPRAZOLE 20 MG CAP: 20 | 90 days supply | Qty: 90 | Fill #1

## 2020-07-27 ENCOUNTER — Encounter (HOSPITAL_COMMUNITY): Payer: Self-pay | Admitting: *Deleted

## 2020-09-06 ENCOUNTER — Encounter: Payer: Self-pay | Admitting: Family Medicine

## 2020-09-06 DIAGNOSIS — F411 Generalized anxiety disorder: Secondary | ICD-10-CM

## 2020-09-08 ENCOUNTER — Other Ambulatory Visit (HOSPITAL_COMMUNITY): Payer: Self-pay

## 2020-09-08 MED ORDER — DULOXETINE HCL 60 MG PO CPEP
ORAL_CAPSULE | Freq: Every day | ORAL | 3 refills | Status: DC
  Filled 2020-09-08: qty 90, 90d supply, fill #0
  Filled 2020-12-16: qty 90, 90d supply, fill #1
  Filled 2021-03-20: qty 90, 90d supply, fill #2
  Filled 2021-06-19: qty 90, 90d supply, fill #3

## 2020-09-19 ENCOUNTER — Encounter: Payer: Self-pay | Admitting: Family Medicine

## 2020-09-19 ENCOUNTER — Ambulatory Visit (INDEPENDENT_AMBULATORY_CARE_PROVIDER_SITE_OTHER): Payer: 59 | Admitting: Family Medicine

## 2020-09-19 ENCOUNTER — Other Ambulatory Visit: Payer: Self-pay

## 2020-09-19 DIAGNOSIS — E559 Vitamin D deficiency, unspecified: Secondary | ICD-10-CM | POA: Diagnosis not present

## 2020-09-19 DIAGNOSIS — K219 Gastro-esophageal reflux disease without esophagitis: Secondary | ICD-10-CM | POA: Diagnosis not present

## 2020-09-19 DIAGNOSIS — R739 Hyperglycemia, unspecified: Secondary | ICD-10-CM

## 2020-09-19 DIAGNOSIS — G4733 Obstructive sleep apnea (adult) (pediatric): Secondary | ICD-10-CM | POA: Diagnosis not present

## 2020-09-19 DIAGNOSIS — E611 Iron deficiency: Secondary | ICD-10-CM | POA: Diagnosis not present

## 2020-09-19 DIAGNOSIS — E538 Deficiency of other specified B group vitamins: Secondary | ICD-10-CM | POA: Diagnosis not present

## 2020-09-19 DIAGNOSIS — E785 Hyperlipidemia, unspecified: Secondary | ICD-10-CM | POA: Diagnosis not present

## 2020-09-19 DIAGNOSIS — F411 Generalized anxiety disorder: Secondary | ICD-10-CM

## 2020-09-19 LAB — CBC WITH DIFFERENTIAL/PLATELET
Basophils Absolute: 0.1 10*3/uL (ref 0.0–0.1)
Basophils Relative: 0.8 % (ref 0.0–3.0)
Eosinophils Absolute: 0.1 10*3/uL (ref 0.0–0.7)
Eosinophils Relative: 1.4 % (ref 0.0–5.0)
HCT: 41.2 % (ref 36.0–46.0)
Hemoglobin: 13.3 g/dL (ref 12.0–15.0)
Lymphocytes Relative: 31.2 % (ref 12.0–46.0)
Lymphs Abs: 2.8 10*3/uL (ref 0.7–4.0)
MCHC: 32.4 g/dL (ref 30.0–36.0)
MCV: 86.7 fl (ref 78.0–100.0)
Monocytes Absolute: 0.9 10*3/uL (ref 0.1–1.0)
Monocytes Relative: 9.7 % (ref 3.0–12.0)
Neutro Abs: 5.1 10*3/uL (ref 1.4–7.7)
Neutrophils Relative %: 56.9 % (ref 43.0–77.0)
Platelets: 342 10*3/uL (ref 150.0–400.0)
RBC: 4.75 Mil/uL (ref 3.87–5.11)
RDW: 15.6 % — ABNORMAL HIGH (ref 11.5–15.5)
WBC: 8.9 10*3/uL (ref 4.0–10.5)

## 2020-09-19 LAB — LIPID PANEL
Cholesterol: 149 mg/dL (ref 0–200)
HDL: 47.1 mg/dL (ref >39.00)
LDL Cholesterol: 72 mg/dL (ref 0–99)
NonHDL: 102.06
Total CHOL/HDL Ratio: 3
Triglycerides: 150 mg/dL — ABNORMAL HIGH (ref 0.0–149.0)
VLDL: 30 mg/dL (ref 0.0–40.0)

## 2020-09-19 LAB — VITAMIN D 25 HYDROXY (VIT D DEFICIENCY, FRACTURES): VITD: 32.02 ng/mL (ref 30.00–100.00)

## 2020-09-19 LAB — COMPREHENSIVE METABOLIC PANEL
ALT: 86 U/L — ABNORMAL HIGH (ref 0–35)
AST: 28 U/L (ref 0–37)
Albumin: 4.1 g/dL (ref 3.5–5.2)
Alkaline Phosphatase: 127 U/L — ABNORMAL HIGH (ref 39–117)
BUN: 16 mg/dL (ref 6–23)
CO2: 29 mEq/L (ref 19–32)
Calcium: 9.3 mg/dL (ref 8.4–10.5)
Chloride: 102 mEq/L (ref 96–112)
Creatinine, Ser: 0.8 mg/dL (ref 0.40–1.20)
GFR: 95.21 mL/min (ref >60.00)
Glucose, Bld: 103 mg/dL — ABNORMAL HIGH (ref 70–99)
Potassium: 4.2 mEq/L (ref 3.5–5.1)
Sodium: 141 mEq/L (ref 135–145)
Total Bilirubin: 0.3 mg/dL (ref 0.2–1.2)
Total Protein: 7 g/dL (ref 6.0–8.3)

## 2020-09-19 LAB — IRON: Iron: 69 ug/dL (ref 42–145)

## 2020-09-19 LAB — FERRITIN: Ferritin: 43.9 ng/mL (ref 10.0–291.0)

## 2020-09-19 LAB — TSH: TSH: 0.42 u[IU]/mL (ref 0.35–4.50)

## 2020-09-19 LAB — VITAMIN B12: Vitamin B-12: 803 pg/mL (ref 211–911)

## 2020-09-19 LAB — HEMOGLOBIN A1C: Hgb A1c MFr Bld: 6.1 % (ref 4.6–6.5)

## 2020-09-19 NOTE — Addendum Note (Signed)
Addended by: Janann Colonel on: 09/19/2020 09:47 AM   Modules accepted: Orders

## 2020-09-19 NOTE — Progress Notes (Signed)
Mary Brandt DOB: [birth year] Encounter date: 09/19/2020  This is a 35 y.o. female who presents with Chief Complaint  Patient presents with  . Follow-up    History of present illness: Today we addressed an acute back pain issue she was having. Doing better now.   Anxiety: Cymbalta 60 mg. States that mood has been good overall. A couple of months ago was not feeling as well; but doing better now.   History of gastric sleeve: At last visit we discussed that she was working with her husband to try to continue to lose weight, eat healthy, work on meal planning. States that weight hasn't been great. Last 6-8 mo have been rough with change in position at work - less stressful now and last couple of weeks has been better for her. Gotten back to structure and saying "no" to herself.   GERD: Omeprazole. Had such bad acid reflux last week, but states that she had a lot of tomato sauce at once.   History of hyperglycemia: Last A1c check was 6, which is improved from previous.  Iron deficiency: Suggested iron diet. She did increase to 2 tablets and she hasn't had side effects with that.    No Known Allergies Current Meds  Medication Sig  . DULoxetine (CYMBALTA) 60 MG capsule TAKE 1 CAPSULE BY MOUTH DAILY  . Ferrous Sulfate (IRON PO) Take by mouth daily. Take 2 tablets daily  . levonorgestrel (MIRENA) 20 MCG/24HR IUD 1 each by Intrauterine route once.  . Multiple Vitamins-Minerals (MULTIVITAMIN ADULT PO) Take 1 tablet by mouth daily.   Marland Kitchen omeprazole (PRILOSEC) 20 MG capsule TAKE 1 CAPSULE BY MOUTH ONCE A DAY    Review of Systems  Objective:  BP 110/80 (BP Location: Left Arm, Patient Position: Sitting, Cuff Size: Large)   Pulse 94   Temp 97.9 F (36.6 C) (Oral)   Ht 5\' 8"  (1.727 m)   Wt (!) 385 lb 3.2 oz (174.7 kg)   SpO2 96%   BMI 58.57 kg/m   Weight: (!) 385 lb 3.2 oz (174.7 kg)   BP Readings from Last 3 Encounters:  09/19/20 110/80  06/02/20 102/80  03/21/20 108/80   Wt  Readings from Last 3 Encounters:  09/19/20 (!) 385 lb 3.2 oz (174.7 kg)  06/02/20 (!) 379 lb 3.2 oz (172 kg)  03/21/20 (!) 372 lb 14.4 oz (169.1 kg)    Physical Exam Constitutional:      General: She is not in acute distress.    Appearance: She is well-developed.  Cardiovascular:     Rate and Rhythm: Normal rate and regular rhythm.     Heart sounds: Normal heart sounds. No murmur heard. No friction rub.  Pulmonary:     Effort: Pulmonary effort is normal. No respiratory distress.     Breath sounds: Normal breath sounds. No wheezing or rales.  Musculoskeletal:     Right lower leg: No edema.     Left lower leg: No edema.  Neurological:     Mental Status: She is alert and oriented to person, place, and time.  Psychiatric:        Behavior: Behavior normal.     Assessment/Plan  1. Morbid obesity ([place]) She is working on healthier choices and setting limitations.  2. Gastroesophageal reflux disease without esophagitis Continue with omeprazole. Avoid triggers - like tomatoes!  3. Hyperglycemia Recheck today; had been well controlled. - Hemoglobin A1c; Future  4. OSA (obstructive sleep apnea) She continues to use cpap nightly.   5.  Low serum iron Has been tolerating 2 iron tablets daily; recheck.  - CBC with Differential/Platelet; Future - Iron; Future - Ferritin; Future  6. Generalized anxiety disorder Stable on current dose of cymbalta.   7. Hyperlipidemia, unspecified hyperlipidemia type Has been stable.  - Comprehensive metabolic panel; Future - Lipid panel; Future - TSH; Future  8. Vitamin D deficiency - VITAMIN D 25 Hydroxy (Vit-D Deficiency, Fractures); Future  9. B12 deficiency Not currently supplementing.  - Vitamin B12; Future    Return in about 6 months (around 03/22/2021) for physical exam.    Micheline Rough, MD

## 2020-09-21 ENCOUNTER — Other Ambulatory Visit: Payer: Self-pay | Admitting: Family Medicine

## 2020-09-21 DIAGNOSIS — R748 Abnormal levels of other serum enzymes: Secondary | ICD-10-CM

## 2020-10-10 ENCOUNTER — Other Ambulatory Visit (HOSPITAL_COMMUNITY): Payer: Self-pay

## 2020-10-10 MED FILL — Omeprazole Cap Delayed Release 20 MG: ORAL | 90 days supply | Qty: 90 | Fill #0 | Status: AC

## 2020-10-14 ENCOUNTER — Other Ambulatory Visit (HOSPITAL_COMMUNITY): Payer: Self-pay

## 2020-10-18 ENCOUNTER — Encounter: Payer: Self-pay | Admitting: Family Medicine

## 2020-10-22 ENCOUNTER — Other Ambulatory Visit (HOSPITAL_COMMUNITY): Payer: Self-pay

## 2020-10-22 ENCOUNTER — Other Ambulatory Visit: Payer: Self-pay | Admitting: Family Medicine

## 2020-10-22 MED ORDER — OMEPRAZOLE 40 MG PO CPDR
40.0000 mg | DELAYED_RELEASE_CAPSULE | Freq: Every day | ORAL | 0 refills | Status: DC
Start: 2020-10-22 — End: 2021-01-30
  Filled 2020-10-22 – 2020-11-02 (×2): qty 90, 90d supply, fill #0

## 2020-10-25 ENCOUNTER — Other Ambulatory Visit: Payer: Self-pay

## 2020-10-25 ENCOUNTER — Other Ambulatory Visit (INDEPENDENT_AMBULATORY_CARE_PROVIDER_SITE_OTHER): Payer: 59

## 2020-10-25 DIAGNOSIS — R748 Abnormal levels of other serum enzymes: Secondary | ICD-10-CM

## 2020-10-25 LAB — HEPATIC FUNCTION PANEL
ALT: 17 U/L (ref 0–35)
AST: 12 U/L (ref 0–37)
Albumin: 3.8 g/dL (ref 3.5–5.2)
Alkaline Phosphatase: 92 U/L (ref 39–117)
Bilirubin, Direct: 0.1 mg/dL (ref 0.0–0.3)
Total Bilirubin: 0.3 mg/dL (ref 0.2–1.2)
Total Protein: 6.6 g/dL (ref 6.0–8.3)

## 2020-10-25 LAB — GAMMA GT: GGT: 73 U/L — ABNORMAL HIGH (ref 7–51)

## 2020-10-28 ENCOUNTER — Other Ambulatory Visit: Payer: Self-pay | Admitting: Family Medicine

## 2020-10-28 DIAGNOSIS — R748 Abnormal levels of other serum enzymes: Secondary | ICD-10-CM

## 2020-10-28 NOTE — Progress Notes (Signed)
Us ordered

## 2020-11-01 ENCOUNTER — Telehealth: Payer: 59 | Admitting: Physician Assistant

## 2020-11-01 ENCOUNTER — Other Ambulatory Visit (HOSPITAL_COMMUNITY): Payer: Self-pay

## 2020-11-01 DIAGNOSIS — J208 Acute bronchitis due to other specified organisms: Secondary | ICD-10-CM | POA: Diagnosis not present

## 2020-11-01 DIAGNOSIS — B9689 Other specified bacterial agents as the cause of diseases classified elsewhere: Secondary | ICD-10-CM

## 2020-11-01 MED ORDER — AZITHROMYCIN 250 MG PO TABS
ORAL_TABLET | ORAL | 0 refills | Status: DC
  Filled 2020-11-01: qty 6, 5d supply, fill #0

## 2020-11-01 MED ORDER — BENZONATATE 100 MG PO CAPS
100.0000 mg | ORAL_CAPSULE | Freq: Three times a day (TID) | ORAL | 0 refills | Status: DC | PRN
  Filled 2020-11-01: qty 30, 10d supply, fill #0

## 2020-11-01 MED ORDER — PREDNISONE 10 MG (21) PO TBPK
ORAL_TABLET | ORAL | 0 refills | Status: DC
  Filled 2020-11-01: qty 21, 6d supply, fill #0

## 2020-11-01 MED ORDER — ALBUTEROL SULFATE HFA 108 (90 BASE) MCG/ACT IN AERS
2.0000 | INHALATION_SPRAY | Freq: Four times a day (QID) | RESPIRATORY_TRACT | 0 refills | Status: DC | PRN
  Filled 2020-11-01: qty 18, 25d supply, fill #0

## 2020-11-01 NOTE — Progress Notes (Signed)
[name], Mary Brandt are scheduled for a virtual visit with your provider today.    Just as we do with appointments in the office, we must obtain your consent to participate.  Your consent will be active for this visit and any virtual visit you may have with one of our providers in the next 365 days.    If you have a MyChart account, I can also send a copy of this consent to you electronically.  All virtual visits are billed to your insurance company just like a traditional visit in the office.  As this is a virtual visit, video technology does not allow for your provider to perform a traditional examination.  This may limit your provider's ability to fully assess your condition.  If your provider identifies any concerns that need to be evaluated in person or the need to arrange testing such as labs, EKG, etc, we will make arrangements to do so.    Although advances in technology are sophisticated, we cannot ensure that it will always work on either your end or our end.  If the connection with a video visit is poor, we may have to switch to a telephone visit.  With either a video or telephone visit, we are not always able to ensure that we have a secure connection.   I need to obtain your verbal consent now.   Are you willing to proceed with your visit today?   Mary Brandt has provided verbal consent on 11/01/2020 for a virtual visit (video or telephone).   Mary Daring, PA-C 11/01/2020  7:09 PM  Virtual Visit Consent   Mary Brandt, you are scheduled for a virtual visit with a [place] provider today.     Just as with appointments in the office, your consent must be obtained to participate.  Your consent will be active for this visit and any virtual visit you may have with one of our providers in the next 365 days.     If you have a MyChart account, a copy of this consent can be sent to you electronically.  All virtual visits are billed to your insurance company just like a  traditional visit in the office.    As this is a virtual visit, video technology does not allow for your provider to perform a traditional examination.  This may limit your provider's ability to fully assess your condition.  If your provider identifies any concerns that need to be evaluated in person or the need to arrange testing (such as labs, EKG, etc.), we will make arrangements to do so.     Although advances in technology are sophisticated, we cannot ensure that it will always work on either your end or our end.  If the connection with a video visit is poor, the visit may have to be switched to a telephone visit.  With either a video or telephone visit, we are not always able to ensure that we have a secure connection.     I need to obtain your verbal consent now.   Are you willing to proceed with your visit today?    Mary Brandt has provided verbal consent on 11/01/2020 for a virtual visit (video or telephone).   Mary Daring, PA-C   Date: 11/01/2020 7:09 PM   Virtual Visit via Video Note   Mary Brandt, connected [email or web address]@ ([id number], Oct 20, 1985) on 11/01/20 at  7:00 PM EDT by a video-enabled telemedicine application and verified that I am  speaking with the correct person using two identifiers.  Location: Patient: Virtual Visit Location Patient: Home Provider: Virtual Visit Location Provider: Home Office   I discussed the limitations of evaluation and management by telemedicine and the availability of in person appointments. The patient expressed understanding and agreed to proceed.    History of Present Illness: Mary Brandt is a 35 y.o. who identifies as a female who was assigned female at birth, and is being seen today for upper respiratory symptoms. Covid testing x 3 all negative.  HPI: URI  This is a new problem. The current episode started 1 to 4 weeks ago (started last Tuesday). The problem has been unchanged. There has been no fever. Associated  symptoms include chest pain (pressure/tightness), congestion, coughing (dry), headaches, rhinorrhea, sinus pain and wheezing (feels tight at end of deep breath). Pertinent negatives include no ear pain, plugged ear sensation or sore throat. She has tried NSAIDs and decongestant (dayquil) for the symptoms. The treatment provided no relief.   Reports started feeling off last Tuesday, symptoms worsened on Friday. Symptoms started with head congestion/sinus symptoms. Now today has progressed into her chest.  Problems:  Patient Active Problem List   Diagnosis Date Noted   Nutritional counseling 03/14/2018   OSA (obstructive sleep apnea) 03/22/2016   Low serum iron 12/01/2015   Hyperglycemia 01/26/2014   Morbid obesity ([place]) 01/26/2014   GERD (gastroesophageal reflux disease) 01/26/2014   Generalized anxiety disorder 01/26/2014    Allergies: No Known Allergies Medications:  Current Outpatient Medications:    albuterol (VENTOLIN HFA) 108 (90 Base) MCG/ACT inhaler, Inhale 2 puffs into the lungs every 6 (six) hours as needed for wheezing or shortness of breath., Disp: 8 g, Rfl: 0   azithromycin (ZITHROMAX) 250 MG tablet, Take 2 tablets PO on day one, and one tablet PO daily thereafter until completed., Disp: 6 tablet, Rfl: 0   benzonatate (TESSALON) 100 MG capsule, Take 1 capsule (100 mg total) by mouth 3 (three) times daily as needed., Disp: 30 capsule, Rfl: 0   predniSONE (STERAPRED UNI-PAK 21 TAB) 10 MG (21) TBPK tablet, 6 day taper; take as directed on package instructions, Disp: 21 tablet, Rfl: 0   DULoxetine (CYMBALTA) 60 MG capsule, TAKE 1 CAPSULE BY MOUTH DAILY, Disp: 90 capsule, Rfl: 3   Ferrous Sulfate (IRON PO), Take by mouth daily. Take 2 tablets daily, Disp: , Rfl:    levonorgestrel (MIRENA) 20 MCG/24HR IUD, 1 each by Intrauterine route once., Disp: , Rfl:    Multiple Vitamins-Minerals (MULTIVITAMIN ADULT PO), Take 1 tablet by mouth daily. , Disp: , Rfl:    omeprazole (PRILOSEC) 40 MG  capsule, Take 1 capsule (40 mg total) by mouth daily., Disp: 90 capsule, Rfl: 0  Observations/Objective: Patient is well-developed, well-nourished in no acute distress.  Resting comfortably sitting on her couch at home.  Head is normocephalic, atraumatic.  No labored breathing. Did have some mild constriction sounds heard with a deep breath and forceful exhale, not a true wheeze or stridor appreciated. Speech is clear and coherent with logical content.  Patient is alert and oriented at baseline.   Assessment and Plan: 1. Acute bacterial bronchitis - azithromycin (ZITHROMAX) 250 MG tablet; Take 2 tablets PO on day one, and one tablet PO daily thereafter until completed.  Dispense: 6 tablet; Refill: 0 - predniSONE (STERAPRED UNI-PAK 21 TAB) 10 MG (21) TBPK tablet; 6 day taper; take as directed on package instructions  Dispense: 21 tablet; Refill: 0 - albuterol (VENTOLIN HFA) 108 (90  Base) MCG/ACT inhaler; Inhale 2 puffs into the lungs every 6 (six) hours as needed for wheezing or shortness of breath.  Dispense: 8 g; Refill: 0 - benzonatate (TESSALON) 100 MG capsule; Take 1 capsule (100 mg total) by mouth 3 (three) times daily as needed.  Dispense: 30 capsule; Refill: 0 -Worsening despite OTC treatments.  - Will treat with zpak, prednisone, albuterol, and tessalon perles.  - Push fluids.  - Rest.  - Seek in person evaluation if worsening or not improving.    Follow Up Instructions: I discussed the assessment and treatment plan with the patient. The patient was provided an opportunity to ask questions and all were answered. The patient agreed with the plan and demonstrated an understanding of the instructions.  A copy of instructions were sent to the patient via MyChart.  The patient was advised to call back or seek an in-person evaluation if the symptoms worsen or if the condition fails to improve as anticipated.  Time:  I spent 11 minutes with the patient via telehealth technology  discussing the above problems/concerns.    Mary Daring, PA-C

## 2020-11-01 NOTE — Patient Instructions (Signed)
Acute Bronchitis, Adult  Acute bronchitis is when air tubes in the lungs (bronchi) suddenly get swollen. The condition can make it hard for you to breathe. In adults, acute bronchitis usually goes away within 2 weeks. A cough caused by bronchitis may last up to 3 weeks. Smoking, allergies, and asthma can make thecondition worse. What are the causes? This condition is caused by: Cold and flu viruses. The most common cause of this condition is the virus that causes the common cold. Bacteria. Substances that irritate the lungs, including: Smoke from cigarettes and other types of tobacco. Dust and pollen. Fumes from chemicals, gases, or burned fuel. Other materials that pollute indoor or outdoor air. Close contact with someone who has acute bronchitis. What increases the risk? The following factors may make you more likely to develop this condition: A weak body's defense system. This is also called the immune system. Any condition that affects your lungs and breathing, such as asthma. What are the signs or symptoms? Symptoms of this condition include: A cough. Coughing up clear, yellow, or green mucus. Wheezing. Having too much mucus in your lungs (chest congestion). Shortness of breath. A fever. Chills. Body aches. A sore throat. How is this treated? Acute bronchitis may go away over time without treatment. Your doctor may recommend: Drinking more fluids. Using a device that gets medicine into your lungs (inhaler). Using a vaporizer or a humidifier. These are machines that add water or moisture to the air. This helps with coughing and poor breathing. Taking a medicine for fever. Taking a medicine that thins mucus and clears congestion. Taking a medicine that prevents or stops coughing. Follow these instructions at home: Activity Get a lot of rest. Return to your normal activities as told by your doctor. Ask your doctor what activities are safe for you. Lifestyle  Drink enough  fluid to keep your pee (urine) pale yellow. Do not drink alcohol. Do not use any products that contain nicotine or tobacco, such as cigarettes, e-cigarettes, and chewing tobacco. If you need help quitting, ask your doctor. Be aware that: Your bronchitis will get worse if you smoke or breathe in other people's smoke (secondhand smoke). Your lungs will heal faster if you quit smoking.  General instructions Take over-the-counter and prescription medicines only as told by your doctor. Use an inhaler, cool mist vaporizer, or humidifier as told by your doctor. Rinse your mouth often with salt water. To make salt water, dissolve -1 tsp (3-6 g) of salt in 1 cup (237 mL) of warm water. Take two teaspoons of honey at bedtime. This helps lessen your coughing at night. Keep all follow-up visits as told by your doctor. This is important. How is this prevented? To lower your risk of getting this condition again: Wash your hands often with soap and water. If you cannot use soap and water, use hand sanitizer. Avoid contact with people who have cold symptoms. Try not to touch your mouth, nose, or eyes with your hands. Make sure to get the flu shot every year. Contact a doctor if: Your symptoms do not get better in 2 weeks. You vomit more than once or twice. You have symptoms of loss of fluid from your body (dehydration). These include: Dark pee. Dry skin or eyes. Increased thirst. Headaches. Confusion. Muscle cramps. Get help right away if: You cough up blood. You have chest pain. You have very bad shortness of breath. You become dehydrated. You faint or keep feeling like you are going to faint. You   have a very bad headache. Your fever or chills get worse. These symptoms may be an emergency. Get help right away. Call your local emergency services (911 in the [place] Do not wait to see if the symptoms will go away. Do not drive yourself to the hospital. Summary Acute bronchitis is when air  tubes in the lungs (bronchi) suddenly get swollen. In adults, acute bronchitis usually goes away within 2 weeks. Take over-the-counter and prescription medicines only as told by your doctor. Drink enough fluid to keep your pee (urine) pale yellow. Contact a doctor if your symptoms do not improve after 2 weeks of treatment. Get help right away if you cough up blood, faint, or have chest pain or shortness of breath. This information is not intended to replace advice given to you by your health care provider. Make sure you discuss any questions you have with your healthcare provider. Document Revised: 03/23/2020 Document Reviewed: 11/14/2018 [employer] Patient Education  2022 [employer]  

## 2020-11-02 ENCOUNTER — Other Ambulatory Visit (HOSPITAL_COMMUNITY): Payer: Self-pay

## 2020-11-03 ENCOUNTER — Ambulatory Visit
Admission: RE | Admit: 2020-11-03 | Discharge: 2020-11-03 | Disposition: A | Discharge status: Home / Self Care | Payer: 59 | Source: Ambulatory Visit | Attending: Family Medicine | Admitting: Family Medicine

## 2020-11-03 DIAGNOSIS — R748 Abnormal levels of other serum enzymes: Secondary | ICD-10-CM

## 2020-11-10 ENCOUNTER — Other Ambulatory Visit: Payer: Self-pay | Admitting: *Deleted

## 2020-11-10 DIAGNOSIS — R748 Abnormal levels of other serum enzymes: Secondary | ICD-10-CM

## 2020-11-22 ENCOUNTER — Ambulatory Visit (HOSPITAL_BASED_OUTPATIENT_CLINIC_OR_DEPARTMENT_OTHER): Payer: 59 | Admitting: Obstetrics & Gynecology

## 2020-12-16 ENCOUNTER — Other Ambulatory Visit (HOSPITAL_COMMUNITY): Payer: Self-pay

## 2021-01-16 ENCOUNTER — Ambulatory Visit (HOSPITAL_BASED_OUTPATIENT_CLINIC_OR_DEPARTMENT_OTHER): Payer: 59 | Admitting: Obstetrics & Gynecology

## 2021-01-30 ENCOUNTER — Other Ambulatory Visit: Payer: Self-pay | Admitting: Family Medicine

## 2021-01-30 ENCOUNTER — Other Ambulatory Visit (HOSPITAL_COMMUNITY): Payer: Self-pay

## 2021-01-30 MED ORDER — OMEPRAZOLE 40 MG PO CPDR
40.0000 mg | DELAYED_RELEASE_CAPSULE | Freq: Every day | ORAL | 0 refills | Status: DC
  Filled 2021-01-30 – 2021-02-08 (×2): qty 90, 90d supply, fill #0

## 2021-02-07 ENCOUNTER — Other Ambulatory Visit (HOSPITAL_COMMUNITY): Payer: Self-pay

## 2021-02-08 ENCOUNTER — Other Ambulatory Visit (HOSPITAL_COMMUNITY): Payer: Self-pay

## 2021-03-10 ENCOUNTER — Encounter: Payer: Self-pay | Admitting: Family Medicine

## 2021-03-15 ENCOUNTER — Other Ambulatory Visit: Payer: Self-pay | Admitting: Family Medicine

## 2021-03-20 ENCOUNTER — Other Ambulatory Visit (HOSPITAL_COMMUNITY): Payer: Self-pay

## 2021-03-24 ENCOUNTER — Encounter: Payer: 59 | Admitting: Family Medicine

## 2021-04-10 ENCOUNTER — Other Ambulatory Visit: Payer: 59

## 2021-04-10 DIAGNOSIS — R748 Abnormal levels of other serum enzymes: Secondary | ICD-10-CM

## 2021-04-10 LAB — HEPATIC FUNCTION PANEL
ALT: 19 U/L (ref 0–35)
AST: 16 U/L (ref 0–37)
Albumin: 4 g/dL (ref 3.5–5.2)
Alkaline Phosphatase: 87 U/L (ref 39–117)
Bilirubin, Direct: 0.1 mg/dL (ref 0.0–0.3)
Total Bilirubin: 0.3 mg/dL (ref 0.2–1.2)
Total Protein: 7.3 g/dL (ref 6.0–8.3)

## 2021-04-10 LAB — GAMMA GT: GGT: 46 U/L (ref 7–51)

## 2021-04-17 ENCOUNTER — Encounter: Payer: Self-pay | Admitting: Family Medicine

## 2021-04-17 ENCOUNTER — Ambulatory Visit (INDEPENDENT_AMBULATORY_CARE_PROVIDER_SITE_OTHER): Payer: 59 | Admitting: Family Medicine

## 2021-04-17 VITALS — BP 112/80 | HR 76 | Temp 97.8°F | Ht 68.0 in | Wt >= 6400 oz

## 2021-04-17 DIAGNOSIS — K219 Gastro-esophageal reflux disease without esophagitis: Secondary | ICD-10-CM

## 2021-04-17 DIAGNOSIS — G4733 Obstructive sleep apnea (adult) (pediatric): Secondary | ICD-10-CM

## 2021-04-17 DIAGNOSIS — R7303 Prediabetes: Secondary | ICD-10-CM | POA: Insufficient documentation

## 2021-04-17 DIAGNOSIS — Z0000 Encounter for general adult medical examination without abnormal findings: Secondary | ICD-10-CM | POA: Diagnosis not present

## 2021-04-17 DIAGNOSIS — F411 Generalized anxiety disorder: Secondary | ICD-10-CM

## 2021-04-17 LAB — POCT GLYCOSYLATED HEMOGLOBIN (HGB A1C): Hemoglobin A1C: 6.2 % — AB (ref 4.0–5.6)

## 2021-04-17 MED ORDER — TIRZEPATIDE 5 MG/0.5ML ~~LOC~~ SOAJ: 5.0000 mg | SUBCUTANEOUS | 0 refills | Status: DC

## 2021-04-17 MED ORDER — TIRZEPATIDE 2.5 MG/0.5ML ~~LOC~~ SOAJ: 2.5000 mg | SUBCUTANEOUS | 0 refills | Status: DC

## 2021-04-17 NOTE — Progress Notes (Signed)
Mary Brandt DOB: Sep 04, 1985 Encounter date: 04/17/2021  This is a 35 y.o. female who presents for complete physical   History of present illness/Additional concerns:  Follows with [facility] [place] [facility]. Sees Melvia Heaps for gyn care; but she retired so now going to [employer].   Had episode of heart racing. Has had in past; about 1-2 times/year. Usually associated with alcohol or excessive caffeine intake, but this past episode wasn't associated with those.   She is interested in help with losing weight. Wait list for healthy weight program is very long and she would like to get started on glp-1 category medication after reading/learning about benefits.  She is always struggled with losing weight.  Knows how to eat healthy overall.  Her and her husband are good cooks.  She tends to have more difficulty with eating more in the evenings.  Past Medical History:  Diagnosis Date   Anxiety    Asthma    Hyperglycemia    Hyperlipemia    Morbid obesity ([place])    sees weight loss clinic   Nevus    sees dermatologist, Dr. Tonia Brooms   Obstructive sleep apnea on CPAP    Past Surgical History:  Procedure Laterality Date   finger laceration Right 2008   2nd finger   implanon insertion  06-26-11   left upper arm   LAPAROSCOPIC GASTRIC SLEEVE RESECTION N/A 01/05/2019   Procedure: LAPAROSCOPIC GASTRIC SLEEVE RESECTION, UPPER ENDO, ERAS Pathway;  Surgeon: Clovis Riley, MD;  Location: [facility];  Service: General;  Laterality: N/A;   MOLE REMOVAL     OTHER SURGICAL HISTORY Left [id number]    Implanon   WISDOM TOOTH EXTRACTION     No Known Allergies Current Meds  Medication Sig   DULoxetine (CYMBALTA) 60 MG capsule TAKE 1 CAPSULE BY MOUTH DAILY   levonorgestrel (MIRENA) 20 MCG/24HR IUD 1 each by Intrauterine route once.   Multiple Vitamins-Minerals (MULTIVITAMIN ADULT PO) Take 1 tablet by mouth daily.    omeprazole (PRILOSEC) 40 MG capsule Take 1 capsule (40 mg total) by mouth daily.    tirzepatide [facility]) 2.5 MG/0.5ML Pen Inject 2.5 mg into the skin once a week.   tirzepatide [facility]) 5 MG/0.5ML Pen Inject 5 mg into the skin once a week.   Social History   Tobacco Use   Smoking status: Never   Smokeless tobacco: Never  Substance Use Topics   Alcohol use: Not Currently   Family History  Problem Relation Age of Onset   Atrial fibrillation Mother    Diabetes Father    Cancer Father        skin   Polycystic ovary syndrome Sister    Depression Sister    Anxiety disorder Sister    Bipolar disorder Sister    Cancer Maternal Grandfather        skin   Dementia Maternal Grandmother      Review of Systems  Constitutional:  Negative for activity change, appetite change, chills, fatigue, fever and unexpected weight change.  HENT:  Negative for congestion, ear pain, hearing loss, sinus pressure, sinus pain, sore throat and trouble swallowing.   Eyes:  Negative for pain and visual disturbance.  Respiratory:  Negative for cough, chest tightness, shortness of breath and wheezing.   Cardiovascular:  Negative for chest pain, palpitations and leg swelling.  Gastrointestinal:  Negative for abdominal pain, blood in stool, constipation, diarrhea, nausea and vomiting.  Genitourinary:  Negative for difficulty urinating and menstrual problem.  Musculoskeletal:  Positive for  arthralgias (all joints are achy). Negative for back pain.  Skin:  Negative for rash.  Neurological:  Negative for dizziness, weakness, numbness and headaches.  Hematological:  Negative for adenopathy. Does not bruise/bleed easily.  Psychiatric/Behavioral:  Negative for sleep disturbance and suicidal ideas. The patient is not nervous/anxious.    CBC:  Lab Results  Component Value Date   WBC 8.9 09/19/2020   HGB 13.3 09/19/2020   HCT 41.2 09/19/2020   MCH 27.1 01/06/2019   MCHC 32.4 09/19/2020   RDW 15.6 (H) 09/19/2020   PLT 342.0 09/19/2020   CMP: Lab Results  Component Value Date   NA 141  09/19/2020   K 4.2 09/19/2020   CL 102 09/19/2020   CO2 29 09/19/2020   ANIONGAP 12 01/06/2019   GLUCOSE 103 (H) 09/19/2020   BUN 16 09/19/2020   CREATININE 0.80 09/19/2020   CREATININE 0.89 10/05/2013   GFRAA >60 01/06/2019   CALCIUM 9.3 09/19/2020   PROT 7.3 04/10/2021   BILITOT 0.3 04/10/2021   ALKPHOS 87 04/10/2021   ALT 19 04/10/2021   AST 16 04/10/2021   LIPID: Lab Results  Component Value Date   CHOL 149 09/19/2020   TRIG 150.0 (H) 09/19/2020   HDL 47.10 09/19/2020   LDLCALC 72 09/19/2020    Objective:  BP 112/80 (BP Location: Left Arm, Patient Position: Sitting, Cuff Size: Large)   Pulse 76   Temp 97.8 F (36.6 C) (Oral)   Ht 5\' 8"  (1.727 m)   Wt (!) 408 lb 1.6 oz (185.1 kg)   SpO2 97%   BMI 62.05 kg/m   Weight: (!) 408 lb 1.6 oz (185.1 kg)   BP Readings from Last 3 Encounters:  04/17/21 112/80  09/19/20 110/80  06/02/20 102/80   Wt Readings from Last 3 Encounters:  04/17/21 (!) 408 lb 1.6 oz (185.1 kg)  09/19/20 (!) 385 lb 3.2 oz (174.7 kg)  06/02/20 (!) 379 lb 3.2 oz (172 kg)    Physical Exam Constitutional:      General: She is not in acute distress.    Appearance: She is well-developed. She is obese.  HENT:     Head: Normocephalic and atraumatic.     Right Ear: External ear normal.     Left Ear: External ear normal.     Mouth/Throat:     Pharynx: No oropharyngeal exudate.  Eyes:     Conjunctiva/sclera: Conjunctivae normal.     Pupils: Pupils are equal, round, and reactive to light.  Neck:     Thyroid: No thyromegaly.  Cardiovascular:     Rate and Rhythm: Normal rate and regular rhythm.     Heart sounds: Normal heart sounds. No murmur heard.   No friction rub. No gallop.  Pulmonary:     Effort: Pulmonary effort is normal.     Breath sounds: Normal breath sounds.  Abdominal:     General: Bowel sounds are normal. There is no distension.     Palpations: Abdomen is soft. There is no mass.     Tenderness: There is no abdominal  tenderness. There is no guarding.     Hernia: No hernia is present.  Musculoskeletal:        General: No tenderness or deformity. Normal range of motion.     Cervical back: Normal range of motion and neck supple.  Lymphadenopathy:     Cervical: No cervical adenopathy.  Skin:    General: Skin is warm and dry.     Findings: No rash.  Neurological:  Mental Status: She is alert and oriented to person, place, and time.     Deep Tendon Reflexes: Reflexes normal.     Reflex Scores:      Tricep reflexes are 2+ on the right side and 2+ on the left side.      Bicep reflexes are 2+ on the right side and 2+ on the left side.      Brachioradialis reflexes are 2+ on the right side and 2+ on the left side.      Patellar reflexes are 2+ on the right side and 2+ on the left side. Psychiatric:        Speech: Speech normal.        Behavior: Behavior normal.        Thought Content: Thought content normal.    Assessment/Plan: There are no preventive care reminders to display for this patient.  Health Maintenance reviewed.  1. Preventative health care Discussed getting back into regular routine of activity, even if for short bursts of time.  Discussed that this is helpful not only for energy, metabolism, weight loss, but also her blood sugar control.  2. OSA (obstructive sleep apnea) Continue use of CPAP nightly.  3. Gastroesophageal reflux disease without esophagitis Continues with Prilosec 40 mg daily.  4. Morbid obesity ([place]) We are going to add Mounjaro and titrate up dose as tolerated to goal dose. Discussed new medication(s) today with patient. Discussed potential side effects and patient verbalized understanding. Rx printed today. She has already accessed savings card.   5. Generalized anxiety disorder Continue with cymbalta. She feels that this has been helpful for her for mood overall.   6. Prediabetes See above. Recheck still in prediabetic range. I do feel that she needs/will  benefit from GLP 1 to help with sugar control as well as weight loss.  - POCT glycosylated hemoglobin (Hb A1C)   Return in about 2 months (around 06/18/2021).  Micheline Rough, MD

## 2021-05-04 ENCOUNTER — Other Ambulatory Visit: Payer: Self-pay | Admitting: Family Medicine

## 2021-05-04 ENCOUNTER — Other Ambulatory Visit (HOSPITAL_COMMUNITY): Payer: Self-pay

## 2021-05-04 MED ORDER — MOUNJARO 2.5 MG/0.5ML ~~LOC~~ SOAJ
SUBCUTANEOUS | 0 refills | Status: DC
  Filled 2021-05-04 – 2021-05-17 (×2): qty 2, 28d supply, fill #0

## 2021-05-04 MED ORDER — MOUNJARO 5 MG/0.5ML ~~LOC~~ SOAJ: SUBCUTANEOUS | 0 refills | Status: DC

## 2021-05-04 MED ORDER — OMEPRAZOLE 40 MG PO CPDR
40.0000 mg | DELAYED_RELEASE_CAPSULE | Freq: Every day | ORAL | 0 refills | Status: DC
  Filled 2021-05-04: qty 90, 90d supply, fill #0

## 2021-05-09 ENCOUNTER — Other Ambulatory Visit (HOSPITAL_COMMUNITY): Payer: Self-pay

## 2021-05-15 ENCOUNTER — Other Ambulatory Visit: Payer: 59

## 2021-05-17 ENCOUNTER — Other Ambulatory Visit (HOSPITAL_COMMUNITY): Payer: Self-pay

## 2021-05-24 ENCOUNTER — Encounter: Payer: Self-pay | Admitting: Family Medicine

## 2021-05-24 ENCOUNTER — Other Ambulatory Visit (HOSPITAL_COMMUNITY): Payer: Self-pay

## 2021-05-24 NOTE — Telephone Encounter (Signed)
[facility] states that it [employer] 2.5mg  was rejected b/c they prefer step therapy with Ozempic, Rybelsus, or Trulicity 1st before Mounjaro (likely due to being new on market). All of the other meds listed above will still require a PA, however, the insurance company prefers those meds. If prescribed the PA can be completed online without registration at [email or web address].

## 2021-06-13 ENCOUNTER — Other Ambulatory Visit (HOSPITAL_COMMUNITY): Payer: Self-pay

## 2021-06-19 ENCOUNTER — Other Ambulatory Visit (HOSPITAL_COMMUNITY): Payer: Self-pay

## 2021-06-19 ENCOUNTER — Ambulatory Visit: Payer: 59 | Admitting: Family Medicine

## 2021-06-21 ENCOUNTER — Ambulatory Visit (INDEPENDENT_AMBULATORY_CARE_PROVIDER_SITE_OTHER): Payer: 59 | Admitting: Family Medicine

## 2021-06-21 ENCOUNTER — Other Ambulatory Visit (HOSPITAL_COMMUNITY): Payer: Self-pay

## 2021-06-21 ENCOUNTER — Encounter: Payer: Self-pay | Admitting: Family Medicine

## 2021-06-21 DIAGNOSIS — G4733 Obstructive sleep apnea (adult) (pediatric): Secondary | ICD-10-CM

## 2021-06-21 DIAGNOSIS — D649 Anemia, unspecified: Secondary | ICD-10-CM

## 2021-06-21 DIAGNOSIS — K219 Gastro-esophageal reflux disease without esophagitis: Secondary | ICD-10-CM

## 2021-06-21 DIAGNOSIS — R7303 Prediabetes: Secondary | ICD-10-CM

## 2021-06-21 DIAGNOSIS — R748 Abnormal levels of other serum enzymes: Secondary | ICD-10-CM | POA: Diagnosis not present

## 2021-06-21 DIAGNOSIS — F411 Generalized anxiety disorder: Secondary | ICD-10-CM

## 2021-06-21 DIAGNOSIS — E538 Deficiency of other specified B group vitamins: Secondary | ICD-10-CM

## 2021-06-21 DIAGNOSIS — E611 Iron deficiency: Secondary | ICD-10-CM | POA: Diagnosis not present

## 2021-06-21 MED ORDER — OMEPRAZOLE 40 MG PO CPDR
40.0000 mg | DELAYED_RELEASE_CAPSULE | Freq: Every day | ORAL | 3 refills | Status: DC
  Filled 2021-06-21 – 2021-08-07 (×2): qty 90, 90d supply, fill #0
  Filled 2021-11-01: qty 90, 90d supply, fill #1
  Filled 2022-01-30: qty 90, 90d supply, fill #2
  Filled 2022-04-30: qty 90, 90d supply, fill #3

## 2021-06-21 MED ORDER — DULOXETINE HCL 60 MG PO CPEP
ORAL_CAPSULE | Freq: Every day | ORAL | 3 refills | Status: DC
  Filled 2021-06-23: qty 90, 90d supply, fill #0
  Filled 2021-09-18: qty 90, 90d supply, fill #1
  Filled 2021-12-15: qty 90, 90d supply, fill #2

## 2021-06-21 MED ORDER — SEMAGLUTIDE-WEIGHT MANAGEMENT 0.25 MG/0.5ML ~~LOC~~ SOAJ
0.2500 mg | SUBCUTANEOUS | 2 refills | Status: AC
  Filled 2021-06-21: qty 2, 30d supply, fill #0

## 2021-06-21 NOTE — Progress Notes (Signed)
Mary Brandt DOB: 1985-10-05 Encounter date: 06/21/2021  This is a 36 y.o. female who presents with Chief Complaint  Patient presents with   Follow-up    History of present illness: Last visit was 04/17/2021.  We added Mounjaro at that time, but insurance didn't approve this. We discussed alternatives for treatment for obesity since she wasn't diabetic with last testing.   GERD: compliant with omeprazole daily. She had sx on 20mg . She is better now and doesn't miss dose.  Anxiety: cymbalta working well. Due for refill in a few months.   Sometimes has some eye issues happening. Sometimes issues with clarity of vision lately. Rx up to date, doing good job with taking out contacts regularly. Notes this issue all the time.   She went to give blood a couple of weeks ago and iron was too low. Took supplements for a few days, but non since.  No Known Allergies Current Meds  Medication Sig   levonorgestrel (MIRENA) 20 MCG/24HR IUD 1 each by Intrauterine route once.   Multiple Vitamins-Minerals (MULTIVITAMIN ADULT PO) Take 1 tablet by mouth daily.    Semaglutide-Weight Management 0.25 MG/0.5ML SOAJ Inject 0.25 mg into the skin once a week for 28 days.   [DISCONTINUED] DULoxetine (CYMBALTA) 60 MG capsule TAKE 1 CAPSULE BY MOUTH DAILY   [DISCONTINUED] omeprazole (PRILOSEC) 40 MG capsule Take 1 capsule (40 mg total) by mouth daily.    Review of Systems  Constitutional:  Negative for chills, fatigue and fever.  Respiratory:  Negative for cough, chest tightness, shortness of breath and wheezing.   Cardiovascular:  Negative for chest pain, palpitations and leg swelling.   Objective:  BP 102/70 (BP Location: Left Arm, Patient Position: Sitting, Cuff Size: Large)    Pulse 91    Temp 97.9 F (36.6 C) (Oral)    Ht 5\' 8"  (1.727 m)    Wt (!) 411 lb 8 oz (186.7 kg)    SpO2 96%    BMI 62.57 kg/m   Weight: (!) 411 lb 8 oz (186.7 kg)   BP Readings from Last 3 Encounters:  06/21/21 102/70   04/17/21 112/80  09/19/20 110/80   Wt Readings from Last 3 Encounters:  06/21/21 (!) 411 lb 8 oz (186.7 kg)  04/17/21 (!) 408 lb 1.6 oz (185.1 kg)  09/19/20 (!) 385 lb 3.2 oz (174.7 kg)    Physical Exam Constitutional:      General: She is not in acute distress.    Appearance: She is well-developed. She is obese.  Cardiovascular:     Rate and Rhythm: Normal rate and regular rhythm.     Heart sounds: Normal heart sounds. No murmur heard.   No friction rub.  Pulmonary:     Effort: Pulmonary effort is normal. No respiratory distress.     Breath sounds: Normal breath sounds. No wheezing or rales.  Musculoskeletal:     Right lower leg: No edema.     Left lower leg: No edema.  Neurological:     Mental Status: She is alert and oriented to person, place, and time.  Psychiatric:        Behavior: Behavior normal.    Assessment/Plan  1. Morbid obesity ([place]) She is going to try [facility].  I really do feel she will benefit from this category medication, especially given comorbidities including prediabetes.  She was instructed about how to get a savings card through insurance to help with cost of medication. - Semaglutide-Weight Management 0.25 MG/0.5ML SOAJ; Inject 0.25 mg  into the skin once a week for 28 days.  Dispense: 2 mL; Refill: 2  2. OSA (obstructive sleep apnea) Continue with CPAP. - Semaglutide-Weight Management 0.25 MG/0.5ML SOAJ; Inject 0.25 mg into the skin once a week for 28 days.  Dispense: 2 mL; Refill: 2  3. Prediabetes Continue to work on healthy eating, portion control, low carbohydrate diet. - Semaglutide-Weight Management 0.25 MG/0.5ML SOAJ; Inject 0.25 mg into the skin once a week for 28 days.  Dispense: 2 mL; Refill: 2  4. Generalized anxiety disorder Mood has been stable on current dose of Cymbalta.  Continue this. - DULoxetine (CYMBALTA) 60 MG capsule; TAKE 1 CAPSULE BY MOUTH DAILY  Dispense: 90 capsule; Refill: 3  5. Gastroesophageal reflux disease without  esophagitis She is done well on the 40 mg.  Has symptoms with decreased doses.  We discussed that reflux symptoms may improve as she has reduction in weight. - omeprazole (PRILOSEC) 40 MG capsule; Take 1 capsule (40 mg total) by mouth daily.  Dispense: 90 capsule; Refill: 3  6. Anemia, unspecified type History of anemia.  She was unable to recently donate blood due to anemia.  We will check some blood work today.  7. Iron deficiency Encouraged her to take iron supplementation if donating blood for at least a week after donation.  Check lab work today. - CBC with Differential/Platelet; Future - IBC + Ferritin; Future - IBC + Ferritin - CBC with Differential/Platelet  8. B12 deficiency Stop supplementing B12 due to levels being high status post bariatric surgery when she was taking regular supplementation. - Vitamin B12; Future - Folate; Future - Homocysteine; Future - Methylmalonic acid, serum; Future - Methylmalonic acid, serum - Homocysteine - Folate - Vitamin B12  9. Elevated liver enzymes - Comprehensive metabolic panel; Future - Comprehensive metabolic panel   Return in about 3 months (around 09/18/2021) for Chronic condition visit.     Micheline Rough, MD

## 2021-06-22 ENCOUNTER — Other Ambulatory Visit (HOSPITAL_COMMUNITY): Payer: Self-pay

## 2021-06-22 LAB — CBC WITH DIFFERENTIAL/PLATELET
Basophils Absolute: 0.2 10*3/uL — ABNORMAL HIGH (ref 0.0–0.1)
Basophils Relative: 1.7 % (ref 0.0–3.0)
Eosinophils Absolute: 0.2 10*3/uL (ref 0.0–0.7)
Eosinophils Relative: 2.2 % (ref 0.0–5.0)
HCT: 34.1 % — ABNORMAL LOW (ref 36.0–46.0)
Hemoglobin: 10.2 g/dL — ABNORMAL LOW (ref 12.0–15.0)
Lymphocytes Relative: 29 % (ref 12.0–46.0)
Lymphs Abs: 3.2 10*3/uL (ref 0.7–4.0)
MCHC: 30 g/dL (ref 30.0–36.0)
MCV: 73.5 fl — ABNORMAL LOW (ref 78.0–100.0)
Monocytes Absolute: 0.7 10*3/uL (ref 0.1–1.0)
Monocytes Relative: 6.2 % (ref 3.0–12.0)
Neutro Abs: 6.7 10*3/uL (ref 1.4–7.7)
Neutrophils Relative %: 60.9 % (ref 43.0–77.0)
Platelets: 504 10*3/uL — ABNORMAL HIGH (ref 150.0–400.0)
RBC: 4.64 Mil/uL (ref 3.87–5.11)
RDW: 18.7 % — ABNORMAL HIGH (ref 11.5–15.5)
WBC: 10.9 10*3/uL — ABNORMAL HIGH (ref 4.0–10.5)

## 2021-06-22 LAB — COMPREHENSIVE METABOLIC PANEL
ALT: 18 U/L (ref 0–35)
AST: 16 U/L (ref 0–37)
Albumin: 4.2 g/dL (ref 3.5–5.2)
Alkaline Phosphatase: 98 U/L (ref 39–117)
BUN: 23 mg/dL (ref 6–23)
CO2: 32 mEq/L (ref 19–32)
Calcium: 9.4 mg/dL (ref 8.4–10.5)
Chloride: 103 mEq/L (ref 96–112)
Creatinine, Ser: 0.82 mg/dL (ref 0.40–1.20)
GFR: 91.94 mL/min (ref >60.00)
Glucose, Bld: 82 mg/dL (ref 70–99)
Potassium: 4.2 mEq/L (ref 3.5–5.1)
Sodium: 140 mEq/L (ref 135–145)
Total Bilirubin: 0.3 mg/dL (ref 0.2–1.2)
Total Protein: 7.9 g/dL (ref 6.0–8.3)

## 2021-06-22 LAB — IBC + FERRITIN
Ferritin: 12.7 ng/mL (ref 10.0–291.0)
Iron: 23 ug/dL — ABNORMAL LOW (ref 42–145)
Saturation Ratios: 4.6 % — ABNORMAL LOW (ref 20.0–50.0)
TIBC: 505.4 ug/dL — ABNORMAL HIGH (ref 250.0–450.0)
Transferrin: 361 mg/dL — ABNORMAL HIGH (ref 212.0–360.0)

## 2021-06-22 LAB — VITAMIN B12: Vitamin B-12: 413 pg/mL (ref 211–911)

## 2021-06-22 LAB — FOLATE: Folate: 9.3 ng/mL (ref >5.9)

## 2021-06-23 ENCOUNTER — Other Ambulatory Visit (HOSPITAL_COMMUNITY): Payer: Self-pay

## 2021-06-24 LAB — EXTRA SPECIMEN

## 2021-06-24 LAB — METHYLMALONIC ACID, SERUM: Methylmalonic Acid, Quant: 119 nmol/L (ref 87–318)

## 2021-06-24 LAB — HOMOCYSTEINE: Homocysteine: 10.1 umol/L (ref <10.4)

## 2021-06-28 ENCOUNTER — Encounter: Payer: Self-pay | Admitting: Family Medicine

## 2021-06-29 ENCOUNTER — Other Ambulatory Visit (HOSPITAL_COMMUNITY): Payer: Self-pay

## 2021-07-03 ENCOUNTER — Other Ambulatory Visit (HOSPITAL_COMMUNITY): Payer: Self-pay

## 2021-08-07 ENCOUNTER — Other Ambulatory Visit (HOSPITAL_COMMUNITY): Payer: Self-pay

## 2021-09-15 NOTE — Progress Notes (Signed)
? ?Established Patient Office Visit ? ?Subjective   ?Patient ID: Mary Brandt, female    DOB: 10-Nov-1985  Age: 36 y.o. MRN: [id number] ? ?Chief Complaint  ?Patient presents with  ? Transitions Of Care  ?  TOC. Est care. Med review/refill   ? ? ?HPI ? ?Mary Brandt presents to establish care with a new provider.  Introduced to Designer, jewellery role and practice setting.  All questions answered.  Discussed provider/patient relationship and expectations. ? ?She has a history of anxiety. She was seeing a psychiatrist and was started on duloxetine. She has been taking this medication for 10 years and is stable.  She is possibly interested in coming off of the medication at some point in the future, however not today.  She denies SI/HI. ? ?She has a history of OSA. She wears CPAP and recently has been snoring still with mask on. She sees pulmonology but hasn't seen them in a while.  She thinks that she may need her CPAP settings adjusted. ? ? ?  09/18/2021  ?  1:23 PM 06/21/2021  ?  3:10 PM 04/17/2021  ? 12:36 PM 09/19/2020  ?  9:52 AM 08/28/2019  ?  3:38 PM  ?Depression screen PHQ 2/9  ?Decreased Interest 0 0 0 0 0  ?Down, Depressed, Hopeless 1 0 0 0 0  ?PHQ - 2 Score 1 0 0 0 0  ?Altered sleeping 1 0 0 1   ?Tired, decreased energy 0 0 0 1   ?Change in appetite 0 0 0 1   ?Feeling bad or failure about yourself  1 0 0 1   ?Trouble concentrating 0 0 0 0   ?Moving slowly or fidgety/restless 0 0 0 0   ?Suicidal thoughts 0 0 0 0   ?PHQ-9 Score 3 0 0 4   ?Difficult doing work/chores Not difficult at all      ? ? ?  09/18/2021  ?  1:23 PM  ?GAD 7 : Generalized Anxiety Score  ?Nervous, Anxious, on Edge 1  ?Control/stop worrying 1  ?Worry too much - different things 1  ?Trouble relaxing 0  ?Restless 0  ?Easily annoyed or irritable 1  ?Afraid - awful might happen 0  ?Total GAD 7 Score 4  ?Anxiety Difficulty Not difficult at all  ? ? ?Past Medical History:  ?Diagnosis Date  ? Anxiety   ? Asthma   ? Hyperglycemia   ? Hyperlipemia    ? Morbid obesity ([place])   ? sees weight loss clinic  ? [facility]   ? sees dermatologist, Dr. Tonia Brooms  ? Obstructive sleep apnea on CPAP   ? ?Past Surgical History:  ?Procedure Laterality Date  ? finger laceration Right 2008  ? 2nd finger  ? implanon insertion  06-26-11  ? left upper arm  ? LAPAROSCOPIC GASTRIC SLEEVE RESECTION N/A 01/05/2019  ? Procedure: LAPAROSCOPIC GASTRIC SLEEVE RESECTION, UPPER ENDO, ERAS Pathway;  Surgeon: Clovis Riley, MD;  Location: [facility];  Service: General;  Laterality: N/A;  ? MOLE REMOVAL    ? OTHER SURGICAL HISTORY Left [id number]   ? Implanon  ? WISDOM TOOTH EXTRACTION    ? ?Social History  ? ?Tobacco Use  ? Smoking status: Never  ? Smokeless tobacco: Never  ?Vaping Use  ? Vaping Use: Never used  ?Substance Use Topics  ? Alcohol use: Not Currently  ? Drug use: No  ? ?Family History  ?Problem Relation Age of Onset  ? Atrial fibrillation Mother   ? Diabetes Father   ?  Cancer Father   ?     skin  ? Polycystic ovary syndrome Sister   ? Depression Sister   ? Anxiety disorder Sister   ? Bipolar disorder Sister   ? Cancer Maternal Grandfather   ?     skin  ? Dementia Maternal Grandmother   ? ?Review of Systems  ?Constitutional: Negative.   ?HENT:  Positive for congestion. Negative for ear pain and sore throat.   ?Eyes: Negative.   ?Respiratory: Negative.    ?Cardiovascular: Negative.   ?Gastrointestinal: Negative.   ?Genitourinary: Negative.   ?Musculoskeletal: Negative.   ?Skin: Negative.   ?Neurological: Negative.   ?Endo/Heme/Allergies:  Positive for environmental allergies.  ?Psychiatric/Behavioral: Negative.    ? ?  ?Objective:  ?  ? ?BP 137/79 (BP Location: Right Arm, Patient Position: Sitting) Comment (Cuff Size): XXL  Pulse 87   Temp 97.7 ?F (36.5 ?C) (Temporal)   Ht '5\' 8"'$  (1.727 m)   Wt (!) 408 lb 6.4 oz (185.2 kg)   SpO2 97%   BMI 62.10 kg/m?  ?BP Readings from Last 3 Encounters:  ?09/18/21 137/79  ?06/21/21 102/70  ?04/17/21 112/80  ? ?Wt Readings from Last 3 Encounters:   ?09/18/21 (!) 408 lb 6.4 oz (185.2 kg)  ?06/21/21 (!) 411 lb 8 oz (186.7 kg)  ?04/17/21 (!) 408 lb 1.6 oz (185.1 kg)  ? ?  ? ?Physical Exam ?Vitals and nursing note reviewed.  ?Constitutional:   ?   General: She is not in acute distress. ?   Appearance: Normal appearance. She is obese.  ?HENT:  ?   Head: Normocephalic.  ?   Right Ear: Tympanic membrane, ear canal and external ear normal.  ?   Left Ear: Tympanic membrane, ear canal and external ear normal.  ?Eyes:  ?   Conjunctiva/sclera: Conjunctivae normal.  ?Cardiovascular:  ?   Rate and Rhythm: Normal rate and regular rhythm.  ?   Pulses: Normal pulses.  ?   Heart sounds: Normal heart sounds.  ?Pulmonary:  ?   Effort: Pulmonary effort is normal.  ?   Breath sounds: Normal breath sounds.  ?Abdominal:  ?   Palpations: Abdomen is soft.  ?   Tenderness: There is no abdominal tenderness.  ?Musculoskeletal:     ?   General: Normal range of motion.  ?   Cervical back: Normal range of motion and neck supple. No tenderness.  ?   Right lower leg: No edema.  ?   Left lower leg: No edema.  ?Lymphadenopathy:  ?   Cervical: No cervical adenopathy.  ?Skin: ?   General: Skin is warm.  ?Neurological:  ?   General: No focal deficit present.  ?   Mental Status: She is alert and oriented to person, place, and time.  ?Psychiatric:     ?   Mood and Affect: Mood normal.     ?   Behavior: Behavior normal.     ?   Thought Content: Thought content normal.     ?   Judgment: Judgment normal.  ? ? ?No results found for any visits on 09/18/21. ? ?Last CBC ?Lab Results  ?Component Value Date  ? WBC 10.9 (H) 06/21/2021  ? HGB 10.2 (L) 06/21/2021  ? HCT 34.1 (L) 06/21/2021  ? MCV 73.5 (L) 06/21/2021  ? MCH 27.1 01/06/2019  ? RDW 18.7 (H) 06/21/2021  ? PLT 504.0 (H) 06/21/2021  ? ?Last metabolic panel ?Lab Results  ?Component Value Date  ? GLUCOSE 82 06/21/2021  ?  NA 140 06/21/2021  ? K 4.2 06/21/2021  ? CL 103 06/21/2021  ? CO2 32 06/21/2021  ? BUN 23 06/21/2021  ? CREATININE 0.82 06/21/2021  ?  GFRNONAA >60 01/06/2019  ? CALCIUM 9.4 06/21/2021  ? PROT 7.9 06/21/2021  ? ALBUMIN 4.2 06/21/2021  ? BILITOT 0.3 06/21/2021  ? ALKPHOS 98 06/21/2021  ? AST 16 06/21/2021  ? ALT 18 06/21/2021  ? ANIONGAP 12 01/06/2019  ? ?Last lipids ?Lab Results  ?Component Value Date  ? CHOL 149 09/19/2020  ? HDL 47.10 09/19/2020  ? [place] 72 09/19/2020  ? TRIG 150.0 (H) 09/19/2020  ? CHOLHDL 3 09/19/2020  ? ?Last hemoglobin A1c ?Lab Results  ?Component Value Date  ? HGBA1C 6.2 (A) 04/17/2021  ? ?Last thyroid functions ?Lab Results  ?Component Value Date  ? TSH 0.42 09/19/2020  ? ?Last vitamin D ?Lab Results  ?Component Value Date  ? VD25OH 32.02 09/19/2020  ? ?Last vitamin B12 and Folate ?Lab Results  ?Component Value Date  ? [id number] 413 06/21/2021  ? FOLATE 9.3 06/21/2021  ? ? ?  ?Assessment & Plan:  ? ?Problem List Items Addressed This Visit   ? ?  ? Respiratory  ? OSA (obstructive sleep apnea) - Primary  ?  She has a history of OSA and is using a CPAP every night.  She states that recently she has been snoring a little bit even with the mask on.  Encouraged her to reach out to her pulmonologist to see if her CPAP settings need to be adjusted. ? ?  ?  ?  ? Digestive  ? GERD (gastroesophageal reflux disease)  ?  Chronic, stable.  She is taking omeprazole 40 mg daily.  She does not need any refills today.  Follow-up with worsening symptoms or concerns. ? ?  ?  ?  ? Other  ? Morbid obesity ([place])  ?  BMI is 62.1.  She states that she has a history of a gastric sleeve surgery and has lost about 60 pounds with this.  She states that she has recently put on some of this weight back, however she is working on maintaining a healthy diet and losing weight.  Follow-up with any concerns. ? ?  ?  ? Generalized anxiety disorder  ?  Chronic, stable.  She currently takes duloxetine 60 mg daily and her symptoms are well controlled.  Continue this medication.  No refills needed today.  Follow-up in 6 months ? ?  ?  ? Low serum iron  ?  She  donates blood frequently and has had low iron counts recently.  She has started taking an iron supplement once a day and has not had any side effects from this.  We will check CBC, iron panel today. ? ?  ?  ? [place]

## 2021-09-18 ENCOUNTER — Encounter: Payer: 59 | Admitting: Nurse Practitioner

## 2021-09-18 ENCOUNTER — Other Ambulatory Visit (HOSPITAL_COMMUNITY): Payer: Self-pay

## 2021-09-18 ENCOUNTER — Encounter: Payer: Self-pay | Admitting: Nurse Practitioner

## 2021-09-18 ENCOUNTER — Ambulatory Visit: Payer: 59 | Admitting: Family Medicine

## 2021-09-18 ENCOUNTER — Ambulatory Visit (INDEPENDENT_AMBULATORY_CARE_PROVIDER_SITE_OTHER): Payer: 59 | Admitting: Nurse Practitioner

## 2021-09-18 VITALS — BP 137/79 | HR 87 | Temp 97.7°F | Ht 68.0 in | Wt >= 6400 oz

## 2021-09-18 DIAGNOSIS — E611 Iron deficiency: Secondary | ICD-10-CM

## 2021-09-18 DIAGNOSIS — F411 Generalized anxiety disorder: Secondary | ICD-10-CM

## 2021-09-18 DIAGNOSIS — G4733 Obstructive sleep apnea (adult) (pediatric): Secondary | ICD-10-CM | POA: Diagnosis not present

## 2021-09-18 DIAGNOSIS — R7303 Prediabetes: Secondary | ICD-10-CM

## 2021-09-18 DIAGNOSIS — K219 Gastro-esophageal reflux disease without esophagitis: Secondary | ICD-10-CM

## 2021-09-18 LAB — CBC WITH DIFFERENTIAL/PLATELET
Basophils Absolute: 0 10*3/uL (ref 0.0–0.1)
Basophils Relative: 0.4 % (ref 0.0–3.0)
Eosinophils Absolute: 0.1 10*3/uL (ref 0.0–0.7)
Eosinophils Relative: 0.9 % (ref 0.0–5.0)
HCT: 37.8 % (ref 36.0–46.0)
Hemoglobin: 11.6 g/dL — ABNORMAL LOW (ref 12.0–15.0)
Lymphocytes Relative: 22 % (ref 12.0–46.0)
Lymphs Abs: 2.6 10*3/uL (ref 0.7–4.0)
MCHC: 30.8 g/dL (ref 30.0–36.0)
MCV: 78.5 fl (ref 78.0–100.0)
Monocytes Absolute: 0.9 10*3/uL (ref 0.1–1.0)
Monocytes Relative: 7.8 % (ref 3.0–12.0)
Neutro Abs: 8.1 10*3/uL — ABNORMAL HIGH (ref 1.4–7.7)
Neutrophils Relative %: 68.9 % (ref 43.0–77.0)
Platelets: 387 10*3/uL (ref 150.0–400.0)
RBC: 4.81 Mil/uL (ref 3.87–5.11)
RDW: 21.1 % — ABNORMAL HIGH (ref 11.5–15.5)
WBC: 11.8 10*3/uL — ABNORMAL HIGH (ref 4.0–10.5)

## 2021-09-18 NOTE — Assessment & Plan Note (Signed)
She donates blood frequently and has had low iron counts recently.  She has started taking an iron supplement once a day and has not had any side effects from this.  We will check CBC, iron panel today. ?

## 2021-09-18 NOTE — Assessment & Plan Note (Signed)
She has a history of OSA and is using a CPAP every night.  She states that recently she has been snoring a little bit even with the mask on.  Encouraged her to reach out to her pulmonologist to see if her CPAP settings need to be adjusted. ?

## 2021-09-18 NOTE — Patient Instructions (Addendum)
It was great to see you! ? ?We are checking your labs today and will let you know the results via mychart/phone.  ? ?Call pulmonology about CPAP settings. Let me know if you need a referral.  ? ?Let's follow-up in 6 months, sooner if you have concerns. ? ?If a referral was placed today, you will be contacted for an appointment. Please note that routine referrals can sometimes take up to 3-4 weeks to process. Please call our office if you haven't heard anything after this time frame. ? ?Take care, ? ?Vance Peper, NP ? ?

## 2021-09-18 NOTE — Assessment & Plan Note (Signed)
Chronic, stable.  She currently takes duloxetine 60 mg daily and her symptoms are well controlled.  Continue this medication.  No refills needed today.  Follow-up in 6 months ?

## 2021-09-18 NOTE — Assessment & Plan Note (Signed)
Chronic, stable.  She is taking omeprazole 40 mg daily.  She does not need any refills today.  Follow-up with worsening symptoms or concerns. ?

## 2021-09-18 NOTE — Assessment & Plan Note (Signed)
BMI is 62.1.  She states that she has a history of a gastric sleeve surgery and has lost about 60 pounds with this.  She states that she has recently put on some of this weight back, however she is working on maintaining a healthy diet and losing weight.  Follow-up with any concerns. ?

## 2021-09-18 NOTE — Assessment & Plan Note (Addendum)
Her last A1c on 04/2021 was 6.2%.  We will recheck CMP and  A1c today.  Continue with healthy eating and lifestyle changes. ?

## 2021-09-19 LAB — IRON,TIBC AND FERRITIN PANEL
%SAT: 6 % (calc) — ABNORMAL LOW (ref 16–45)
Ferritin: 13 ng/mL — ABNORMAL LOW (ref 16–154)
Iron: 25 ug/dL — ABNORMAL LOW (ref 40–190)
TIBC: 407 mcg/dL (calc) (ref 250–450)

## 2021-09-19 LAB — COMPREHENSIVE METABOLIC PANEL
ALT: 18 U/L (ref 0–35)
AST: 14 U/L (ref 0–37)
Albumin: 4.1 g/dL (ref 3.5–5.2)
Alkaline Phosphatase: 92 U/L (ref 39–117)
BUN: 21 mg/dL (ref 6–23)
CO2: 24 mEq/L (ref 19–32)
Calcium: 9.6 mg/dL (ref 8.4–10.5)
Chloride: 103 mEq/L (ref 96–112)
Creatinine, Ser: 0.75 mg/dL (ref 0.40–1.20)
GFR: 102.15 mL/min (ref >60.00)
Glucose, Bld: 83 mg/dL (ref 70–99)
Potassium: 4 mEq/L (ref 3.5–5.1)
Sodium: 140 mEq/L (ref 135–145)
Total Bilirubin: 0.2 mg/dL (ref 0.2–1.2)
Total Protein: 7.6 g/dL (ref 6.0–8.3)

## 2021-09-19 LAB — HEMOGLOBIN A1C: Hgb A1c MFr Bld: 6.1 % (ref 4.6–6.5)

## 2021-11-02 ENCOUNTER — Other Ambulatory Visit (HOSPITAL_COMMUNITY): Payer: Self-pay

## 2021-12-15 ENCOUNTER — Other Ambulatory Visit (HOSPITAL_COMMUNITY): Payer: Self-pay

## 2021-12-18 ENCOUNTER — Other Ambulatory Visit (HOSPITAL_COMMUNITY): Payer: Self-pay

## 2021-12-18 MED ORDER — DULOXETINE HCL 20 MG PO CPEP
ORAL_CAPSULE | ORAL | 0 refills | Status: DC
  Filled 2021-12-18: qty 90, 60d supply, fill #0

## 2021-12-19 ENCOUNTER — Other Ambulatory Visit (HOSPITAL_COMMUNITY): Payer: Self-pay

## 2021-12-20 ENCOUNTER — Other Ambulatory Visit (HOSPITAL_COMMUNITY): Payer: Self-pay

## 2021-12-29 ENCOUNTER — Encounter: Payer: 59 | Admitting: Nurse Practitioner

## 2022-01-02 ENCOUNTER — Ambulatory Visit (INDEPENDENT_AMBULATORY_CARE_PROVIDER_SITE_OTHER): Payer: 59

## 2022-01-02 DIAGNOSIS — Z23 Encounter for immunization: Secondary | ICD-10-CM

## 2022-01-02 NOTE — Progress Notes (Signed)
..  After obtaining consent, and per orders of Lauren McElwee, injection of Influenza Injection given by Augustina Mood. Patient instructed to remain in clinic for 20 minutes afterwards, and to report any adverse reaction to me immediately.

## 2022-01-30 ENCOUNTER — Other Ambulatory Visit (HOSPITAL_COMMUNITY): Payer: Self-pay

## 2022-02-12 ENCOUNTER — Other Ambulatory Visit (HOSPITAL_COMMUNITY): Payer: Self-pay

## 2022-02-12 MED ORDER — DULOXETINE HCL 20 MG PO CPEP
20.0000 mg | ORAL_CAPSULE | Freq: Every day | ORAL | 1 refills | Status: DC
  Filled 2022-02-12: qty 30, 30d supply, fill #0

## 2022-02-13 ENCOUNTER — Other Ambulatory Visit (HOSPITAL_COMMUNITY): Payer: Self-pay

## 2022-03-05 ENCOUNTER — Other Ambulatory Visit (HOSPITAL_COMMUNITY): Payer: Self-pay

## 2022-03-05 MED ORDER — CEPHALEXIN 500 MG PO CAPS
500.0000 mg | ORAL_CAPSULE | Freq: Two times a day (BID) | ORAL | 1 refills | Status: DC
  Filled 2022-03-05: qty 14, 7d supply, fill #0

## 2022-03-21 ENCOUNTER — Ambulatory Visit: Payer: 59 | Admitting: Nurse Practitioner

## 2022-03-26 ENCOUNTER — Ambulatory Visit: Payer: 59 | Admitting: Nurse Practitioner

## 2022-04-10 ENCOUNTER — Other Ambulatory Visit (HOSPITAL_COMMUNITY): Payer: Self-pay

## 2022-04-10 ENCOUNTER — Encounter: Payer: Self-pay | Admitting: Nurse Practitioner

## 2022-04-10 ENCOUNTER — Ambulatory Visit (INDEPENDENT_AMBULATORY_CARE_PROVIDER_SITE_OTHER): Payer: 59 | Admitting: Nurse Practitioner

## 2022-04-10 VITALS — BP 100/62 | HR 99 | Temp 98.7°F | Ht 68.0 in | Wt >= 6400 oz

## 2022-04-10 DIAGNOSIS — L03115 Cellulitis of right lower limb: Secondary | ICD-10-CM

## 2022-04-10 DIAGNOSIS — F411 Generalized anxiety disorder: Secondary | ICD-10-CM | POA: Diagnosis not present

## 2022-04-10 MED ORDER — CEPHALEXIN 500 MG PO CAPS
500.0000 mg | ORAL_CAPSULE | Freq: Three times a day (TID) | ORAL | 0 refills | Status: DC
  Filled 2022-04-10: qty 30, 10d supply, fill #0

## 2022-04-10 NOTE — Patient Instructions (Signed)
It was great to see you!  Start keflex 3 times a day for 10 days.   Let's follow-up if your symptoms worsen or don't improve.   Take care,  Vance Peper, NP

## 2022-04-10 NOTE — Assessment & Plan Note (Signed)
Chronic, stable.  She started following with psychiatry and has tapered off of her Cymbalta.  She states that her symptoms are doing well now and still has some follow-up appointments with psychiatry.  Continue collaboration and recommendations from specialist.

## 2022-04-10 NOTE — Progress Notes (Signed)
Established Patient Office Visit  Subjective   Patient ID: Mary Brandt, female    DOB: 10/18/1985  Age: 36 y.o. MRN: [id number]  Chief Complaint  Patient presents with   Follow-up    anxiety Not taking cymbalta Wants to see if can talk about leg, is not healing properly from 2 months ago    HPI  Mary Brandt is here to follow-up on anxiety and a recent fall.   She fell 2 months ago. Her leg hit a stand for a plant. She states her right shin was bruised for 2 weeks and broke some skin. She noticed redness and warmth as the bruise went away. She went to urgent care and was given keflex twice a day for 7 days. She states this helped some, but it is still red and swollen. She denies fevers and pain in the leg.   She saw a psychiatrist for her anxiety. She started tapering down on her cymbalta about 4-5 months ago. She has been off the medication for about 2 weeks. She is still following with psychiatry. Currently her symptoms are well controlled.      04/10/2022    8:58 AM 09/18/2021    1:23 PM 06/21/2021    3:10 PM 04/17/2021   12:36 PM 09/19/2020    9:52 AM  Depression screen PHQ 2/9  Decreased Interest 0 0 0 0 0  Down, Depressed, Hopeless 0 1 0 0 0  PHQ - 2 Score 0 1 0 0 0  Altered sleeping 0 1 0 0 1  Tired, decreased energy 0 0 0 0 1  Change in appetite 0 0 0 0 1  Feeling bad or failure about yourself  0 1 0 0 1  Trouble concentrating 0 0 0 0 0  Moving slowly or fidgety/restless 0 0 0 0 0  Suicidal thoughts 0 0 0 0 0  PHQ-9 Score 0 3 0 0 4  Difficult doing work/chores  Not difficult at all         04/10/2022    8:58 AM 09/18/2021    1:23 PM  GAD 7 : Generalized Anxiety Score  Nervous, Anxious, on Edge 1 1  Control/stop worrying 1 1  Worry too much - different things 1 1  Trouble relaxing 1 0  Restless 0 0  Easily annoyed or irritable 2 1  Afraid - awful might happen 1 0  Total GAD 7 Score 7 4  Anxiety Difficulty Not difficult at all Not difficult at all       ROS See pertinent positives and negatives per HPI.    Objective:     BP 100/62 (BP Location: Left Arm, Patient Position: Sitting, Cuff Size: Large)   Pulse 99   Temp 98.7 F (37.1 C) (Oral)   Ht '5\' 8"'$  (1.727 m)   Wt (!) 403 lb 9.6 oz (183.1 kg)   SpO2 98%   BMI 61.37 kg/m  BP Readings from Last 3 Encounters:  04/10/22 100/62  09/18/21 137/79  06/21/21 102/70   Wt Readings from Last 3 Encounters:  04/10/22 (!) 403 lb 9.6 oz (183.1 kg)  09/18/21 (!) 408 lb 6.4 oz (185.2 kg)  06/21/21 (!) 411 lb 8 oz (186.7 kg)      Physical Exam Vitals and nursing note reviewed.  Constitutional:      General: She is not in acute distress.    Appearance: Normal appearance. She is obese.  HENT:     Head: Normocephalic.  Eyes:  Conjunctiva/sclera: Conjunctivae normal.  Cardiovascular:     Rate and Rhythm: Normal rate and regular rhythm.     Pulses: Normal pulses.     Heart sounds: Normal heart sounds.  Pulmonary:     Effort: Pulmonary effort is normal.     Breath sounds: Normal breath sounds.  Musculoskeletal:     Cervical back: Normal range of motion.  Skin:    General: Skin is warm.     Findings: Erythema (right shin with warmth) present.  Neurological:     General: No focal deficit present.     Mental Status: She is alert and oriented to person, place, and time.  Psychiatric:        Mood and Affect: Mood normal.        Behavior: Behavior normal.        Thought Content: Thought content normal.        Judgment: Judgment normal.      Assessment & Plan:   Problem List Items Addressed This Visit       Other   Generalized anxiety disorder    Chronic, stable.  She started following with psychiatry and has tapered off of her Cymbalta.  She states that her symptoms are doing well now and still has some follow-up appointments with psychiatry.  Continue collaboration and recommendations from specialist.      Other Visit Diagnoses     Cellulitis of right lower  extremity    -  Primary   Noted to right lower shin.  Will do another course of Keflex 500 TID x 10 days.  Follow-up if symptoms worsen or do not improve.       Return in about 6 months (around 10/10/2022) for CPE.    Charyl Dancer, NP

## 2022-05-11 ENCOUNTER — Other Ambulatory Visit (HOSPITAL_COMMUNITY): Payer: Self-pay

## 2022-05-11 ENCOUNTER — Telehealth: Payer: Managed Care, Other (non HMO) | Admitting: Family Medicine

## 2022-05-11 DIAGNOSIS — B9689 Other specified bacterial agents as the cause of diseases classified elsewhere: Secondary | ICD-10-CM | POA: Diagnosis not present

## 2022-05-11 DIAGNOSIS — R051 Acute cough: Secondary | ICD-10-CM

## 2022-05-11 DIAGNOSIS — J019 Acute sinusitis, unspecified: Secondary | ICD-10-CM

## 2022-05-11 DIAGNOSIS — R062 Wheezing: Secondary | ICD-10-CM | POA: Diagnosis not present

## 2022-05-11 MED ORDER — AMOXICILLIN-POT CLAVULANATE 875-125 MG PO TABS
1.0000 | ORAL_TABLET | Freq: Two times a day (BID) | ORAL | 0 refills | Status: AC
  Filled 2022-05-11: qty 14, 7d supply, fill #0

## 2022-05-11 MED ORDER — ALBUTEROL SULFATE HFA 108 (90 BASE) MCG/ACT IN AERS
1.0000 | INHALATION_SPRAY | Freq: Four times a day (QID) | RESPIRATORY_TRACT | 0 refills | Status: DC | PRN
  Filled 2022-05-11: qty 6.7, 25d supply, fill #0

## 2022-05-11 MED ORDER — FLUTICASONE PROPIONATE 50 MCG/ACT NA SUSP
2.0000 | Freq: Every day | NASAL | 0 refills | Status: DC
  Filled 2022-05-11: qty 16, 30d supply, fill #0

## 2022-05-11 NOTE — Progress Notes (Signed)
E-Visit for Sinus Problems  We are sorry that you are not feeling well.  Here is how we plan to help!  Based on what you have shared with me it looks like you have sinusitis.  Sinusitis is inflammation and infection in the sinus cavities of the head.  Based on your presentation I believe you most likely have Acute Bacterial Sinusitis.  This is an infection caused by bacteria and is treated with antibiotics. I have prescribed Augmentin '875mg'$ /'125mg'$  one tablet twice daily with food, for 7 days. You may use an oral decongestant such as Mucinex D or if you have glaucoma or high blood pressure use plain Mucinex. Saline nasal spray help and can safely be used as often as needed for congestion.  If you develop worsening sinus pain, fever or notice severe headache and vision changes, or if symptoms are not better after completion of antibiotic, please schedule an appointment with a health care provider.    Sinus infections are not as easily transmitted as other respiratory infection, however we still recommend that you avoid close contact with loved ones, especially the very young and elderly.  Remember to wash your hands thoroughly throughout the day as this is the number one way to prevent the spread of infection!   I have also order a Albuterol inhaler to help with the coughing. Use Flonase to help with the sinus blockage and congestion. Tylenol for aches and pains. Salt water gargles for sore throat  Home Care: Only take medications as instructed by your medical team. Complete the entire course of an antibiotic. Do not take these medications with alcohol. A steam or ultrasonic humidifier can help congestion.  You can place a towel over your head and breathe in the steam from hot water coming from a faucet. Avoid close contacts especially the very young and the elderly. Cover your mouth when you cough or sneeze. Always remember to wash your hands.  Get Help Right Away If: You develop worsening  fever or sinus pain. You develop a severe head ache or visual changes. Your symptoms persist after you have completed your treatment plan.  Make sure you Understand these instructions. Will watch your condition. Will get help right away if you are not doing well or get worse.  Thank you for choosing an e-visit.  Your e-visit answers were reviewed by a board certified advanced clinical practitioner to complete your personal care plan. Depending upon the condition, your plan could have included both over the counter or prescription medications.  Please review your pharmacy choice. Make sure the pharmacy is open so you can pick up prescription now. If there is a problem, you may contact your provider through [employer] and have the prescription routed to another pharmacy.  Your safety is important to [place]. If you have drug allergies check your prescription carefully.   For the next 24 hours you can use MyChart to ask questions about today's visit, request a non-urgent call back, or ask for a work or school excuse. You will get an email in the next two days asking about your experience. I hope that your e-visit has been valuable and will speed your recovery.  I provided 5 minutes of non face-to-face time during this encounter for chart review, medication and order placement, as well as and documentation.

## 2022-05-11 NOTE — Addendum Note (Signed)
Addended by: Perlie Mayo on: 05/11/2022 08:31 AM   Modules accepted: Orders

## 2022-05-22 ENCOUNTER — Ambulatory Visit (INDEPENDENT_AMBULATORY_CARE_PROVIDER_SITE_OTHER): Payer: Managed Care, Other (non HMO) | Admitting: Nurse Practitioner

## 2022-05-22 ENCOUNTER — Encounter: Payer: Self-pay | Admitting: Nurse Practitioner

## 2022-05-22 ENCOUNTER — Other Ambulatory Visit (HOSPITAL_COMMUNITY): Payer: Self-pay

## 2022-05-22 VITALS — BP 112/70 | HR 85 | Temp 97.9°F | Ht 68.0 in | Wt >= 6400 oz

## 2022-05-22 DIAGNOSIS — J029 Acute pharyngitis, unspecified: Secondary | ICD-10-CM

## 2022-05-22 DIAGNOSIS — H6690 Otitis media, unspecified, unspecified ear: Secondary | ICD-10-CM | POA: Diagnosis not present

## 2022-05-22 LAB — POCT INFLUENZA A/B
Influenza A, POC: NEGATIVE
Influenza B, POC: NEGATIVE

## 2022-05-22 LAB — POC COVID19 BINAXNOW: SARS Coronavirus 2 Ag: NEGATIVE

## 2022-05-22 MED ORDER — DOXYCYCLINE HYCLATE 100 MG PO TABS
100.0000 mg | ORAL_TABLET | Freq: Two times a day (BID) | ORAL | 0 refills | Status: DC
  Filled 2022-05-22: qty 20, 10d supply, fill #0

## 2022-05-22 MED ORDER — PREDNISONE 20 MG PO TABS
40.0000 mg | ORAL_TABLET | Freq: Every day | ORAL | 0 refills | Status: DC
  Filled 2022-05-22: qty 10, 5d supply, fill #0

## 2022-05-22 NOTE — Progress Notes (Signed)
Acute Office Visit  Subjective:     Patient ID: Mary Brandt, female    DOB: 1985/09/25, 37 y.o.   MRN: [id number]  Chief Complaint  Patient presents with   Acute Visit    2 weeks ago  coughing  congestion , started with sore throat , some runny nose , no fever , taking otc medications given had e visit  finished antibiotic     HPI Patient is in today for sore throat, cough, and congestion for 2.5 weeks. She did an e-visit on 05/11/22 and was prescribed augmentin for 7 days. She is concerned that she is getting re-sick because her throat started hurting again and feeling fatigued again.   UPPER RESPIRATORY TRACT INFECTION  Fever: no Cough: yes Shortness of breath: no Wheezing: no Chest pain: no Chest tightness: no Chest congestion: yes Nasal congestion: yes Runny nose: no Post nasal drip: yes Sneezing: no Sore throat: yes Swollen glands: no Sinus pressure: no Headache: yes Face pain: no Toothache: no Ear pain: no bilateral Ear pressure: yes bilateral Eyes red/itching:no Eye drainage/crusting: yes  Vomiting: no Rash: no Fatigue: yes Sick contacts: no Strep contacts: no  Context: worse Relief with OTC cold/cough medications:  a little   Treatments attempted: mucinex, ibuprofen, flonase, albuterol, robitussin at night, cetirizine   ROS See pertinent positives and negatives per HPI.    Objective:    BP 112/70   Pulse 85   Temp 97.9 F (36.6 C)   Ht '5\' 8"'$  (1.727 m)   Wt (!) 401 lb 12.8 oz (182.3 kg)   SpO2 96%   BMI 61.09 kg/m    Physical Exam Vitals and nursing note reviewed.  Constitutional:      General: She is not in acute distress.    Appearance: Normal appearance.  HENT:     Head: Normocephalic.     Right Ear: External ear normal. A middle ear effusion is present. Tympanic membrane is erythematous.     Left Ear: External ear normal. A middle ear effusion is present. Tympanic membrane is erythematous.     Mouth/Throat:     Pharynx:  Posterior oropharyngeal erythema present. No oropharyngeal exudate.  Eyes:     Conjunctiva/sclera: Conjunctivae normal.  Cardiovascular:     Rate and Rhythm: Normal rate and regular rhythm.     Pulses: Normal pulses.     Heart sounds: Normal heart sounds.  Pulmonary:     Effort: Pulmonary effort is normal.     Breath sounds: Normal breath sounds.  Musculoskeletal:     Cervical back: Normal range of motion and neck supple. No tenderness.  Lymphadenopathy:     Cervical: No cervical adenopathy.  Skin:    General: Skin is warm.  Neurological:     General: No focal deficit present.     Mental Status: She is alert and oriented to person, place, and time.  Psychiatric:        Mood and Affect: Mood normal.        Behavior: Behavior normal.        Thought Content: Thought content normal.        Judgment: Judgment normal.     Results for orders placed or performed in visit on 05/22/22  POCT Influenza A/B  Result Value Ref Range   Influenza A, POC Negative Negative   Influenza B, POC Negative Negative  POC COVID-19  Result Value Ref Range   SARS Coronavirus 2 Ag Negative Negative  Assessment & Plan:   Problem List Items Addressed This Visit   None Visit Diagnoses     Acute otitis media, unspecified otitis media type    -  Primary   Will treat with prednisone and doxycycline. Can take ibuprofen prn pain. Start flonase nasal spray daily. F/U if not improving.   Relevant Medications   doxycycline (VIBRA-TABS) 100 MG tablet   Sore throat       POC flu and covid 19 negative. Continue zyrtec and start flonase. Sore throat most likely from post nasal drip.   Relevant Orders   POCT Influenza A/B (Completed)   POC COVID-19 (Completed)       Meds ordered this encounter  Medications   doxycycline (VIBRA-TABS) 100 MG tablet    Sig: Take 1 tablet (100 mg total) by mouth 2 (two) times daily.    Dispense:  20 tablet    Refill:  0   predniSONE (DELTASONE) 20 MG tablet     Sig: Take 2 tablets (40 mg total) by mouth daily with breakfast.    Dispense:  10 tablet    Refill:  0    Return if symptoms worsen or fail to improve.  Charyl Dancer, NP

## 2022-05-22 NOTE — Patient Instructions (Signed)
It was great to see you!  Start doxycycline 1 tablet twice a day for 10 days with food  Start prednisone 2 tablets in the morning with food for 5 days.   Keep taking the flonase and cetirizine. You gargle with warm salt water. Keep drinking plenty of fluids.   Let's follow-up if your symptoms worsen or don't improve.   Take care,  Vance Peper, NP

## 2022-05-25 ENCOUNTER — Ambulatory Visit: Payer: 59 | Admitting: Nurse Practitioner

## 2022-06-20 ENCOUNTER — Other Ambulatory Visit (HOSPITAL_COMMUNITY): Payer: Self-pay

## 2022-06-20 MED ORDER — FLUOXETINE HCL 20 MG PO CAPS
20.0000 mg | ORAL_CAPSULE | Freq: Every day | ORAL | 1 refills | Status: DC
  Filled 2022-06-20: qty 30, 30d supply, fill #0

## 2022-06-23 ENCOUNTER — Other Ambulatory Visit (HOSPITAL_COMMUNITY): Payer: Self-pay

## 2022-07-18 ENCOUNTER — Other Ambulatory Visit (HOSPITAL_COMMUNITY): Payer: Self-pay

## 2022-07-18 MED ORDER — FLUOXETINE HCL 40 MG PO CAPS
40.0000 mg | ORAL_CAPSULE | Freq: Every day | ORAL | 1 refills | Status: DC
  Filled 2022-07-18: qty 30, 30d supply, fill #0

## 2022-07-26 ENCOUNTER — Other Ambulatory Visit: Payer: Self-pay

## 2022-07-31 ENCOUNTER — Other Ambulatory Visit (HOSPITAL_COMMUNITY): Payer: Self-pay

## 2022-08-08 ENCOUNTER — Encounter: Payer: Self-pay | Admitting: Nurse Practitioner

## 2022-08-08 ENCOUNTER — Other Ambulatory Visit (HOSPITAL_COMMUNITY): Payer: Self-pay

## 2022-08-08 DIAGNOSIS — K219 Gastro-esophageal reflux disease without esophagitis: Secondary | ICD-10-CM

## 2022-08-08 MED ORDER — OMEPRAZOLE 40 MG PO CPDR
40.0000 mg | DELAYED_RELEASE_CAPSULE | Freq: Every day | ORAL | 3 refills | Status: DC
  Filled 2022-08-08: qty 90, 90d supply, fill #0

## 2022-08-09 ENCOUNTER — Encounter (HOSPITAL_COMMUNITY): Payer: Self-pay | Admitting: *Deleted

## 2022-08-20 ENCOUNTER — Other Ambulatory Visit (HOSPITAL_COMMUNITY): Payer: Self-pay

## 2022-08-20 MED ORDER — FLUOXETINE HCL 40 MG PO CAPS
40.0000 mg | ORAL_CAPSULE | Freq: Every day | ORAL | 1 refills | Status: DC
  Filled 2022-08-20: qty 90, 90d supply, fill #0
  Filled 2022-11-14: qty 90, 90d supply, fill #1

## 2022-09-27 ENCOUNTER — Ambulatory Visit (INDEPENDENT_AMBULATORY_CARE_PROVIDER_SITE_OTHER): Payer: Managed Care, Other (non HMO) | Admitting: Nurse Practitioner

## 2022-09-27 ENCOUNTER — Other Ambulatory Visit (HOSPITAL_COMMUNITY): Payer: Self-pay

## 2022-09-27 ENCOUNTER — Encounter: Payer: Self-pay | Admitting: Nurse Practitioner

## 2022-09-27 VITALS — BP 118/86 | HR 75 | Temp 97.3°F | Ht 68.0 in | Wt 388.0 lb

## 2022-09-27 DIAGNOSIS — Z9884 Bariatric surgery status: Secondary | ICD-10-CM

## 2022-09-27 DIAGNOSIS — F411 Generalized anxiety disorder: Secondary | ICD-10-CM

## 2022-09-27 DIAGNOSIS — D509 Iron deficiency anemia, unspecified: Secondary | ICD-10-CM

## 2022-09-27 DIAGNOSIS — R7303 Prediabetes: Secondary | ICD-10-CM | POA: Diagnosis not present

## 2022-09-27 DIAGNOSIS — K219 Gastro-esophageal reflux disease without esophagitis: Secondary | ICD-10-CM

## 2022-09-27 DIAGNOSIS — Z0000 Encounter for general adult medical examination without abnormal findings: Secondary | ICD-10-CM | POA: Diagnosis not present

## 2022-09-27 DIAGNOSIS — G4733 Obstructive sleep apnea (adult) (pediatric): Secondary | ICD-10-CM

## 2022-09-27 LAB — COMPREHENSIVE METABOLIC PANEL
ALT: 20 U/L (ref 0–35)
AST: 15 U/L (ref 0–37)
Albumin: 3.9 g/dL (ref 3.5–5.2)
Alkaline Phosphatase: 97 U/L (ref 39–117)
BUN: 21 mg/dL (ref 6–23)
CO2: 27 mEq/L (ref 19–32)
Calcium: 9.1 mg/dL (ref 8.4–10.5)
Chloride: 103 mEq/L (ref 96–112)
Creatinine, Ser: 0.75 mg/dL (ref 0.40–1.20)
GFR: 101.42 mL/min (ref >60.00)
Glucose, Bld: 95 mg/dL (ref 70–99)
Potassium: 4 mEq/L (ref 3.5–5.1)
Sodium: 139 mEq/L (ref 135–145)
Total Bilirubin: 0.4 mg/dL (ref 0.2–1.2)
Total Protein: 6.7 g/dL (ref 6.0–8.3)

## 2022-09-27 LAB — CBC
HCT: 42.5 % (ref 36.0–46.0)
Hemoglobin: 13.5 g/dL (ref 12.0–15.0)
MCHC: 31.9 g/dL (ref 30.0–36.0)
MCV: 87.8 fl (ref 78.0–100.0)
Platelets: 364 10*3/uL (ref 150.0–400.0)
RBC: 4.84 Mil/uL (ref 3.87–5.11)
RDW: 15.7 % — ABNORMAL HIGH (ref 11.5–15.5)
WBC: 9.5 10*3/uL (ref 4.0–10.5)

## 2022-09-27 LAB — VITAMIN B12: Vitamin B-12: 608 pg/mL (ref 211–911)

## 2022-09-27 LAB — HEMOGLOBIN A1C: Hgb A1c MFr Bld: 6.1 % (ref 4.6–6.5)

## 2022-09-27 LAB — VITAMIN D 25 HYDROXY (VIT D DEFICIENCY, FRACTURES): VITD: 22.01 ng/mL — ABNORMAL LOW (ref 30.00–100.00)

## 2022-09-27 MED ORDER — ESOMEPRAZOLE MAGNESIUM 40 MG PO CPDR
40.0000 mg | DELAYED_RELEASE_CAPSULE | Freq: Every day | ORAL | 3 refills | Status: DC
  Filled 2022-09-27 – 2022-10-06 (×2): qty 90, 90d supply, fill #0
  Filled 2022-12-30: qty 90, 90d supply, fill #1
  Filled 2023-04-03: qty 90, 90d supply, fill #2
  Filled 2023-07-08: qty 90, 90d supply, fill #3

## 2022-09-27 NOTE — Assessment & Plan Note (Signed)
Chronic (stable) GAD. GAD 7 score today was 4. Continue Fluoxetine as prescribed.

## 2022-09-27 NOTE — Progress Notes (Signed)
BP 118/86 (BP Location: Right Arm)   Pulse 75   Temp (!) 97.3 F (36.3 C)   Ht 5\' 8"  (1.727 m)   Wt (!) 388 lb (176 kg)   SpO2 94%   BMI 59.00 kg/m    Subjective:    Patient ID: Mary Brandt, female    DOB: [birth year], 37 y.o.   MRN: [id number]  CC: Chief Complaint  Patient presents with   Annual Exam    Patient is not fasting, concerns with acid-reflux getting worse, medication management for pain    HPI: Mary Brandt is a 37 y.o. female presenting on 09/27/2022 for comprehensive medical examination. Current medical complaints include: Concerns related to worsening acid-reflux and medication management. Mary Brandt states she has been taking the omeprazole for several years and over that last 2 months it does not seem to be helping. Mary Brandt expressed over the last week everything she eat makes her nauseated and shared she has had a couple of episodes with gastric regurgitation. She is experiencing increased bubbling, acidity and burning sensation in abdomen more than ever. In addition to omeprazole she has taken Tums and Pepto-bismol will little relief. Mary Brandt states this constant discomfort is rating 5/10. She is still able to do her daily tasks. Denies any fever, chills, cardiopulmonary concerns at this time.   She currently lives with: husband Menopausal Symptoms: no  Depression and Anxiety Screen done today and results listed below:     09/27/2022   10:04 AM 04/10/2022    8:58 AM 09/18/2021    1:23 PM 06/21/2021    3:10 PM 04/17/2021   12:36 PM  Depression screen PHQ 2/9  Decreased Interest 0 0 0 0 0  Down, Depressed, Hopeless 0 0 1 0 0  PHQ - 2 Score 0 0 1 0 0  Altered sleeping 0 0 1 0 0  Tired, decreased energy 0 0 0 0 0  Change in appetite 0 0 0 0 0  Feeling bad or failure about yourself  0 0 1 0 0  Trouble concentrating 0 0 0 0 0  Moving slowly or fidgety/restless 0 0 0 0 0  Suicidal thoughts 0 0 0 0 0  PHQ-9 Score 0 0 3 0 0  Difficult doing work/chores   Not  difficult at all        09/27/2022   10:05 AM 04/10/2022    8:58 AM 09/18/2021    1:23 PM  GAD 7 : Generalized Anxiety Score  Nervous, Anxious, on Edge 1 1 1   Control/stop worrying 0 1 1  Worry too much - different things 1 1 1   Trouble relaxing 1 1 0  Restless 0 0 0  Easily annoyed or irritable 0 2 1  Afraid - awful might happen 1 1 0  Total GAD 7 Score 4 7 4   Anxiety Difficulty Not difficult at all Not difficult at all Not difficult at all    The patient has a history of falls. I did complete a risk assessment for falls. A plan of care for falls was documented.   Past Medical History:  Past Medical History:  Diagnosis Date   Anxiety    Asthma    Hyperglycemia    Hyperlipemia    Morbid obesity (HCC)    sees weight loss clinic   [facility]    sees dermatologist, Dr. Danella Deis   Obstructive sleep apnea on CPAP     Surgical History:  Past Surgical History:  Procedure Laterality Date  finger laceration Right 2008   2nd finger   implanon insertion  06-26-11   left upper arm   LAPAROSCOPIC GASTRIC SLEEVE RESECTION N/A 01/05/2019   Procedure: LAPAROSCOPIC GASTRIC SLEEVE RESECTION, UPPER ENDO, ERAS Pathway;  Surgeon: Berna Bue, MD;  Location: [facility];  Service: General;  Laterality: N/A;   MOLE REMOVAL     OTHER SURGICAL HISTORY Left [id number]    Implanon   WISDOM TOOTH EXTRACTION      Medications:  Current Outpatient Medications on File Prior to Visit  Medication Sig   ibuprofen (ADVIL) 400 MG tablet Take 400 mg by mouth as needed.   IRON-FOLIC ACID PO Take 60 mg by mouth.   Lavender Oil 80 MG CAPS    levonorgestrel (MIRENA) 20 MCG/24HR IUD 1 each by Intrauterine route once.   Multiple Vitamins-Minerals (MULTIVITAMIN ADULT PO) Take 1 tablet by mouth daily.    FLUoxetine (PROZAC) 40 MG capsule Take 1 capsule (40 mg total) by mouth daily.   No current facility-administered medications on file prior to visit.    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  Other Topics Concern   Not on file  Social History Narrative      Marital Status: Married Government social research officer)    Children:  None    Pets:  2 dogs   Living Situation: Lives with his husband.   Occupation: Teacher, music     Education: Engineer, maintenance (IT) ([employer]) Social worker [employer])    Alcohol Use:  Occasional   Tobacco Use:  None    Drug Use:  None   Diet:  Regular   Exercise:  None   Hobbies:  Reading, Movies             Social Determinants of Health   Financial Resource Strain: Low Risk  (06/15/2021)   Overall Financial Resource Strain (CARDIA)    Difficulty of Paying Living Expenses: Not hard at all  Food Insecurity: No Food Insecurity (06/15/2021)   Hunger Vital Sign    Worried About Running Out of Food in the Last Year: Never true    Ran Out of Food in the Last Year: Never true  Transportation Needs: No Transportation Needs (06/15/2021)   PRAPARE - Administrator, Civil Service (Medical): No    Lack of Transportation (Non-Medical): No  Physical Activity: Insufficiently Active (06/15/2021)   Exercise Vital Sign    Days of Exercise per Week: 3 days    Minutes of Exercise per Session: 30 min  Stress: No Stress Concern Present (06/15/2021)   [employer] of Occupational Health - Occupational Stress Questionnaire    Feeling of Stress : Only a little  Social Connections:  Socially Integrated (06/15/2021)   Social Connection and Isolation Panel [NHANES]    Frequency of  Communication with Friends and Family: More than three times a week    Frequency of Social Gatherings with Friends and Family: Twice a week    Attends Religious Services: More than 4 times per year    Active Member of [employer] or Organizations: Yes    Attends Engineer, structural: More than 4 times per year    Marital Status: Married  Catering manager Violence: Not on file   Social History   Tobacco Use  Smoking Status Never  Smokeless Tobacco Never   Social History   Substance and Sexual Activity  Alcohol Use Not Currently    Family History:  Family History  Problem Relation Age of Onset   Atrial fibrillation Mother    Diabetes Father    Cancer Father        skin   CVA Father    Polycystic ovary syndrome Sister    Depression Sister    Anxiety disorder Sister    Bipolar disorder Sister    Dementia Maternal Grandmother    Cancer Maternal Grandfather        skin    Past medical history, surgical history, medications, allergies, family history and social history reviewed with patient today and changes made to appropriate areas of the chart.   Review of Systems  Constitutional:  Positive for weight loss. Negative for chills and fever.       Dieting at home with her husband.  HENT: Negative.  Negative for ear discharge, ear pain and sore throat.   Eyes: Negative.  Negative for pain, discharge and redness.  Respiratory: Negative.  Negative for cough, shortness of breath and wheezing.   Cardiovascular: Negative.  Negative for chest pain and leg swelling.  Gastrointestinal:  Positive for nausea.  Genitourinary: Negative.   Musculoskeletal:  Positive for back pain and myalgias.  Skin: Negative.  Negative for itching and rash.  Neurological: Negative.  Negative for headaches.  Endo/Heme/Allergies: Negative.   Psychiatric/Behavioral: Negative.  Negative for depression and  substance abuse. The patient is not nervous/anxious.        Patient states Fluoxetine is helping, she feel good.    All other ROS negative except what is listed above and in the HPI.      Objective:    BP 118/86 (BP Location: Right Arm)   Pulse 75   Temp (!) 97.3 F (36.3 C)   Ht 5\' 8"  (1.727 m)   Wt (!) 388 lb (176 kg)   SpO2 94%   BMI 59.00 kg/m   Wt Readings from Last 3 Encounters:  09/27/22 (!) 388 lb (176 kg)  05/22/22 (!) 401 lb 12.8 oz (182.3 kg)  04/10/22 (!) 403 lb 9.6 oz (183.1 kg)    Physical Exam Constitutional:      Appearance: Normal appearance. She is obese. She is not ill-appearing.  HENT:     Head: Normocephalic and atraumatic.     Right Ear: Tympanic membrane, ear canal and external ear normal.     Left Ear: Tympanic membrane, ear canal and external ear normal.     Nose: Nose normal. No congestion or rhinorrhea.     Mouth/Throat:     Mouth: Mucous membranes are moist.     Pharynx: No posterior oropharyngeal erythema.  Eyes:     Extraocular Movements: Extraocular movements intact.     Conjunctiva/sclera: Conjunctivae normal.     Pupils: Pupils are equal, round, and reactive to light.  Cardiovascular:     Rate and Rhythm: Normal rate  and regular rhythm.     Pulses: Normal pulses.     Heart sounds: Normal heart sounds. No murmur heard. Pulmonary:     Effort: Pulmonary effort is normal. No respiratory distress.     Breath sounds: Normal breath sounds. No wheezing.  Abdominal:     General: Abdomen is protuberant. Bowel sounds are normal.     Palpations: Abdomen is soft. There is no mass.  Musculoskeletal:        General: No swelling. Normal range of motion.     Cervical back: Normal range of motion and neck supple.     Right lower leg: No edema.     Left lower leg: No edema.     Comments: Generalize back pain (Chronic)  Skin:    General: Skin is warm and dry.     Capillary Refill: Capillary refill takes 2 to 3 seconds.     Findings: No erythema or  rash.  Neurological:     Mental Status: She is alert and oriented to person, place, and time.     Deep Tendon Reflexes: Reflexes normal.  Psychiatric:        Mood and Affect: Mood normal.        Behavior: Behavior normal.        Thought Content: Thought content normal.        Judgment: Judgment normal.     Results for orders placed or performed in visit on 05/22/22  POCT Influenza A/B  Result Value Ref Range   Influenza A, POC Negative Negative   Influenza B, POC Negative Negative  POC COVID-19  Result Value Ref Range   SARS Coronavirus 2 Ag Negative Negative      Assessment & Plan:   Problem List Items Addressed This Visit       Respiratory   OSA (obstructive sleep apnea)    Chronic (stable). Patient uses CPAP every night for maximum of 9 hours. Tolerating well.         Digestive   GERD (gastroesophageal reflux disease)    Patient has worsening gastroesophageal reflux (chronic, unstable). Prescribed Omeprazole has not been as effective. Has been taking for several years. Taking TUMS and Pepto-bismol will little relief. Discontinued omeprazole and ordered Esomeprazole (Nexium) 40 mg daily. Follow-up if gastroesophageal symptoms do not improve.       Relevant Medications   esomeprazole (NEXIUM) 40 MG capsule     Other   Generalized anxiety disorder    Chronic (stable) GAD. GAD 7 score today was 4. Continue Fluoxetine as prescribed.       Prediabetes    Patient previous Hgb A1c was 6.1. Will recheck Hbg A1c today.      Relevant Orders   Hemoglobin A1c   Iron deficiency anemia    Chronic, ongoing. Taking Iron supplements daily. No concerns at this time. Ordered Iron, TIBC, Ferritin labs today.      Relevant Orders   CBC   Iron, TIBC and Ferritin Panel   Routine general medical examination at a health care facility - Primary    Health maintenance reviewed and physical exam completed. Family History and social history reviewed. Discussed nutrition, exercise.  Check CMP, CBC  today. Follow-up 1 year.       Relevant Orders   CBC   Comprehensive metabolic panel   History of bariatric surgery    History of gastric sleeve resection in August 2020. Patient has lost 13 pounds since 05/22/2022. Currently dieting with husband. Dieting has been effective. Ordered  Vitamin B 12 and Vitamin D for monitoring of levels secondary to the bariatric surgery.       Relevant Orders   CBC   Comprehensive metabolic panel   Iron, TIBC and Ferritin Panel   Vitamin B12   VITAMIN D 25 Hydroxy (Vit-D Deficiency, Fractures)     Follow up plan: Return in about 1 year (around 09/27/2023) for CPE.   LABORATORY TESTING:  - Pap smear: up to date  IMMUNIZATIONS:   - Tdap: Tetanus vaccination status reviewed: last tetanus booster within 10 years. - Influenza: Up to date - Pneumovax: Not applicable - Prevnar: Not applicable - HPV: Up to date - Zostavax vaccine: Not applicable  SCREENING: -Mammogram: Not applicable  - Colonoscopy: Not applicable  - Bone Density: Not applicable   PATIENT COUNSELING:   Advised to take 1 mg of folate supplement per day if capable of pregnancy.   Sexuality: Discussed sexually transmitted diseases, partner selection, use of condoms, avoidance of unintended pregnancy  and contraceptive alternatives.   Advised to avoid cigarette smoking.  I discussed with the patient that most people either abstain from alcohol or drink within safe limits (<=14/week and <=4 drinks/occasion for males, <=7/weeks and <= 3 drinks/occasion for females) and that the risk for alcohol disorders and other health effects rises proportionally with the number of drinks per week and how often a drinker exceeds daily limits.  Discussed cessation/primary prevention of drug use and availability of treatment for abuse.   Diet: Encouraged to adjust caloric intake to maintain  or achieve ideal body weight, to reduce intake of dietary saturated fat and total fat, to limit  sodium intake by avoiding high sodium foods and not adding table salt, and to maintain adequate dietary potassium and calcium preferably from fresh fruits, vegetables, and low-fat dairy products.    stressed the importance of regular exercise  Injury prevention: Discussed safety belts, safety helmets, smoke detector, smoking near bedding or upholstery.   Dental health: Discussed importance of regular tooth brushing, flossing, and dental visits.    NEXT PREVENTATIVE PHYSICAL DUE IN 1 YEAR. Return in about 1 year (around 09/27/2023) for CPE.

## 2022-09-27 NOTE — Assessment & Plan Note (Addendum)
Chronic, ongoing. Taking Iron supplements daily. No concerns at this time. Ordered Iron, TIBC, Ferritin labs today.

## 2022-09-27 NOTE — Assessment & Plan Note (Addendum)
Patient previous Hgb A1c was 6.1. Will recheck Hbg A1c today.

## 2022-09-27 NOTE — Assessment & Plan Note (Addendum)
Health maintenance reviewed and physical exam completed. Family History and social history reviewed. Discussed nutrition, exercise. Check CMP, CBC  today. Follow-up 1 year.

## 2022-09-27 NOTE — Assessment & Plan Note (Addendum)
Chronic (stable). Patient uses CPAP every night for maximum of 9 hours. Tolerating well.

## 2022-09-27 NOTE — Assessment & Plan Note (Addendum)
History of gastric sleeve resection in August 2020. Patient has lost 13 pounds since 05/22/2022. Currently dieting with husband. Dieting has been effective. Ordered Vitamin B 12 and Vitamin D for monitoring of levels secondary to the bariatric surgery.

## 2022-09-27 NOTE — Assessment & Plan Note (Addendum)
Patient has worsening gastroesophageal reflux (chronic, unstable). Prescribed Omeprazole has not been as effective. Has been taking for several years. Taking TUMS and Pepto-bismol will little relief. Discontinued omeprazole and ordered Esomeprazole (Nexium) 40 mg daily. Follow-up if gastroesophageal symptoms do not improve.

## 2022-09-27 NOTE — Patient Instructions (Signed)
It was great to see you!  We are checking your labs today and will let you know the results via mychart/phone.   Start nexium 1 capsule daily instead of the omeprazole.   Let's follow-up in 1 year, sooner if you have concerns.  If a referral was placed today, you will be contacted for an appointment. Please note that routine referrals can sometimes take up to 3-4 weeks to process. Please call our office if you haven't heard anything after this time frame.  Take care,  Rodman Pickle, NP

## 2022-09-28 LAB — IRON,TIBC AND FERRITIN PANEL
%SAT: 16 % (calc) (ref 16–45)
Ferritin: 28 ng/mL (ref 16–154)
Iron: 61 ug/dL (ref 40–190)
TIBC: 371 mcg/dL (calc) (ref 250–450)

## 2022-10-05 ENCOUNTER — Other Ambulatory Visit (HOSPITAL_COMMUNITY): Payer: Self-pay

## 2022-10-06 ENCOUNTER — Other Ambulatory Visit (HOSPITAL_COMMUNITY): Payer: Self-pay

## 2022-10-10 ENCOUNTER — Encounter: Payer: 59 | Admitting: Nurse Practitioner

## 2022-11-06 IMAGING — US US ABDOMEN LIMITED
1 series · 14 of 25 positions shown · non-contrast
Comparison: None.

CLINICAL DATA: 34-year-old female with elevated liver enzymes.

EXAM:
ULTRASOUND ABDOMEN LIMITED RIGHT UPPER QUADRANT

[Series 1: us abdomen limited · 0.22mm/px · 14 of 44 slices shown]
[im 1/44]
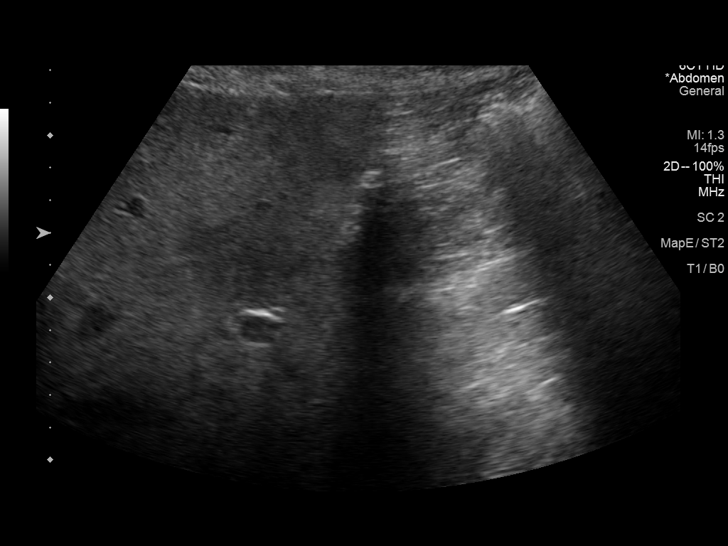
[im 4/44]
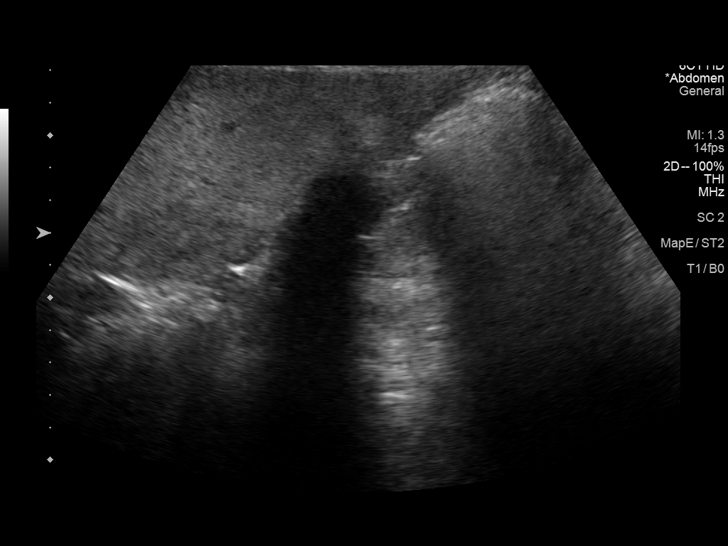
[im 8/44]
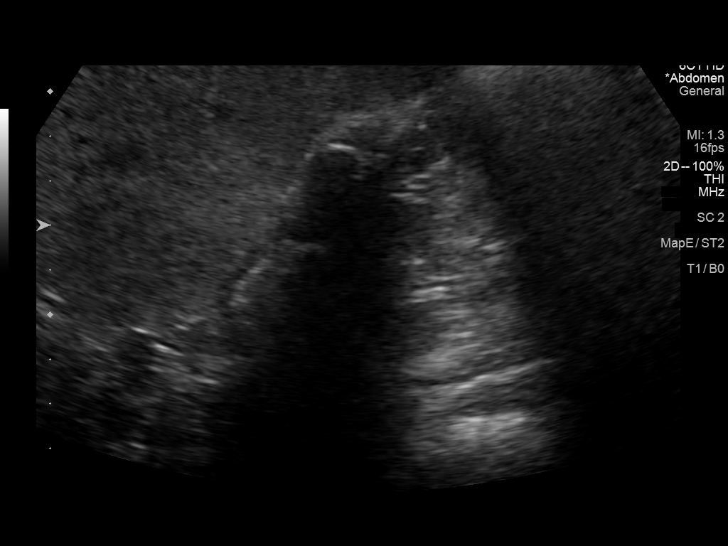
[im 11/44]
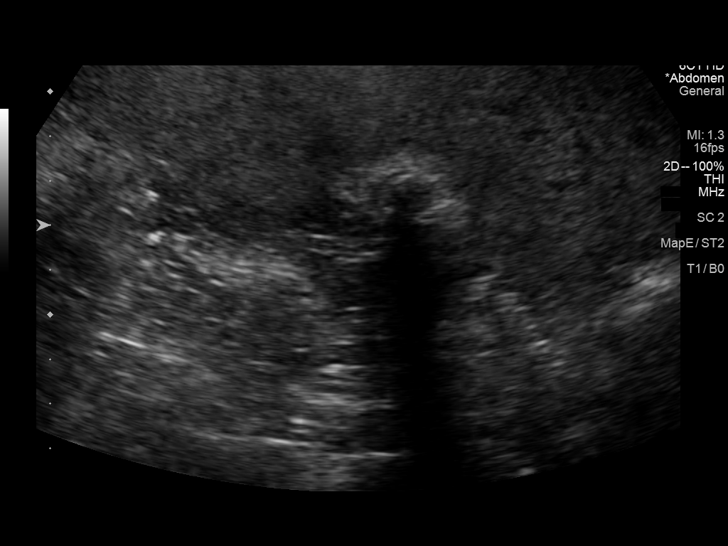
[im 15/44]
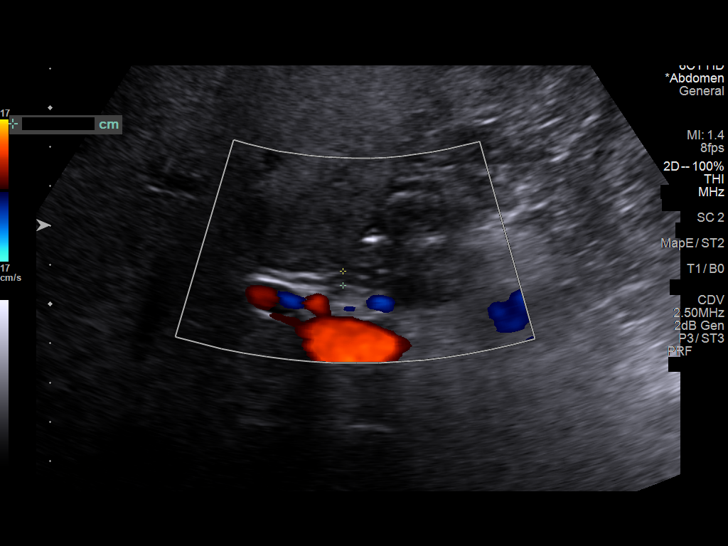
[im 17/44]
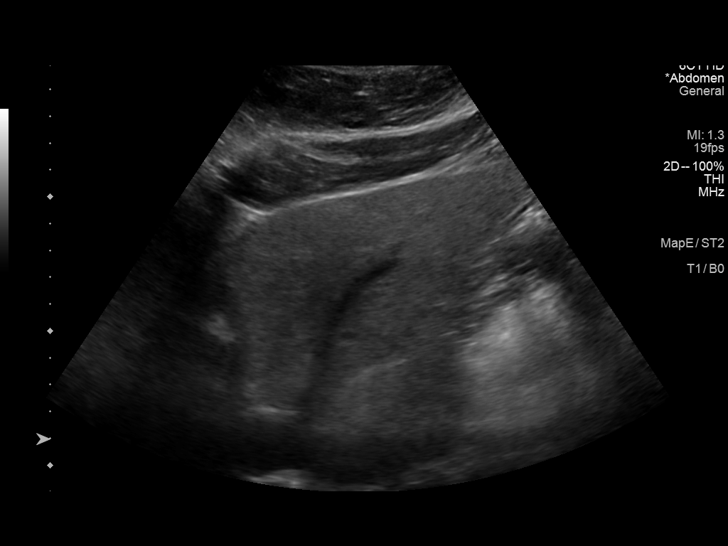
[im 20/44]
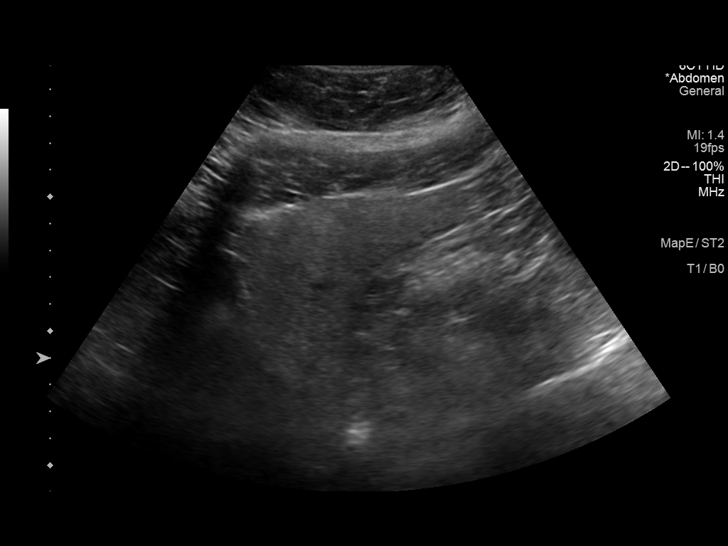
[im 24/44]
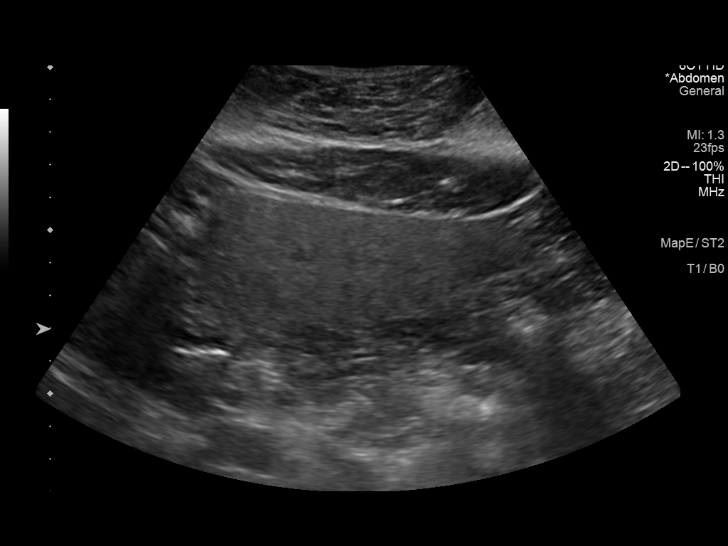
[im 27/44]
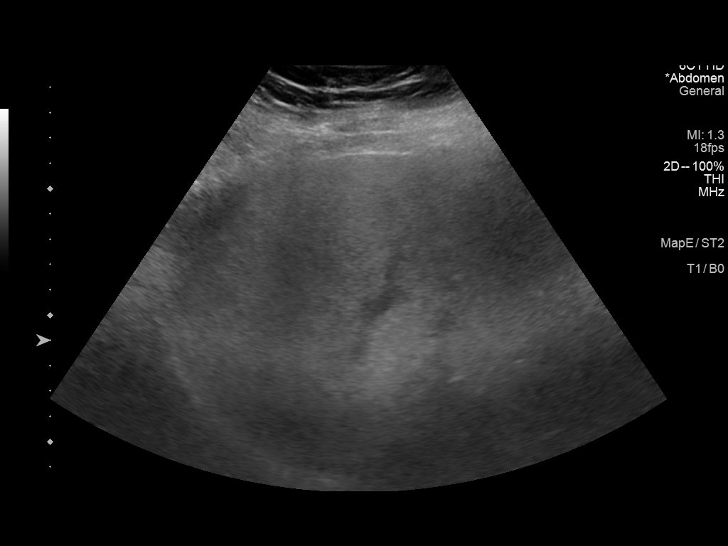
[im 29/44]
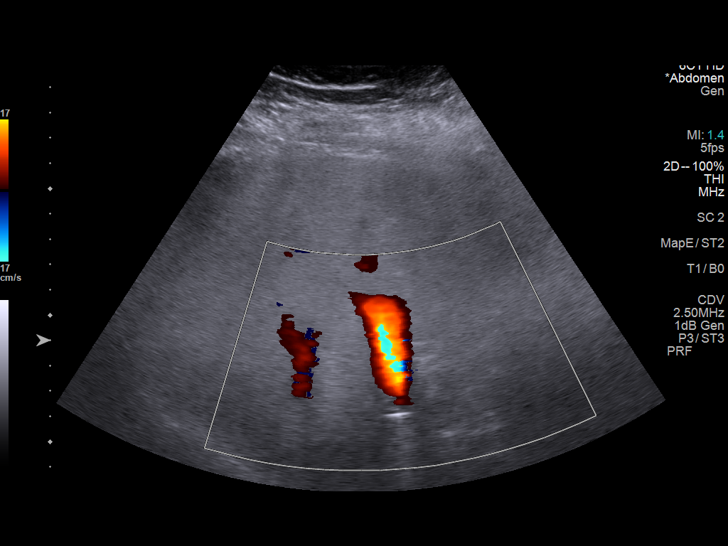
[im 33/44]
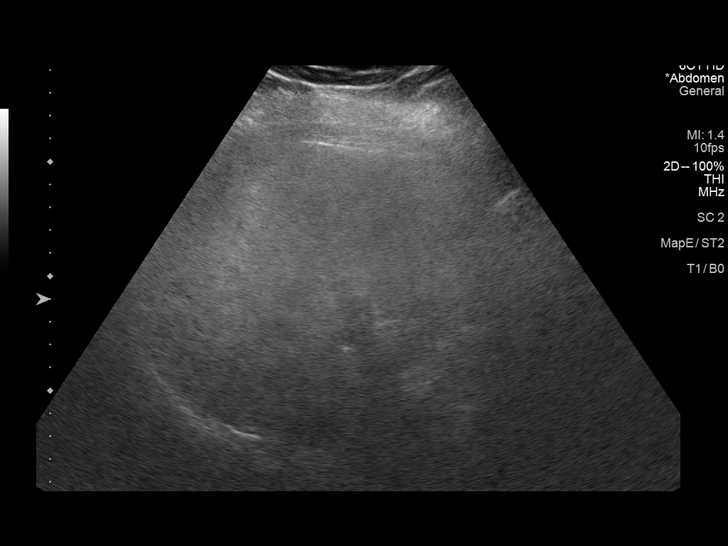
[im 36/44]
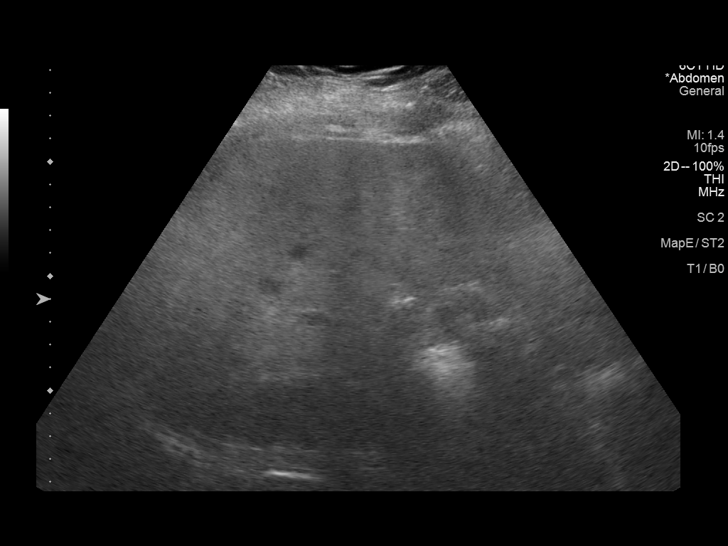
[im 40/44]
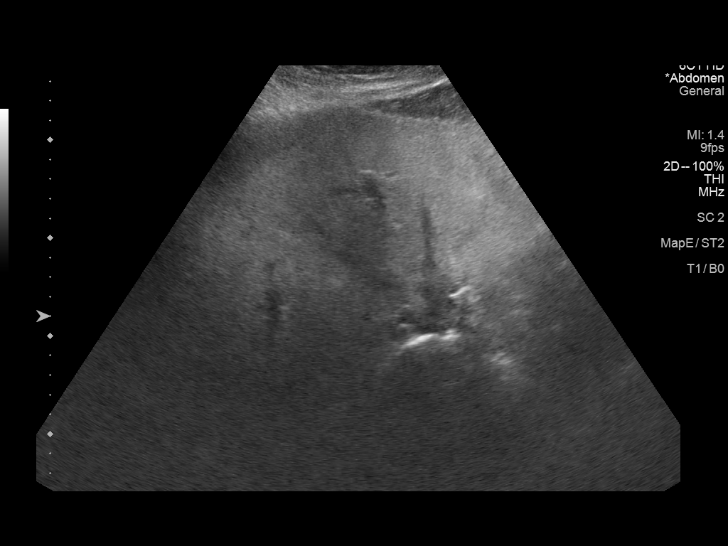
[im 44/44]
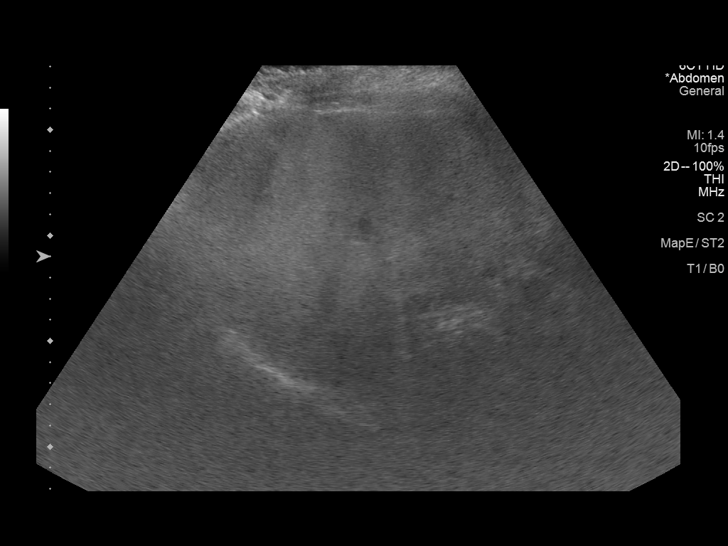

[14 of 25 positions shown; findings below may reference images not displayed]

FINDINGS: Gallbladder:

The gallbladder is filled with stones. No gallbladder wall
thickening or pericholecystic fluid. Negative sonographic Murphy's
sign.

Common bile duct:

Diameter: 4 mm

Liver:

There is diffuse increased liver echogenicity most commonly seen in
the setting of fatty infiltration. Superimposed inflammation or
fibrosis is not excluded. Clinical correlation is recommended.
Portal vein is patent on color Doppler imaging with normal direction
of blood flow towards the liver.

Other: None.
IMPRESSION: 1. Cholelithiasis without sonographic evidence of acute
cholecystitis.
2. Fatty liver.

## 2022-11-19 ENCOUNTER — Other Ambulatory Visit (HOSPITAL_COMMUNITY): Payer: Self-pay

## 2022-11-19 MED ORDER — FLUOXETINE HCL 40 MG PO CAPS
40.0000 mg | ORAL_CAPSULE | Freq: Every day | ORAL | 1 refills | Status: DC
  Filled 2022-11-19 – 2023-02-18 (×3): qty 90, 90d supply, fill #0

## 2022-12-30 ENCOUNTER — Other Ambulatory Visit (HOSPITAL_COMMUNITY): Payer: Self-pay

## 2022-12-31 ENCOUNTER — Other Ambulatory Visit: Payer: Self-pay

## 2023-01-20 ENCOUNTER — Encounter: Payer: Self-pay | Admitting: Nurse Practitioner

## 2023-01-21 ENCOUNTER — Telehealth: Payer: Managed Care, Other (non HMO) | Admitting: Nurse Practitioner

## 2023-01-21 DIAGNOSIS — Z0279 Encounter for issue of other medical certificate: Secondary | ICD-10-CM

## 2023-02-19 ENCOUNTER — Other Ambulatory Visit: Payer: Self-pay

## 2023-02-19 ENCOUNTER — Other Ambulatory Visit (HOSPITAL_COMMUNITY): Payer: Self-pay

## 2023-03-20 ENCOUNTER — Other Ambulatory Visit (HOSPITAL_COMMUNITY): Payer: Self-pay

## 2023-03-20 MED ORDER — AMOXICILLIN 875 MG PO TABS
875.0000 mg | ORAL_TABLET | Freq: Two times a day (BID) | ORAL | 0 refills | Status: DC
  Filled 2023-03-20: qty 20, 10d supply, fill #0

## 2023-03-21 ENCOUNTER — Other Ambulatory Visit (HOSPITAL_COMMUNITY)
Admission: RE | Admit: 2023-03-21 | Discharge: 2023-03-21 | Disposition: A | Discharge status: Home / Self Care | Payer: Managed Care, Other (non HMO) | Source: Ambulatory Visit | Attending: Nurse Practitioner | Admitting: Nurse Practitioner

## 2023-03-21 ENCOUNTER — Encounter: Payer: Self-pay | Admitting: Nurse Practitioner

## 2023-03-21 ENCOUNTER — Ambulatory Visit (INDEPENDENT_AMBULATORY_CARE_PROVIDER_SITE_OTHER): Payer: Managed Care, Other (non HMO) | Admitting: Nurse Practitioner

## 2023-03-21 ENCOUNTER — Other Ambulatory Visit (HOSPITAL_COMMUNITY): Payer: Self-pay

## 2023-03-21 VITALS — BP 110/80 | HR 80 | Temp 97.9°F | Ht 68.0 in | Wt 349.0 lb

## 2023-03-21 DIAGNOSIS — J020 Streptococcal pharyngitis: Secondary | ICD-10-CM

## 2023-03-21 DIAGNOSIS — Z113 Encounter for screening for infections with a predominantly sexual mode of transmission: Secondary | ICD-10-CM

## 2023-03-21 DIAGNOSIS — D509 Iron deficiency anemia, unspecified: Secondary | ICD-10-CM

## 2023-03-21 DIAGNOSIS — E559 Vitamin D deficiency, unspecified: Secondary | ICD-10-CM | POA: Insufficient documentation

## 2023-03-21 DIAGNOSIS — R7303 Prediabetes: Secondary | ICD-10-CM | POA: Diagnosis not present

## 2023-03-21 LAB — POCT URINALYSIS DIPSTICK
Glucose, UA: NEGATIVE
Nitrite, UA: NEGATIVE
Protein, UA: POSITIVE — AB
Spec Grav, UA: 1.025 (ref 1.010–1.025)
Urobilinogen, UA: 2 U/dL — AB
pH, UA: 6 (ref 5.0–8.0)

## 2023-03-21 MED ORDER — LEVOFLOXACIN 750 MG PO TABS
750.0000 mg | ORAL_TABLET | Freq: Every day | ORAL | 0 refills | Status: DC
  Filled 2023-03-21: qty 5, 5d supply, fill #0

## 2023-03-21 NOTE — Patient Instructions (Signed)
It was great to see you!  We are checking your labs today and will let you know the results via mychart/phone.   Keep taking your amoxicillin and I will let you know the other results.   Let's follow-up with any concerns.   Take care,  Rodman Pickle, NP

## 2023-03-21 NOTE — Assessment & Plan Note (Signed)
Check vitamin D levels and treat based on results.

## 2023-03-21 NOTE — Assessment & Plan Note (Signed)
She has experienced significant weight loss, 39 pounds, since her last visit in May. We encourage the continuation of healthy habits and increased physical activity.

## 2023-03-21 NOTE — Assessment & Plan Note (Addendum)
Chronic, stable. We will check A1C to monitor her prediabetes status.

## 2023-03-21 NOTE — Progress Notes (Signed)
Established Patient Office Visit  Subjective   Patient ID: Mary Brandt, female    DOB: 1985/11/23  Age: 37 y.o. MRN: [id number]  Chief Complaint  Patient presents with   Exposure to STD    Wants to get STD testing.      HPI:  Discussed the use of AI scribe software for clinical note transcription with the patient, who gave verbal consent to proceed.  History of Present Illness   The patient, with a history of prediabetes, presents with a sore throat that started the previous day. She sought care at an urgent care center where a strep test was positive. She was started on amoxicillin for the strep throat. The patient also requested STI testing at the urgent care center. She was informed that she had a bacterial infection that was sexually transmitted, but no further details were provided. The patient denies any vaginal discharge, itching, pain, or burning during urination. She is currently menstruating.  In the past few months, the patient has separated from her husband and moved. She has also lost 39 pounds, which she attributes to changes in eating habits, increased physical activity, and decreased appetite related to the stress of her separation. She denies any other symptoms or concerns.        ROS See pertinent positives and negatives per HPI.    Objective:     BP 110/80   Pulse 80   Temp 97.9 F (36.6 C) (Temporal)   Ht 5\' 8"  (1.727 m)   Wt (!) 349 lb (158.3 kg)   SpO2 98%   BMI 53.07 kg/m  BP Readings from Last 3 Encounters:  03/21/23 110/80  09/27/22 118/86  05/22/22 112/70   Wt Readings from Last 3 Encounters:  03/21/23 (!) 349 lb (158.3 kg)  09/27/22 (!) 388 lb (176 kg)  05/22/22 (!) 401 lb 12.8 oz (182.3 kg)      Physical Exam Vitals and nursing note reviewed.  Constitutional:      General: She is not in acute distress.    Appearance: Normal appearance.  HENT:     Head: Normocephalic.  Eyes:     Conjunctiva/sclera: Conjunctivae normal.   Cardiovascular:     Rate and Rhythm: Normal rate and regular rhythm.     Pulses: Normal pulses.     Heart sounds: Normal heart sounds.  Pulmonary:     Effort: Pulmonary effort is normal.     Breath sounds: Normal breath sounds.  Musculoskeletal:     Cervical back: Normal range of motion.  Skin:    General: Skin is warm.  Neurological:     General: No focal deficit present.     Mental Status: She is alert and oriented to person, place, and time.  Psychiatric:        Mood and Affect: Mood normal.        Behavior: Behavior normal.        Thought Content: Thought content normal.        Judgment: Judgment normal.      Assessment & Plan:   Problem List Items Addressed This Visit       Other   Morbid obesity (HCC)    She has experienced significant weight loss, 39 pounds, since her last visit in May. We encourage the continuation of healthy habits and increased physical activity.       Prediabetes    Chronic, stable. We will check A1C to monitor her prediabetes status.      Relevant  Orders   Comprehensive metabolic panel   Hemoglobin A1c   Iron deficiency anemia - Primary    Chronic, stable. Check CBC and iron panel and treat based on results.      Relevant Orders   CBC with Differential/Platelet   Cervicovaginal ancillary only   Iron, TIBC and Ferritin Panel   Vitamin D insufficiency    Check vitamin D levels and treat based on results.       Relevant Orders   VITAMIN D 25 Hydroxy (Vit-D Deficiency, Fractures)   Other Visit Diagnoses     Screen for STD (sexually transmitted disease)       She was told by urgent care she had a bacteria that was sexually transmitted but not STD. Will check u/a, urine culture, G/C, trich, BV and yeast today   Relevant Orders   POCT urinalysis dipstick (Completed)   Urine Culture   HIV Antibody (routine testing w rflx)   RPR   Strep pharyngitis       She tested positive for strep throat at urgent care and began treatment with  Amoxicillin. We will continue Amoxicillin 850mg  twice daily for 10 days.      Return if symptoms worsen or fail to improve.    Gerre Scull, NP

## 2023-03-21 NOTE — Assessment & Plan Note (Signed)
Chronic, stable. Check CBC and iron panel and treat based on results.

## 2023-03-22 LAB — COMPREHENSIVE METABOLIC PANEL
ALT: 16 U/L (ref 0–35)
AST: 16 U/L (ref 0–37)
Albumin: 4.1 g/dL (ref 3.5–5.2)
Alkaline Phosphatase: 107 U/L (ref 39–117)
BUN: 14 mg/dL (ref 6–23)
CO2: 28 meq/L (ref 19–32)
Calcium: 9 mg/dL (ref 8.4–10.5)
Chloride: 101 meq/L (ref 96–112)
Creatinine, Ser: 0.74 mg/dL (ref 0.40–1.20)
GFR: 102.72 mL/min (ref >60.00)
Glucose, Bld: 94 mg/dL (ref 70–99)
Potassium: 3.5 meq/L (ref 3.5–5.1)
Sodium: 138 meq/L (ref 135–145)
Total Bilirubin: 0.4 mg/dL (ref 0.2–1.2)
Total Protein: 7.3 g/dL (ref 6.0–8.3)

## 2023-03-22 LAB — URINE CULTURE
MICRO NUMBER:: 15731294
SPECIMEN QUALITY:: ADEQUATE

## 2023-03-22 LAB — VITAMIN D 25 HYDROXY (VIT D DEFICIENCY, FRACTURES): VITD: 32.54 ng/mL (ref 30.00–100.00)

## 2023-03-22 LAB — CBC WITH DIFFERENTIAL/PLATELET
Basophils Absolute: 0.1 10*3/uL (ref 0.0–0.1)
Basophils Relative: 0.4 % (ref 0.0–3.0)
Eosinophils Absolute: 0.2 10*3/uL (ref 0.0–0.7)
Eosinophils Relative: 1.6 % (ref 0.0–5.0)
HCT: 44.6 % (ref 36.0–46.0)
Hemoglobin: 14.1 g/dL (ref 12.0–15.0)
Lymphocytes Relative: 13.6 % (ref 12.0–46.0)
Lymphs Abs: 1.7 10*3/uL (ref 0.7–4.0)
MCHC: 31.7 g/dL (ref 30.0–36.0)
MCV: 90.7 fL (ref 78.0–100.0)
Monocytes Absolute: 1.2 10*3/uL — ABNORMAL HIGH (ref 0.1–1.0)
Monocytes Relative: 9.9 % (ref 3.0–12.0)
Neutro Abs: 9.1 10*3/uL — ABNORMAL HIGH (ref 1.4–7.7)
Neutrophils Relative %: 74.5 % (ref 43.0–77.0)
Platelets: 382 10*3/uL (ref 150.0–400.0)
RBC: 4.92 Mil/uL (ref 3.87–5.11)
RDW: 15.8 % — ABNORMAL HIGH (ref 11.5–15.5)
WBC: 12.2 10*3/uL — ABNORMAL HIGH (ref 4.0–10.5)

## 2023-03-22 LAB — IRON,TIBC AND FERRITIN PANEL
%SAT: 4 % — ABNORMAL LOW (ref 16–45)
Ferritin: 85 ng/mL (ref 16–154)
Iron: 13 ug/dL — ABNORMAL LOW (ref 40–190)
TIBC: 355 ug/dL (ref 250–450)

## 2023-03-22 LAB — HIV ANTIBODY (ROUTINE TESTING W REFLEX): HIV 1&2 Ab, 4th Generation: NONREACTIVE

## 2023-03-22 LAB — EXTRA URINE SPECIMEN

## 2023-03-22 LAB — RPR: RPR Ser Ql: NONREACTIVE

## 2023-03-22 LAB — HEMOGLOBIN A1C: Hgb A1c MFr Bld: 5.9 % (ref 4.6–6.5)

## 2023-03-25 LAB — CERVICOVAGINAL ANCILLARY ONLY
Bacterial Vaginitis (gardnerella): NEGATIVE
Candida Glabrata: NEGATIVE
Candida Vaginitis: NEGATIVE
Chlamydia: NEGATIVE
Comment: NEGATIVE
Comment: NEGATIVE
Comment: NEGATIVE
Comment: NEGATIVE
Comment: NEGATIVE
Comment: NORMAL
Neisseria Gonorrhea: NEGATIVE
Trichomonas: NEGATIVE

## 2023-03-28 ENCOUNTER — Telehealth: Payer: Self-pay | Admitting: Nurse Practitioner

## 2023-03-29 NOTE — Telephone Encounter (Signed)
error 

## 2023-04-01 ENCOUNTER — Other Ambulatory Visit (HOSPITAL_COMMUNITY): Payer: Self-pay

## 2023-04-05 ENCOUNTER — Other Ambulatory Visit (HOSPITAL_COMMUNITY): Payer: Self-pay

## 2023-04-10 ENCOUNTER — Other Ambulatory Visit (HOSPITAL_COMMUNITY): Payer: Self-pay

## 2023-04-10 MED ORDER — FLUOXETINE HCL 20 MG PO CAPS
20.0000 mg | ORAL_CAPSULE | Freq: Every day | ORAL | 1 refills | Status: DC
  Filled 2023-04-10: qty 30, 30d supply, fill #0
  Filled 2023-05-06: qty 30, 30d supply, fill #1

## 2023-05-06 ENCOUNTER — Other Ambulatory Visit (HOSPITAL_COMMUNITY): Payer: Self-pay

## 2023-05-06 ENCOUNTER — Other Ambulatory Visit: Payer: Self-pay

## 2023-05-13 ENCOUNTER — Other Ambulatory Visit (HOSPITAL_COMMUNITY): Payer: Self-pay

## 2023-05-13 MED ORDER — FLUOXETINE HCL 20 MG PO CAPS
20.0000 mg | ORAL_CAPSULE | Freq: Every day | ORAL | 0 refills | Status: DC
  Filled 2023-05-13 – 2023-06-19 (×2): qty 90, 90d supply, fill #0

## 2023-05-15 ENCOUNTER — Other Ambulatory Visit (HOSPITAL_COMMUNITY): Payer: Self-pay

## 2023-05-26 ENCOUNTER — Telehealth: Payer: BC Managed Care – PPO | Admitting: Physician Assistant

## 2023-05-26 ENCOUNTER — Ambulatory Visit
Admission: RE | Admit: 2023-05-26 | Discharge: 2023-05-26 | Disposition: A | Discharge status: Home / Self Care | Payer: BC Managed Care – PPO | Source: Ambulatory Visit | Attending: Family Medicine | Admitting: Family Medicine

## 2023-05-26 VITALS — BP 110/76 | HR 102 | Temp 98.6°F | Resp 20

## 2023-05-26 DIAGNOSIS — N61 Mastitis without abscess: Secondary | ICD-10-CM

## 2023-05-26 DIAGNOSIS — N644 Mastodynia: Secondary | ICD-10-CM

## 2023-05-26 MED ORDER — CEFTRIAXONE SODIUM 1 G IJ SOLR
1000.0000 mg | Freq: Once | INTRAMUSCULAR | Status: AC
  Administered 2023-05-26: 1000 mg via INTRAMUSCULAR

## 2023-05-26 MED ORDER — AMOXICILLIN-POT CLAVULANATE 875-125 MG PO TABS
1.0000 | ORAL_TABLET | Freq: Two times a day (BID) | ORAL | 0 refills | Status: DC
  Filled 2023-05-26: qty 14, 7d supply, fill #0

## 2023-05-26 MED ORDER — AMOXICILLIN-POT CLAVULANATE 875-125 MG PO TABS: 1.0000 | ORAL_TABLET | Freq: Two times a day (BID) | ORAL | 0 refills | Status: DC

## 2023-05-26 NOTE — ED Notes (Signed)
Patient monitored post IM rocephin injection; tolerated well.

## 2023-05-26 NOTE — Discharge Instructions (Signed)
Take Augmentin 2 times a day for 7 days Warm compresses to area May take Tylenol (acetaminophen) 1000 mg alternating with Motrin (ibuprofen) 800 mg for pain May take each of these 3 times a day See your doctor if not improved couple of days Go to ER if worse, spiking fevers

## 2023-05-26 NOTE — ED Triage Notes (Addendum)
Patient presents to [facility] for lest breast nipple drainage and pain. States she had got a piercing and now concerned with infection. Virtual visit today instructed to come in for eval. Had advil about an hr ago for pain.

## 2023-05-26 NOTE — ED Provider Notes (Signed)
[facility]    CSN: [id number] Arrival date & time: 05/26/23  1318      History   Chief Complaint Chief Complaint  Patient presents with   Breast Problem    I got my nipples pierced a little over a month ago and I think my left nipple may be infected. There is some discharge and my entire breast hurts. - Entered by patient    HPI Mary Brandt is a 38 y.o. female.   Patient states that she had her nipples pierced about a month ago.  This went without complication.  For the last day she has had increasing pain and swelling and some minor discharge in her left nipple.  She is worried about the piercing being infected.  She has never had this before.  She is also had some chills and malaise.  No fever.  No nausea or vomiting    Past Medical History:  Diagnosis Date   Anxiety    Asthma    Hyperglycemia    Hyperlipemia    Morbid obesity (HCC)    sees weight loss clinic   Nevus    sees dermatologist, Dr. Danella Deis   Obstructive sleep apnea on CPAP     Patient Active Problem List   Diagnosis Date Noted   Vitamin D insufficiency 03/21/2023   Iron deficiency anemia 09/27/2022   Routine general medical examination at a health care facility 09/27/2022   History of bariatric surgery 09/27/2022   Prediabetes 04/17/2021   Nutritional counseling 03/14/2018   OSA (obstructive sleep apnea) 03/22/2016   Low serum iron 12/01/2015   Morbid obesity (HCC) 01/26/2014   GERD (gastroesophageal reflux disease) 01/26/2014   Generalized anxiety disorder 01/26/2014    Past Surgical History:  Procedure Laterality Date   finger laceration Right 2008   2nd finger   implanon insertion  06-26-11   left upper arm   LAPAROSCOPIC GASTRIC SLEEVE RESECTION N/A 01/05/2019   Procedure: LAPAROSCOPIC GASTRIC SLEEVE RESECTION, UPPER ENDO, ERAS Pathway;  Surgeon: Berna Bue, MD;  Location: [facility];  Service: General;  Laterality: N/A;   MOLE REMOVAL     OTHER SURGICAL HISTORY Left  [id number]    Implanon   WISDOM TOOTH EXTRACTION      OB History     Gravida  0   Para  0   Term  0   Preterm  0   AB  0   Living  0      SAB  0   IAB  0   Ectopic  0   Multiple  0   Live Births               Home Medications    Prior to Admission medications   Medication Sig Start Date End Date Taking? Authorizing Provider  amoxicillin-clavulanate (AUGMENTIN) 875-125 MG tablet Take 1 tablet by mouth every 12 (twelve) hours. 05/26/23  Yes Eustace Moore, MD  esomeprazole (NEXIUM) 40 MG capsule Take 1 capsule (40 mg total) by mouth daily. 09/27/22   McElwee, Lauren A, NP  FLUoxetine (PROZAC) 20 MG capsule Take 1 capsule (20 mg total) by mouth daily. 05/13/23     FLUoxetine (PROZAC) 40 MG capsule Take 1 capsule (40 mg total) by mouth daily. 11/19/22     IRON-FOLIC ACID PO Take 60 mg by mouth.    [provider]  Lavender Oil 80 MG CAPS  04/06/22   [provider]  levonorgestrel (MIRENA) 20 MCG/24HR IUD 1  each by Intrauterine route once.    [provider]  Multiple Vitamins-Minerals (MULTIVITAMIN ADULT PO) Take 1 tablet by mouth daily.     [provider]    Family History Family History  Problem Relation Age of Onset   Atrial fibrillation Mother    Diabetes Father    Cancer Father        skin   CVA Father    Polycystic ovary syndrome Sister    Depression Sister    Anxiety disorder Sister    Bipolar disorder Sister    Dementia Maternal Grandmother    Cancer Maternal Grandfather        skin    Social History Social History   Tobacco Use   Smoking status: Never   Smokeless tobacco: Never  Vaping Use   Vaping status: Never Used  Substance Use Topics   Alcohol use: Not Currently   Drug use: No     Allergies   Patient has no known allergies.   Review of Systems Review of Systems See HPI  Physical Exam Triage Vital Signs ED Triage Vitals  Encounter Vitals Group     BP 05/26/23 1409 110/76      Systolic BP Percentile --      Diastolic BP Percentile --      Pulse Rate 05/26/23 1409 (!) 102     Resp 05/26/23 1409 20     Temp 05/26/23 1409 98.6 F (37 C)     Temp Source 05/26/23 1409 Oral     SpO2 05/26/23 1409 98 %     Weight --      Height --      Head Circumference --      Peak Flow --      Pain Score 05/26/23 1400 8     Pain Loc --      Pain Education --      Exclude from Growth Chart --    No data found.  Updated Vital Signs BP 110/76 (BP Location: Left Arm)   Pulse (!) 102   Temp 98.6 F (37 C) (Oral)   Resp 20   SpO2 98%       Physical Exam Constitutional:      General: She is not in acute distress.    Appearance: She is well-developed.  HENT:     Head: Normocephalic and atraumatic.  Eyes:     Conjunctiva/sclera: Conjunctivae normal.     Pupils: Pupils are equal, round, and reactive to light.  Cardiovascular:     Rate and Rhythm: Normal rate.  Pulmonary:     Effort: Pulmonary effort is normal. No respiratory distress.  Chest:    Abdominal:     General: There is no distension.     Palpations: Abdomen is soft.  Musculoskeletal:        General: Normal range of motion.     Cervical back: Normal range of motion.  Skin:    General: Skin is warm and dry.  Neurological:     Mental Status: She is alert.      UC Treatments / Results  Labs (all labs ordered are listed, but only abnormal results are displayed) Labs Reviewed - No data to display  EKG   Radiology No results found.  Procedures Procedures (including critical care time)  Medications Ordered in UC Medications  cefTRIAXone (ROCEPHIN) injection 1,000 mg (has no administration in time range)    Initial Impression / Assessment and Plan / UC Course  I have  reviewed the triage vital signs and the nursing notes.  Pertinent labs & imaging results that were available during my care of the patient were reviewed by me and considered in my medical decision making (see chart for  details).     Patient has more of a localized mastitis.  Possible early abscess formation.  I do not see any cellulitis or infection from the piercing.  Concerned because she has some malaise and chills.  Will treat with antibiotics with instructions to go to ER if worse Final Clinical Impressions(s) / UC Diagnoses   Final diagnoses:  Mastitis in female     Discharge Instructions      Take Augmentin 2 times a day for 7 days Warm compresses to area May take Tylenol (acetaminophen) 1000 mg alternating with Motrin (ibuprofen) 800 mg for pain May take each of these 3 times a day See your doctor if not improved couple of days Go to ER if worse, spiking fevers   ED Prescriptions     Medication Sig Dispense Auth. Provider   amoxicillin-clavulanate (AUGMENTIN) 875-125 MG tablet Take 1 tablet by mouth every 12 (twelve) hours. 14 tablet Eustace Moore, MD      PDMP not reviewed this encounter.   Eustace Moore, MD 05/26/23 [phone]

## 2023-05-26 NOTE — Progress Notes (Signed)
Virtual Visit Consent   Mary Brandt, you are scheduled for a virtual visit with a [facility] provider today. Just as with appointments in the office, your consent must be obtained to participate. Your consent will be active for this visit and any virtual visit you may have with one of our providers in the next 365 days. If you have a MyChart account, a copy of this consent can be sent to you electronically.  As this is a virtual visit, video technology does not allow for your provider to perform a traditional examination. This may limit your provider's ability to fully assess your condition. If your provider identifies any concerns that need to be evaluated in person or the need to arrange testing (such as labs, EKG, etc.), we will make arrangements to do so. Although advances in technology are sophisticated, we cannot ensure that it will always work on either your end or our end. If the connection with a video visit is poor, the visit may have to be switched to a telephone visit. With either a video or telephone visit, we are not always able to ensure that we have a secure connection.  By engaging in this virtual visit, you consent to the provision of healthcare and authorize for your insurance to be billed (if applicable) for the services provided during this visit. Depending on your insurance coverage, you may receive a charge related to this service.  I need to obtain your verbal consent now. Are you willing to proceed with your visit today? Mary Brandt has provided verbal consent on 05/26/2023 for a virtual visit (video or telephone). Laure Kidney, [place]  Date: 05/26/2023 9:38 AM  Virtual Visit via Video Note   I, Laure Kidney, connected with  Mary Brandt  ([id number], Jul 21, 1985) on 05/26/23 at  9:30 AM EST by a video-enabled telemedicine application and verified that I am speaking with the correct person using two identifiers.  Location: Patient: Virtual Visit Location Patient:  Home Provider: Virtual Visit Location Provider: Home Office   I discussed the limitations of evaluation and management by telemedicine and the availability of in person appointments. The patient expressed understanding and agreed to proceed.    History of Present Illness: Mary Brandt is a 38 y.o. who identifies as a female who was assigned female at birth, and is being seen today for breast pain after nipple piercing 1 month prior. States 1.5 weeks ago she accidentally "yanked" on on her left nipple. She states since last night she's noticed weeping at the area and her entire breast is  sore. She states her left breast is worst and that the pain is 8/10. She states its made better with support from her bra. Denies fevers, chills, drainage or any other contributory symptoms.    Problems:  Patient Active Problem List   Diagnosis Date Noted   Vitamin D insufficiency 03/21/2023   Iron deficiency anemia 09/27/2022   Routine general medical examination at a health care facility 09/27/2022   History of bariatric surgery 09/27/2022   Prediabetes 04/17/2021   Nutritional counseling 03/14/2018   OSA (obstructive sleep apnea) 03/22/2016   Low serum iron 12/01/2015   Morbid obesity (HCC) 01/26/2014   GERD (gastroesophageal reflux disease) 01/26/2014   Generalized anxiety disorder 01/26/2014    Allergies: No Known Allergies Medications:  Current Outpatient Medications:    amoxicillin (AMOXIL) 875 MG tablet, Take 1 tablet (875 mg total) by mouth 2 (two) times daily for 10 days., Disp: 20 tablet,  Rfl: 0   esomeprazole (NEXIUM) 40 MG capsule, Take 1 capsule (40 mg total) by mouth daily., Disp: 90 capsule, Rfl: 3   FLUoxetine (PROZAC) 20 MG capsule, Take 1 capsule (20 mg total) by mouth daily., Disp: 90 capsule, Rfl: 0   FLUoxetine (PROZAC) 40 MG capsule, Take 1 capsule (40 mg total) by mouth daily., Disp: 90 capsule, Rfl: 1   IRON-FOLIC ACID PO, Take 60 mg by mouth., Disp: , Rfl:    Lavender Oil  80 MG CAPS, , Disp: , Rfl:    levofloxacin (LEVAQUIN) 750 MG tablet, Take 1 tablet (750 mg total) by mouth daily for 5 days., Disp: 5 tablet, Rfl: 0   levonorgestrel (MIRENA) 20 MCG/24HR IUD, 1 each by Intrauterine route once., Disp: , Rfl:    Multiple Vitamins-Minerals (MULTIVITAMIN ADULT PO), Take 1 tablet by mouth daily. , Disp: , Rfl:   Observations/Objective: Patient is well-developed, well-nourished in no acute distress.  Resting comfortably at home.  Head is normocephalic, atraumatic.  No labored breathing.  Speech is clear and coherent with logical content.  Patient is alert and oriented at baseline.    Assessment and Plan: 1. Breast pain (Primary)  Breast pain after nipple piercing with trauma. Concern for possible cellulitis or abscess. Patient advised to present to nearest UC or ER for evaluation as she needs a breast exam at minimum. She verbalized understanding. All questions answered and patient to report to nearest UC/ER for eval.  Follow Up Instructions: I discussed the assessment and treatment plan with the patient. The patient was provided an opportunity to ask questions and all were answered. The patient agreed with the plan and demonstrated an understanding of the instructions.  A copy of instructions were sent to the patient via MyChart unless otherwise noted below.    The patient was advised to call back or seek an in-person evaluation if the symptoms worsen or if the condition fails to improve as anticipated.    Laure Kidney, PA-C

## 2023-05-27 ENCOUNTER — Other Ambulatory Visit (HOSPITAL_COMMUNITY): Payer: Self-pay

## 2023-05-28 ENCOUNTER — Other Ambulatory Visit (HOSPITAL_COMMUNITY): Payer: Self-pay

## 2023-05-31 ENCOUNTER — Other Ambulatory Visit (HOSPITAL_COMMUNITY): Payer: Self-pay

## 2023-06-07 ENCOUNTER — Other Ambulatory Visit (HOSPITAL_COMMUNITY): Payer: Self-pay

## 2023-06-19 ENCOUNTER — Other Ambulatory Visit (HOSPITAL_COMMUNITY): Payer: Self-pay

## 2023-06-19 ENCOUNTER — Other Ambulatory Visit: Payer: Self-pay

## 2023-07-12 ENCOUNTER — Ambulatory Visit: Admitting: Urgent Care

## 2023-07-12 ENCOUNTER — Encounter: Payer: Self-pay | Admitting: Urgent Care

## 2023-07-12 VITALS — BP 103/71 | HR 80 | Temp 97.8°F | Wt 350.8 lb

## 2023-07-12 DIAGNOSIS — Z113 Encounter for screening for infections with a predominantly sexual mode of transmission: Secondary | ICD-10-CM

## 2023-07-12 DIAGNOSIS — R8290 Unspecified abnormal findings in urine: Secondary | ICD-10-CM

## 2023-07-12 DIAGNOSIS — N760 Acute vaginitis: Secondary | ICD-10-CM

## 2023-07-12 LAB — POC URINALSYSI DIPSTICK (AUTOMATED)
Bilirubin, UA: NEGATIVE
Blood, UA: POSITIVE
Glucose, UA: NEGATIVE
Ketones, UA: NEGATIVE
Nitrite, UA: NEGATIVE
Protein, UA: POSITIVE — AB
Spec Grav, UA: 1.025 (ref 1.010–1.025)
Urobilinogen, UA: 0.2 U/dL
pH, UA: 6 (ref 5.0–8.0)

## 2023-07-12 MED ORDER — FLUCONAZOLE 150 MG PO TABS
ORAL_TABLET | ORAL | 0 refills | Status: DC
Start: 2023-07-12 — End: 2023-10-07

## 2023-07-12 MED ORDER — METRONIDAZOLE 500 MG PO TABS: 500.0000 mg | ORAL_TABLET | Freq: Two times a day (BID) | ORAL | 0 refills | Status: AC

## 2023-07-12 NOTE — Patient Instructions (Signed)
 You have vaginitis - please read the attached handout. You were tested today for gonorrhea, chlamydia, trichomonas, BV, and yeast. You were also tested for syphilis, Hep C and HIV. We will call you with the results of your test once received.  Please avoid all forms of intercourse until test results received. As always, practice safe sexual practices by using protection each and every time, and limiting number of partners.   Please start taking flagyl twice daily x 7 days. Do not drink any alcohol while taking this medication. Take one tablet of diflucan today and one in one week if vaginal itching persists.

## 2023-07-12 NOTE — Progress Notes (Signed)
 Established Patient Office Visit  Subjective:  Patient ID: Mary Brandt, female    DOB: 07/11/1985  Age: 38 y.o. MRN: [id number]  Chief Complaint  Patient presents with   Vaginal Itching    She has been having vaginal itching for about 2 days along with cloudy urine and frequent urination. Pt desires all std testing today.    38yo female presents with complaints of vaginal discharge, itching and abnormal urine odor for the past two days. Reports white creamy discharge. No pelvic pain. No hx of DM. No OTC tx tried for current symptoms. Denies dysuria, but reports increased frequency of urination along with cloudy urine. No hematuria or flank pain. She is requesting full STI panel. Reports several new partners in the past few months.  Vaginal Itching    Patient Active Problem List   Diagnosis Date Noted   Vitamin D insufficiency 03/21/2023   Iron deficiency anemia 09/27/2022   Routine general medical examination at a health care facility 09/27/2022   History of bariatric surgery 09/27/2022   Prediabetes 04/17/2021   Nutritional counseling 03/14/2018   OSA (obstructive sleep apnea) 03/22/2016   Low serum iron 12/01/2015   Morbid obesity (HCC) 01/26/2014   GERD (gastroesophageal reflux disease) 01/26/2014   Generalized anxiety disorder 01/26/2014   Past Medical History:  Diagnosis Date   Anxiety    Asthma    Hyperglycemia    Hyperlipemia    Morbid obesity (HCC)    sees weight loss clinic   Nevus    sees dermatologist, Dr. Danella Deis   Obstructive sleep apnea on CPAP    Past Surgical History:  Procedure Laterality Date   finger laceration Right 2008   2nd finger   implanon insertion  06-26-11   left upper arm   LAPAROSCOPIC GASTRIC SLEEVE RESECTION N/A 01/05/2019   Procedure: LAPAROSCOPIC GASTRIC SLEEVE RESECTION, UPPER ENDO, ERAS Pathway;  Surgeon: Berna Bue, MD;  Location: [facility];  Service: General;  Laterality: N/A;   MOLE REMOVAL     OTHER SURGICAL HISTORY  Left [id number]    Implanon   WISDOM TOOTH EXTRACTION     Social History   Tobacco Use   Smoking status: Never   Smokeless tobacco: Never  Vaping Use   Vaping status: Never Used  Substance Use Topics   Alcohol use: Not Currently   Drug use: No      ROS: as noted in HPI  Objective:     BP 103/71   Pulse 80   Temp 97.8 F (36.6 C) (Oral)   Wt (!) 350 lb 12.8 oz (159.1 kg)   SpO2 97%   BMI 53.34 kg/m  BP Readings from Last 3 Encounters:  07/12/23 103/71  05/26/23 110/76  03/21/23 110/80   Wt Readings from Last 3 Encounters:  07/12/23 (!) 350 lb 12.8 oz (159.1 kg)  03/21/23 (!) 349 lb (158.3 kg)  09/27/22 (!) 388 lb (176 kg)      Physical Exam Vitals and nursing note reviewed. Exam conducted with a chaperone present.  Constitutional:      General: She is not in acute distress.    Appearance: Normal appearance. She is obese. She is not ill-appearing, toxic-appearing or diaphoretic.  HENT:     Head: Normocephalic.  Cardiovascular:     Rate and Rhythm: Normal rate.  Pulmonary:     Effort: Pulmonary effort is normal. No respiratory distress.  Abdominal:     Palpations: Abdomen is soft.     Tenderness: There is  no guarding or rebound.  Genitourinary:    Pubic Area: No rash or pubic lice.      Labia:        Right: No rash, tenderness, lesion or injury.        Left: No rash, tenderness, lesion or injury.      Urethra: No prolapse, urethral pain, urethral swelling or urethral lesion.     Vagina: Vaginal discharge (copious yellowish-white discharge) present. No lesions.     Cervix: Discharge present. No cervical motion tenderness, friability or lesion.     Uterus: Normal.      Adnexa: Right adnexa normal and left adnexa normal.       Right: No mass, tenderness or fullness.         Left: No mass, tenderness or fullness.       Rectum: Normal.  Lymphadenopathy:     Lower Body: No right inguinal adenopathy. No left inguinal adenopathy.  Neurological:     Mental  Status: She is alert.      Results for orders placed or performed in visit on 07/12/23  POCT Urinalysis Dipstick (Automated)  Result Value Ref Range   Color, UA yellow    Clarity, UA cloudy    Glucose, UA Negative Negative   Bilirubin, UA negative    Ketones, UA negative    Spec Grav, UA 1.025 1.010 - 1.025   Blood, UA positive    pH, UA 6.0 5.0 - 8.0   Protein, UA Positive (A) Negative   Urobilinogen, UA 0.2 0.2 or 1.0 E.U./dL   Nitrite, UA negative    Leukocytes, UA Large (3+) (A) Negative    Last CBC Lab Results  Component Value Date   WBC 12.2 (H) 03/21/2023   HGB 14.1 03/21/2023   HCT 44.6 03/21/2023   MCV 90.7 03/21/2023   MCH 27.1 01/06/2019   RDW 15.8 (H) 03/21/2023   PLT 382.0 03/21/2023   Last metabolic panel Lab Results  Component Value Date   GLUCOSE 94 03/21/2023   NA 138 03/21/2023   K 3.5 03/21/2023   CL 101 03/21/2023   CO2 28 03/21/2023   BUN 14 03/21/2023   CREATININE 0.74 03/21/2023   GFR 102.72 03/21/2023   CALCIUM 9.0 03/21/2023   PROT 7.3 03/21/2023   ALBUMIN 4.1 03/21/2023   BILITOT 0.4 03/21/2023   ALKPHOS 107 03/21/2023   AST 16 03/21/2023   ALT 16 03/21/2023   ANIONGAP 12 01/06/2019      The ASCVD Risk score (Arnett DK, et al., 2019) failed to calculate for the following reasons:   The 2019 ASCVD risk score is only valid for ages 83 to 44  Assessment & Plan:  Abnormal urine odor -     POCT Urinalysis Dipstick (Automated)  Acute vaginitis -     NuSwab Vaginitis Plus (VG+) -     metroNIDAZOLE; Take 1 tablet (500 mg total) by mouth 2 (two) times daily for 7 days.  Dispense: 14 tablet; Refill: 0 -     Fluconazole; One tab PO x 1 dose today. One tab PO x 1 dose in one week  Dispense: 2 tablet; Refill: 0  Screen for STD (sexually transmitted disease) -     HIV Antibody (routine testing w rflx) -     Hepatitis C antibody -     RPR   Will cover for both trichomonas and yeast given current symptoms and physical exam findings.  Nuswab collected. Pt to avoid intercourse until results received. UA likely  contamination from discharge - will send out culture and start abx if indicated based upon culture results.   No follow-ups on file.   Maretta Bees, PA

## 2023-07-13 LAB — HIV ANTIBODY (ROUTINE TESTING W REFLEX): HIV 1&2 Ab, 4th Generation: NONREACTIVE

## 2023-07-13 LAB — HEPATITIS C ANTIBODY: Hepatitis C Ab: NONREACTIVE

## 2023-07-13 LAB — RPR: RPR Ser Ql: NONREACTIVE

## 2023-07-14 LAB — URINE CULTURE
MICRO NUMBER:: 16175013
SPECIMEN QUALITY:: ADEQUATE

## 2023-07-15 ENCOUNTER — Encounter: Payer: Self-pay | Admitting: Urgent Care

## 2023-07-17 ENCOUNTER — Telehealth: Payer: Self-pay

## 2023-07-17 LAB — NUSWAB VAGINITIS PLUS (VG+)
Atopobium vaginae: HIGH {score} — AB
Chlamydia trachomatis, NAA: NEGATIVE
Megasphaera 1: HIGH {score} — AB
Neisseria gonorrhoeae, NAA: NEGATIVE
Trich vag by NAA: POSITIVE — AB

## 2023-07-17 NOTE — Telephone Encounter (Signed)
 MyChart message read.

## 2023-07-17 NOTE — Telephone Encounter (Signed)
 Spoke with pt and she saw that the swab results have returned. Pt advised that we would call or send her a message in mychart once you reviewed the results. Pt is ok with mychart.   Copied from CRM [id number]. Topic: Clinical - Lab/Test Results >> Jul 17, 2023  8:37 AM Orinda Kenner C wrote: Reason for CRM: Patient [phone] wants to speak with nurse in detatils about lab results. Please call back.

## 2023-08-05 ENCOUNTER — Other Ambulatory Visit (HOSPITAL_COMMUNITY): Payer: Self-pay

## 2023-08-05 MED ORDER — HYDROXYZINE HCL 10 MG PO TABS
10.0000 mg | ORAL_TABLET | Freq: Three times a day (TID) | ORAL | 0 refills | Status: DC | PRN
Start: 2023-08-05 — End: 2023-10-07
  Filled 2023-08-05: qty 60, 10d supply, fill #0

## 2023-08-05 MED ORDER — FLUOXETINE HCL 20 MG PO CAPS: 20.0000 mg | ORAL_CAPSULE | Freq: Every day | ORAL | 1 refills | Status: DC

## 2023-08-09 ENCOUNTER — Encounter (HOSPITAL_COMMUNITY): Payer: Self-pay | Admitting: *Deleted

## 2023-09-02 ENCOUNTER — Other Ambulatory Visit (HOSPITAL_COMMUNITY): Payer: Self-pay

## 2023-09-02 MED ORDER — PROPRANOLOL HCL 10 MG PO TABS
10.0000 mg | ORAL_TABLET | Freq: Two times a day (BID) | ORAL | 1 refills | Status: DC
Start: 2023-09-02 — End: 2023-10-03
  Filled 2023-09-02: qty 60, 15d supply, fill #0
  Filled 2023-10-01: qty 60, 15d supply, fill #1

## 2023-09-02 MED ORDER — DESVENLAFAXINE SUCCINATE ER 50 MG PO TB24
50.0000 mg | ORAL_TABLET | Freq: Every day | ORAL | 1 refills | Status: DC
  Filled 2023-09-02: qty 30, 30d supply, fill #0
  Filled 2023-10-01: qty 30, 30d supply, fill #1

## 2023-09-30 ENCOUNTER — Encounter: Payer: Managed Care, Other (non HMO) | Admitting: Nurse Practitioner

## 2023-10-01 ENCOUNTER — Other Ambulatory Visit (HOSPITAL_COMMUNITY): Payer: Self-pay

## 2023-10-01 ENCOUNTER — Other Ambulatory Visit: Payer: Self-pay | Admitting: Nurse Practitioner

## 2023-10-01 ENCOUNTER — Telehealth: Admitting: Family Medicine

## 2023-10-01 ENCOUNTER — Other Ambulatory Visit: Payer: Self-pay

## 2023-10-01 DIAGNOSIS — J019 Acute sinusitis, unspecified: Secondary | ICD-10-CM

## 2023-10-01 DIAGNOSIS — B9689 Other specified bacterial agents as the cause of diseases classified elsewhere: Secondary | ICD-10-CM

## 2023-10-01 MED ORDER — ESOMEPRAZOLE MAGNESIUM 40 MG PO CPDR
40.0000 mg | DELAYED_RELEASE_CAPSULE | Freq: Every day | ORAL | 0 refills | Status: DC
Start: 2023-10-01 — End: 2023-10-07
  Filled 2023-10-01: qty 30, 30d supply, fill #0

## 2023-10-01 MED ORDER — AMOXICILLIN-POT CLAVULANATE 875-125 MG PO TABS
1.0000 | ORAL_TABLET | Freq: Two times a day (BID) | ORAL | 0 refills | Status: AC
  Filled 2023-10-01: qty 14, 7d supply, fill #0

## 2023-10-01 NOTE — Telephone Encounter (Signed)
 Requesting: esomeprazole  (NEXIUM ) 40 MG capsule  Last Visit: 03/21/2023 Next Visit: Visit date not found Last Refill: 09/27/2022 Patient is due for a CPE.   Please Advise

## 2023-10-01 NOTE — Progress Notes (Signed)

## 2023-10-03 ENCOUNTER — Other Ambulatory Visit (HOSPITAL_COMMUNITY): Payer: Self-pay

## 2023-10-03 MED ORDER — DESVENLAFAXINE SUCCINATE ER 50 MG PO TB24
50.0000 mg | ORAL_TABLET | Freq: Every day | ORAL | 1 refills | Status: DC
  Filled 2023-10-28: qty 90, 90d supply, fill #0
  Filled 2024-01-27: qty 90, 90d supply, fill #1

## 2023-10-03 MED ORDER — PROPRANOLOL HCL 10 MG PO TABS
10.0000 mg | ORAL_TABLET | Freq: Two times a day (BID) | ORAL | 1 refills | Status: DC
  Filled 2023-10-28: qty 60, 15d supply, fill #0
  Filled 2023-11-26: qty 60, 15d supply, fill #1

## 2023-10-07 ENCOUNTER — Ambulatory Visit: Admitting: Nurse Practitioner

## 2023-10-07 ENCOUNTER — Other Ambulatory Visit (HOSPITAL_COMMUNITY): Payer: Self-pay

## 2023-10-07 ENCOUNTER — Other Ambulatory Visit (HOSPITAL_COMMUNITY)
Admission: RE | Admit: 2023-10-07 | Discharge: 2023-10-07 | Disposition: A | Discharge status: Home / Self Care | Source: Ambulatory Visit | Attending: Nurse Practitioner | Admitting: Nurse Practitioner

## 2023-10-07 VITALS — BP 118/80 | HR 78 | Temp 97.3°F | Ht 68.0 in | Wt 360.0 lb

## 2023-10-07 DIAGNOSIS — B9689 Other specified bacterial agents as the cause of diseases classified elsewhere: Secondary | ICD-10-CM

## 2023-10-07 DIAGNOSIS — J019 Acute sinusitis, unspecified: Secondary | ICD-10-CM

## 2023-10-07 DIAGNOSIS — N760 Acute vaginitis: Secondary | ICD-10-CM

## 2023-10-07 MED ORDER — FLUCONAZOLE 150 MG PO TABS
ORAL_TABLET | ORAL | 0 refills | Status: DC
  Filled 2023-10-07: qty 2, 7d supply, fill #0

## 2023-10-07 MED ORDER — ESOMEPRAZOLE MAGNESIUM 40 MG PO CPDR
40.0000 mg | DELAYED_RELEASE_CAPSULE | Freq: Every day | ORAL | 1 refills | Status: DC
  Filled 2023-10-07 – 2023-10-28 (×2): qty 90, 90d supply, fill #0
  Filled 2024-01-27: qty 90, 90d supply, fill #1

## 2023-10-07 MED ORDER — ALBUTEROL SULFATE HFA 108 (90 BASE) MCG/ACT IN AERS
2.0000 | INHALATION_SPRAY | Freq: Four times a day (QID) | RESPIRATORY_TRACT | 0 refills | Status: AC | PRN
  Filled 2023-10-07: qty 6.7, 25d supply, fill #0

## 2023-10-07 NOTE — Progress Notes (Signed)
 Acute Office Visit  Subjective:     Patient ID: Mary Brandt, female    DOB: [birth year], 38 y.o.   MRN: [id number]  Chief Complaint  Patient presents with   possible yeast infection    Says she needs to be tested for yeast infection    HPI Discussed the use of AI scribe software for clinical note transcription with the patient, who gave verbal consent to proceed.  History of Present Illness   DAVA RENSCH is a 38 year old female who presents with symptoms of a yeast infection following antibiotic treatment for a sinus infection.  She completed a course of antibiotics for a sinus infection and now experiences external vaginal itching. There is a slight increase in urinary urgency without dysuria. She denies vaginal discharge. She is concerned about a recurrence of bacterial vaginosis as well. No fever, abdominal pain, pelvic pain, or back pain. She continues to experience congestion, with symptoms progressing to her chest, which is typical for her when ill. No shortness of breath or difficulty breathing. Her allergy-induced asthma has improved with age, and she does not require an inhaler. She has been taking mucinex and feels better than she did 2 days ago.     ROS See pertinent positives and negatives per HPI.     Objective:    BP 118/80 (BP Location: Left Arm, Patient Position: Sitting, Cuff Size: Normal)   Pulse 78   Temp (!) 97.3 F (36.3 C) (Temporal)   Ht 5\' 8"  (1.727 m)   Wt (!) 360 lb (163.3 kg)   SpO2 100%   BMI 54.74 kg/m    Physical Exam Vitals and nursing note reviewed.  Constitutional:      General: She is not in acute distress.    Appearance: Normal appearance.  HENT:     Head: Normocephalic.  Eyes:     Conjunctiva/sclera: Conjunctivae normal.  Cardiovascular:     Rate and Rhythm: Normal rate and regular rhythm.     Pulses: Normal pulses.     Heart sounds: Normal heart sounds.  Pulmonary:     Effort: Pulmonary effort is normal.     Breath  sounds: Normal breath sounds.  Musculoskeletal:     Cervical back: Normal range of motion.  Skin:    General: Skin is warm.  Neurological:     General: No focal deficit present.     Mental Status: She is alert and oriented to person, place, and time.  Psychiatric:        Mood and Affect: Mood normal.        Behavior: Behavior normal.        Thought Content: Thought content normal.        Judgment: Judgment normal.       Assessment & Plan:   Problem List Items Addressed This Visit   None Visit Diagnoses       Acute vaginitis    -  Primary   Check swab for BV and yeast. With recent antibiotics, will start diflucan  150mg  today and a second tablet in 1 week if needed for ongoing symptoms.   Relevant Medications   fluconazole  (DIFLUCAN ) 150 MG tablet   Other Relevant Orders   Cervicovaginal ancillary only     Acute bacterial sinusitis       Sinusitis with chest symptoms is improving but persistent, without dyspnea. Use albuterol  inhaler as needed. Consider prednisone  if symptoms worsen.   Relevant Medications   fluconazole  (DIFLUCAN ) 150 MG tablet  Meds ordered this encounter  Medications   albuterol  (VENTOLIN  HFA) 108 (90 Base) MCG/ACT inhaler    Sig: Inhale 2 puffs into the lungs every 6 (six) hours as needed for wheezing or shortness of breath.    Dispense:  8 g    Refill:  0   fluconazole  (DIFLUCAN ) 150 MG tablet    Sig: Take 1 tab by mouth x 1 dose today, repeat in one week.    Dispense:  2 tablet    Refill:  0   esomeprazole  (NEXIUM ) 40 MG capsule    Sig: Take 1 capsule (40 mg total) by mouth daily.    Dispense:  90 capsule    Refill:  1     No follow-ups on file.  Odette Benjamin, NP

## 2023-10-07 NOTE — Patient Instructions (Signed)
 It was great to see you!  Take 1 diflucan  tablet today and a second in 1 week if still having ongoing symptoms   I will let you know the results of your swab  Let's follow-up if symptoms worsen or any concerns   Take care,  Rheba Cedar, NP

## 2023-10-08 ENCOUNTER — Encounter: Payer: Self-pay | Admitting: Nurse Practitioner

## 2023-10-09 LAB — CERVICOVAGINAL ANCILLARY ONLY
Bacterial Vaginitis (gardnerella): POSITIVE — AB
Candida Glabrata: NEGATIVE
Candida Vaginitis: POSITIVE — AB
Chlamydia: NEGATIVE
Comment: NEGATIVE
Comment: NEGATIVE
Comment: NEGATIVE
Comment: NEGATIVE
Comment: NEGATIVE
Comment: NORMAL
Neisseria Gonorrhea: NEGATIVE
Trichomonas: NEGATIVE

## 2023-10-09 MED ORDER — PREDNISONE 20 MG PO TABS: 40.0000 mg | ORAL_TABLET | Freq: Every day | ORAL | 0 refills | Status: DC

## 2023-10-10 ENCOUNTER — Other Ambulatory Visit (HOSPITAL_COMMUNITY): Payer: Self-pay

## 2023-10-10 ENCOUNTER — Ambulatory Visit: Payer: Self-pay | Admitting: Nurse Practitioner

## 2023-10-10 DIAGNOSIS — N760 Acute vaginitis: Secondary | ICD-10-CM

## 2023-10-10 MED ORDER — FLUCONAZOLE 150 MG PO TABS
150.0000 mg | ORAL_TABLET | Freq: Once | ORAL | 0 refills | Status: AC
Start: 2023-10-10 — End: 2023-10-10

## 2023-10-10 MED ORDER — METRONIDAZOLE 500 MG PO TABS: 500.0000 mg | ORAL_TABLET | Freq: Two times a day (BID) | ORAL | 0 refills | Status: AC

## 2023-10-15 ENCOUNTER — Encounter (INDEPENDENT_AMBULATORY_CARE_PROVIDER_SITE_OTHER): Payer: Self-pay | Admitting: Nurse Practitioner

## 2023-10-15 DIAGNOSIS — B9689 Other specified bacterial agents as the cause of diseases classified elsewhere: Secondary | ICD-10-CM

## 2023-10-16 ENCOUNTER — Other Ambulatory Visit (HOSPITAL_COMMUNITY): Payer: Self-pay

## 2023-10-16 DIAGNOSIS — B9689 Other specified bacterial agents as the cause of diseases classified elsewhere: Secondary | ICD-10-CM | POA: Diagnosis not present

## 2023-10-16 DIAGNOSIS — J019 Acute sinusitis, unspecified: Secondary | ICD-10-CM | POA: Diagnosis not present

## 2023-10-16 MED ORDER — AMOXICILLIN-POT CLAVULANATE 875-125 MG PO TABS: 1.0000 | ORAL_TABLET | Freq: Two times a day (BID) | ORAL | 0 refills | Status: DC

## 2023-10-16 MED ORDER — FLUCONAZOLE 150 MG PO TABS: 150.0000 mg | ORAL_TABLET | Freq: Once | ORAL | 0 refills | Status: AC

## 2023-10-16 MED ORDER — DOXYCYCLINE HYCLATE 100 MG PO TABS
100.0000 mg | ORAL_TABLET | Freq: Two times a day (BID) | ORAL | 0 refills | Status: DC
  Filled 2023-10-16: qty 20, 10d supply, fill #0

## 2023-10-16 NOTE — Addendum Note (Signed)
 Addended by: Errika Narvaiz A on: 10/16/2023 11:57 AM   Modules accepted: Orders

## 2023-10-16 NOTE — Telephone Encounter (Signed)

## 2023-10-28 ENCOUNTER — Other Ambulatory Visit (HOSPITAL_COMMUNITY): Payer: Self-pay

## 2023-10-28 MED ORDER — FLUCONAZOLE 150 MG PO TABS
150.0000 mg | ORAL_TABLET | ORAL | 0 refills | Status: DC
  Filled 2023-10-28: qty 2, 6d supply, fill #0

## 2023-10-28 NOTE — Addendum Note (Signed)
 Addended by: Mizuki Hoel A on: 10/28/2023 08:21 AM   Modules accepted: Orders

## 2023-11-25 ENCOUNTER — Telehealth

## 2023-11-25 DIAGNOSIS — B372 Candidiasis of skin and nail: Secondary | ICD-10-CM

## 2023-11-26 ENCOUNTER — Other Ambulatory Visit: Payer: Self-pay

## 2023-11-26 ENCOUNTER — Other Ambulatory Visit (HOSPITAL_COMMUNITY): Payer: Self-pay

## 2023-11-26 MED ORDER — NYSTATIN 100000 UNIT/GM EX CREA
1.0000 | TOPICAL_CREAM | Freq: Two times a day (BID) | CUTANEOUS | 0 refills | Status: AC
  Filled 2023-11-26: qty 30, 15d supply, fill #0

## 2023-11-26 NOTE — Progress Notes (Signed)
 E Visit for Rash  We are sorry that you are not feeling well. Here is how we plan to help!  Based upon your presentation it appears you have a yeast infection.  I have prescribed: and Nystatin  cream apply to the affected area twice daily.   HOME CARE:  Take cool showers and avoid direct sunlight. Apply cool compress or wet dressings. Take a bath in an oatmeal bath.  Sprinkle content of one Aveeno packet under running faucet with comfortably warm water.  Bathe for 15-20 minutes, 1-2 times daily.  Pat dry with a towel. Do not rub the rash. Use hydrocortisone cream. Take an antihistamine like Benadryl for widespread rashes that itch.  The adult dose of Benadryl is 25-50 mg by mouth 4 times daily. Caution:  This type of medication may cause sleepiness.  Do not drink alcohol, drive, or operate dangerous machinery while taking antihistamines.  Do not take these medications if you have prostate enlargement.  Read package instructions thoroughly on all medications that you take.  GET HELP RIGHT AWAY IF:  Symptoms don't go away after treatment. Severe itching that persists. If you rash spreads or swells. If you rash begins to smell. If it blisters and opens or develops a yellow-brown crust. You develop a fever. You have a sore throat. You become short of breath.  MAKE SURE YOU:  Understand these instructions. Will watch your condition. Will get help right away if you are not doing well or get worse.  Thank you for choosing an e-visit.  Your e-visit answers were reviewed by a board certified advanced clinical practitioner to complete your personal care plan. Depending upon the condition, your plan could have included both over the counter or prescription medications.  Please review your pharmacy choice. Make sure the pharmacy is open so you can pick up prescription now. If there is a problem, you may contact your provider through [employer] and have the prescription routed to  another pharmacy.  Your safety is important to us . If you have drug allergies check your prescription carefully.   For the next 24 hours you can use MyChart to ask questions about today's visit, request a non-urgent call back, or ask for a work or school excuse. You will get an email in the next two days asking about your experience. I hope that your e-visit has been valuable and will speed your recovery.

## 2023-11-26 NOTE — Progress Notes (Signed)
 I have spent 5 minutes in review of e-visit questionnaire, review and updating patient chart, medical decision making and response to patient.   Piedad Climes, PA-C

## 2023-12-05 ENCOUNTER — Other Ambulatory Visit (HOSPITAL_COMMUNITY): Payer: Self-pay

## 2023-12-05 ENCOUNTER — Encounter: Payer: Self-pay | Admitting: Nurse Practitioner

## 2023-12-05 ENCOUNTER — Telehealth (HOSPITAL_COMMUNITY): Payer: Self-pay

## 2023-12-05 ENCOUNTER — Ambulatory Visit: Admitting: Nurse Practitioner

## 2023-12-05 VITALS — BP 122/86 | HR 77 | Temp 97.2°F | Ht 68.0 in | Wt 364.2 lb

## 2023-12-05 DIAGNOSIS — M25561 Pain in right knee: Secondary | ICD-10-CM

## 2023-12-05 DIAGNOSIS — F411 Generalized anxiety disorder: Secondary | ICD-10-CM

## 2023-12-05 DIAGNOSIS — G8929 Other chronic pain: Secondary | ICD-10-CM | POA: Insufficient documentation

## 2023-12-05 MED ORDER — WEGOVY 0.25 MG/0.5ML ~~LOC~~ SOAJ
0.2500 mg | SUBCUTANEOUS | 0 refills | Status: DC
  Filled 2023-12-05: qty 2, 28d supply, fill #0

## 2023-12-05 NOTE — Assessment & Plan Note (Signed)
 Chronic, stable. Continue Pristiq  50mg  daily and propranolol  10-20mg  BID prn. Continue collaboration and recommendations from psychiatry.

## 2023-12-05 NOTE — Progress Notes (Signed)
 Established Patient Office Visit  Subjective   Patient ID: Mary Brandt, female    DOB: [birth year]  Age: 38 y.o. MRN: [id number]  Chief Complaint  Patient presents with   Knee Pain    Right knee pain for 6 months, discuss weight management    HPI Discussed the use of AI scribe software for clinical note transcription with the patient, who gave verbal consent to proceed.  History of Present Illness   Mary Brandt is a 38 year old female who presents with concerns about weight gain and knee pain.  She has been taking Pristiq  for two months and propranolol  as needed for anxiety. Over the past month, she has experienced increased hunger and weight gain, which she attributes to propranolol . She maintains a healthy diet but has been eating more at meals and snacking more frequently. She has not been regularly exercising since the summer. She has bariatric surgery in 2020 and is interested in starting a GLP-1.   She has experienced right knee pain for six months, worsening with prolonged sitting or certain positions. The pain is located under the kneecap, sometimes causing unsteadiness. She recalls a fall years ago that may have contributed. There is no swelling, and she has not used medications like Tylenol  or topical treatments. Increased physical activity seems to alleviate the pain, but she has not been consistently active due to concerns about exacerbating the pain.        12/05/2023   11:49 AM 09/27/2022   10:04 AM 04/10/2022    8:58 AM 09/18/2021    1:23 PM 06/21/2021    3:10 PM  Depression screen PHQ 2/9  Decreased Interest 0 0 0 0 0  Down, Depressed, Hopeless 1 0 0 1 0  PHQ - 2 Score 1 0 0 1 0  Altered sleeping 0 0 0 1 0  Tired, decreased energy 0 0 0 0 0  Change in appetite 1 0 0 0 0  Feeling bad or failure about yourself  0 0 0 1 0  Trouble concentrating 0 0 0 0 0  Moving slowly or fidgety/restless 0 0 0 0 0  Suicidal thoughts 0 0 0 0 0  PHQ-9 Score 2 0 0 3 0   Difficult doing work/chores Not difficult at all   Not difficult at all       12/05/2023   11:49 AM 09/27/2022   10:05 AM 04/10/2022    8:58 AM 09/18/2021    1:23 PM  GAD 7 : Generalized Anxiety Score  Nervous, Anxious, on Edge 1 1 1 1   Control/stop worrying 1 0 1 1  Worry too much - different things 1 1 1 1   Trouble relaxing 0 1 1 0  Restless 0 0 0 0  Easily annoyed or irritable 1 0 2 1  Afraid - awful might happen 1 1 1  0  Total GAD 7 Score 5 4 7 4   Anxiety Difficulty Somewhat difficult Not difficult at all Not difficult at all Not difficult at all     ROS See pertinent positives and negatives per HPI.    Objective:     BP 122/86 (BP Location: Left Arm, Patient Position: Sitting, Cuff Size: Large)   Pulse 77   Temp (!) 97.2 F (36.2 C)   Ht 5' 8 (1.727 m)   Wt (!) 364 lb 3.2 oz (165.2 kg)   LMP 11/29/2023 (Exact Date)   SpO2 97%   BMI 55.38 kg/m  BP Readings from Last  3 Encounters:  12/05/23 122/86  10/07/23 118/80  07/12/23 103/71   Wt Readings from Last 3 Encounters:  12/05/23 (!) 364 lb 3.2 oz (165.2 kg)  10/07/23 (!) 360 lb (163.3 kg)  07/12/23 (!) 350 lb 12.8 oz (159.1 kg)      Physical Exam Vitals and nursing note reviewed.  Constitutional:      General: She is not in acute distress.    Appearance: Normal appearance.  HENT:     Head: Normocephalic.  Eyes:     Conjunctiva/sclera: Conjunctivae normal.  Cardiovascular:     Rate and Rhythm: Normal rate and regular rhythm.     Pulses: Normal pulses.     Heart sounds: Normal heart sounds.  Pulmonary:     Effort: Pulmonary effort is normal.     Breath sounds: Normal breath sounds.  Musculoskeletal:        General: No swelling or tenderness. Normal range of motion.     Cervical back: Normal range of motion.  Skin:    General: Skin is warm.  Neurological:     General: No focal deficit present.     Mental Status: She is alert and oriented to person, place, and time.  Psychiatric:        Mood and  Affect: Mood normal.        Behavior: Behavior normal.        Thought Content: Thought content normal.        Judgment: Judgment normal.      Assessment & Plan:   Problem List Items Addressed This Visit       Other   Morbid obesity (HCC)   BMI 55.3. Her weight gain may be related to propranolol  use. She had bariatric surgery in 2020 and has lost weight, but has plateuaed and started to gain weight. She is interested in GLP-1. Will start wegovy  0.25mg  injection weekly. Discussed possible side effects. Encourage her to use the plate method of eating with smaller portions. Exercise as able with knee pain.       Relevant Medications   Semaglutide -Weight Management (WEGOVY ) 0.25 MG/0.5ML SOAJ   Generalized anxiety disorder   Chronic, stable. Continue Pristiq  50mg  daily and propranolol  10-20mg  BID prn. Continue collaboration and recommendations from psychiatry.       Chronic pain of right knee - Primary   She experiences chronic pain under the kneecap, possibly due to arthritis. An x-ray of the right knee will be ordered at the [place] office. Referral to physical therapy is made for pain management. She can take tylenol  or use voltaren  gel as needed for pain.       Relevant Orders   DG Knee Complete 4 Views Right   Ambulatory referral to Physical Therapy     Return in about 4 weeks (around 01/02/2024) for 4-6 weeks weight management, knee pain.    [name], NP

## 2023-12-05 NOTE — Telephone Encounter (Signed)
 Pharmacy Patient Advocate Encounter   Received notification from Pt Calls Messages that prior authorization for Wegovy  0.25 mg/0.5 ml pen is required/requested.   Insurance verification completed.   The patient is insured through [employer] .   Per test claim: Product/service not covered

## 2023-12-05 NOTE — Patient Instructions (Addendum)
 It was great to see you!  Let's start wegovy  once a week to help with weight loss  Make sure you are drinking plenty of water  I have placed a referral to PT  Let's get an xray of your knee  Let's follow-up in 4-6 weeks, sooner if you have concerns.  If a referral was placed today, you will be contacted for an appointment. Please note that routine referrals can sometimes take up to 3-4 weeks to process. Please call our office if you haven't heard anything after this time frame.  Take care,  Tinnie Harada, NP

## 2023-12-05 NOTE — Telephone Encounter (Signed)
 PA request has been Received. New Encounter has been or will be created for follow up. For additional info see Pharmacy Prior Auth telephone encounter from 12/05/23.

## 2023-12-05 NOTE — Assessment & Plan Note (Signed)
 She experiences chronic pain under the kneecap, possibly due to arthritis. An x-ray of the right knee will be ordered at the [place] office. Referral to physical therapy is made for pain management. She can take tylenol  or use voltaren  gel as needed for pain.

## 2023-12-05 NOTE — Assessment & Plan Note (Signed)
 BMI 55.3. Her weight gain may be related to propranolol  use. She had bariatric surgery in 2020 and has lost weight, but has plateuaed and started to gain weight. She is interested in GLP-1. Will start wegovy  0.25mg  injection weekly. Discussed possible side effects. Encourage her to use the plate method of eating with smaller portions. Exercise as able with knee pain.

## 2023-12-18 ENCOUNTER — Ambulatory Visit: Admitting: Nurse Practitioner

## 2023-12-26 ENCOUNTER — Other Ambulatory Visit (HOSPITAL_COMMUNITY): Payer: Self-pay

## 2023-12-26 MED ORDER — DESVENLAFAXINE SUCCINATE ER 50 MG PO TB24
50.0000 mg | ORAL_TABLET | Freq: Every day | ORAL | 1 refills | Status: DC
  Filled 2023-12-26 – 2024-01-03 (×2): qty 90, 90d supply, fill #0

## 2023-12-27 ENCOUNTER — Other Ambulatory Visit (HOSPITAL_COMMUNITY): Payer: Self-pay

## 2023-12-30 ENCOUNTER — Ambulatory Visit: Admitting: Nurse Practitioner

## 2024-01-03 ENCOUNTER — Other Ambulatory Visit (HOSPITAL_COMMUNITY): Payer: Self-pay

## 2024-01-10 ENCOUNTER — Encounter: Payer: Self-pay | Admitting: Nurse Practitioner

## 2024-01-12 ENCOUNTER — Other Ambulatory Visit (HOSPITAL_COMMUNITY): Payer: Self-pay

## 2024-01-15 ENCOUNTER — Ambulatory Visit: Payer: Self-pay

## 2024-01-15 NOTE — Telephone Encounter (Signed)
 FYI Only or Action Required?: FYI only for provider.  Patient was last seen in primary care on 12/05/2023 by Nedra Tinnie LABOR, NP.  Called Nurse Triage reporting Leg Injury.  Symptoms began several weeks ago.  Interventions attempted: Nothing.  Symptoms are: stable.  Triage Disposition: See PCP When Office is Open (Within 3 Days)  Patient/caregiver understands and will follow disposition?: Yes Reason for Disposition  [1] After 3 days AND [2] pain not improved  Answer Assessment - Initial Assessment Questions Not necessarily hot to touch but feels a burning sensation inside. Experiences some numbness to that leg in general, denies new feeling of numbness. Patient has appointment scheduled for 9/12  1. MECHANISM: How did the injury happen? (e.g., twisting injury, direct blow)      2 years ago fell on porch, landed on metal shelf, dent in shin from that. Now experiencing a burning sensation inside that comes and goes  2. ONSET: When did the burning sensation happen? (e.g., minutes, hours ago)      2 weeks  3. LOCATION: Where is the injury/ burning sensation located?      Right shin, denies radiating anywhere else.  4. PAIN: Is there pain? If Yes, ask: How bad is the pain?   What does it keep you from doing? (Scale 0-10; or none, mild, moderate, severe)     No pain, just a burning sensation that comes and goes  5. OTHER SYMPTOMS: Do you have any other symptoms?      Red/discolored since injury, denies any new signs of discoloration  Protocols used: Leg Injury-A-AH  Copied from CRM #[id number]. Topic: Clinical - Red Word Triage >> Jan 15, 2024  9:57 AM [place] A wrote: [facility] that prompted transfer to Nurse Triage: Patient is having right shin area is hot to touch. Patient injured leg over a year ago and now it is bothering her. CAL wants patient to speak to NT.

## 2024-01-17 ENCOUNTER — Ambulatory Visit: Admitting: Nurse Practitioner

## 2024-01-17 ENCOUNTER — Encounter: Payer: Self-pay | Admitting: Nurse Practitioner

## 2024-01-17 ENCOUNTER — Ambulatory Visit

## 2024-01-17 VITALS — BP 118/70 | HR 81 | Temp 97.6°F | Ht 68.0 in | Wt 368.8 lb

## 2024-01-17 DIAGNOSIS — M79604 Pain in right leg: Secondary | ICD-10-CM | POA: Insufficient documentation

## 2024-01-17 DIAGNOSIS — G8929 Other chronic pain: Secondary | ICD-10-CM

## 2024-01-17 DIAGNOSIS — M25561 Pain in right knee: Secondary | ICD-10-CM

## 2024-01-17 NOTE — Patient Instructions (Signed)
 It was great to see you!  We are getting xrays of your knee and leg   Start the attached exercises daily   Let's follow-up in 2-3 months, sooner if you have concerns.  If a referral was placed today, you will be contacted for an appointment. Please note that routine referrals can sometimes take up to 3-4 weeks to process. Please call our office if you haven't heard anything after this time frame.  Take care,  Tinnie Harada, NP

## 2024-01-17 NOTE — Assessment & Plan Note (Signed)
 She experiences chronic numbness and burning in the right leg following an injury, possibly due to post-traumatic changes, scar tissue, or neuropathic pain related to lumbar spine issues. Order an x-ray of the right leg. Recommend daily stretching exercises for the back. Gabapentin  for nerve pain was discussed, but she declined.

## 2024-01-17 NOTE — Assessment & Plan Note (Signed)
 BMI 56. 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 Continue the personal training program.

## 2024-01-17 NOTE — Progress Notes (Signed)
 Acute Office Visit  Subjective:     Patient ID: Mary Brandt, female    DOB: 07-11-1985, 38 y.o.   MRN: [id number]  Chief Complaint  Patient presents with   Burning Sensation    Right lower leg burning sensation for 1 week, no swelling    HPI Discussed the use of AI scribe software for clinical note transcription with the patient, who gave verbal consent to proceed.  History of Present Illness   [name] is a 38 year old female who presents with a burning sensation in her right leg.  The burning sensation is localized to the area under a previous injury from two years ago, where she fell onto a metal object. The sensation is noticeable but not painful, and the area is not hot to the touch. There is no radiation of the sensation. She has not applied any treatments or medications to the area. There is no rash noted to the area.   Since the injury, she has observed changes in hair growth and discoloration in the affected area, along with constant numbness. The area feels more firm compared to the surrounding tissue. There is no swelling compared to the other leg.  She has not undergone any diagnostic imaging since the injury. At the time of the injury, she received antibiotics for a suspected infection but did not seek further treatment.  She experiences chronic knee pain in the same leg and has not had an x-ray for this issue. She also has a history of back problems, though she is not currently experiencing any back pain.   She has recently resumed personal training after weight loss surgery, participating in a program with student trainers.     ROS See pertinent positives and negatives per HPI.     Objective:    BP 118/70 (BP Location: Left Arm, Patient Position: Sitting, Cuff Size: Large)   Pulse 81   Temp 97.6 F (36.4 C)   Ht 5' 8 (1.727 m)   Wt (!) 368 lb 12.8 oz (167.3 kg)   SpO2 97%   BMI 56.08 kg/m  BP Readings from Last 3 Encounters:  01/17/24 118/70   12/05/23 122/86  10/07/23 118/80   Wt Readings from Last 3 Encounters:  01/17/24 (!) 368 lb 12.8 oz (167.3 kg)  12/05/23 (!) 364 lb 3.2 oz (165.2 kg)  10/07/23 (!) 360 lb (163.3 kg)      Physical Exam Vitals and nursing note reviewed.  Constitutional:      General: She is not in acute distress.    Appearance: Normal appearance.  HENT:     Head: Normocephalic.  Eyes:     Conjunctiva/sclera: Conjunctivae normal.  Pulmonary:     Effort: Pulmonary effort is normal.  Musculoskeletal:        General: Tenderness (right anterior shin) present.     Cervical back: Normal range of motion.  Skin:    General: Skin is warm.     Findings: No rash.     Comments: Firmness noted on right shin where prior injury occurred  Neurological:     General: No focal deficit present.     Mental Status: She is alert and oriented to person, place, and time.  Psychiatric:        Mood and Affect: Mood normal.        Behavior: Behavior normal.        Thought Content: Thought content normal.        Judgment: Judgment normal.  Assessment & Plan:   Problem List Items Addressed This Visit       Other   Morbid obesity (HCC)   BMI 56. 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 Continue the personal training program.      Chronic pain of right knee   She experiences chronic pain under the kneecap, possibly due to arthritis. Order an x-ray of the right knee. She can take tylenol  or use voltaren  gel as needed for pain.       Relevant Orders   DG Knee Complete 4 Views Right   Right leg pain - Primary   She experiences chronic numbness and burning in the right leg following an injury, possibly due to post-traumatic changes, scar tissue, or neuropathic pain related to lumbar spine issues. Order an x-ray of the right leg. Recommend daily stretching exercises for the back. Gabapentin  for nerve pain was discussed, but she declined.       Relevant Orders   DG Tibia/Fibula Right   No orders of the defined types were placed in this encounter.   Return in about 3 months (around 04/17/2024) for CPE.  [name], NP

## 2024-01-17 NOTE — Assessment & Plan Note (Signed)
 She experiences chronic pain under the kneecap, possibly due to arthritis. Order an x-ray of the right knee. She can take tylenol  or use voltaren  gel as needed for pain.

## 2024-01-27 ENCOUNTER — Ambulatory Visit: Payer: Self-pay | Admitting: Nurse Practitioner

## 2024-01-29 ENCOUNTER — Ambulatory Visit: Admitting: Nurse Practitioner

## 2024-02-07 ENCOUNTER — Other Ambulatory Visit (HOSPITAL_COMMUNITY): Payer: Self-pay

## 2024-02-10 ENCOUNTER — Encounter: Payer: Self-pay | Admitting: Nurse Practitioner

## 2024-02-10 DIAGNOSIS — M79604 Pain in right leg: Secondary | ICD-10-CM

## 2024-02-17 ENCOUNTER — Ambulatory Visit: Admitting: Orthopedic Surgery

## 2024-02-17 DIAGNOSIS — G8929 Other chronic pain: Secondary | ICD-10-CM

## 2024-02-17 DIAGNOSIS — M25571 Pain in right ankle and joints of right foot: Secondary | ICD-10-CM

## 2024-02-17 DIAGNOSIS — M25561 Pain in right knee: Secondary | ICD-10-CM

## 2024-02-18 ENCOUNTER — Encounter: Payer: Self-pay | Admitting: Orthopedic Surgery

## 2024-02-18 NOTE — Progress Notes (Signed)
 Office Visit Note   Patient: Mary Brandt           Date of Birth: 12-Jan-1986           MRN: [id number] Visit Date: 02/17/2024              Requested by: Nedra Tinnie LABOR, NP [address] [place],  [place] [address] PCP: Nedra Tinnie LABOR, NP  Chief Complaint  Patient presents with   Right Leg - Pain      HPI: Discussed the use of AI scribe software for clinical note transcription with the patient, who gave verbal consent to proceed.  History of Present Illness Mary Brandt is a 38 year old female who presents with right knee pain.  She experiences intermittent right knee pain with variable intensity, describing it as 'coming and going' with some days being more pronounced. She has not received any injections in the knee previously and feels she can manage the pain most days without intervention.  She has a history of an avulsion injury to the lateral fibula of the right ankle, which occurred sometime between 2015 and 2025. Despite this, she reports no significant pain or discomfort in the ankle, only occasional weakness. No pain while walking or moving the ankle, and she has not used an ankle brace, as she feels it is unnecessary.  She has not tried any specific medications for her knee pain but is aware of over-the-counter options like Voltaren  gel. She has not found supplements to be effective for her arthritis in the past.     Assessment & Plan: Visit Diagnoses: No diagnosis found.  Plan: Assessment and Plan Assessment & Plan Right knee osteoarthritis Chronic osteoarthritis with mild medial joint line narrowing and crepitation. Pain varies. No prior injections. Prefers conservative management. Insurance requires BMI <40 and A1c <7.4 for knee replacement. - Recommend Voltaren  gel 3-4 times daily. - Advise strengthening exercises and weight reduction. - Discuss potential for cortisone or hyaluronic acid injections if needed.  Old right lateral fibular  avulsion injury Old avulsion injury with no current symptoms or instability. Good subtalar motion and negative anterior drawer test. - Advise isometric toe raises and toe gripping exercises.      Follow-Up Instructions: Return if symptoms worsen or fail to improve.   Ortho Exam  Patient is alert, oriented, no adenopathy, well-dressed, normal affect, normal respiratory effort. Physical Exam EXTREMITIES: Right ankle with good pulse. MUSCULOSKELETAL: Right ankle with good subtalar motion and negative anterior drawer. Right knee with crepitus on range of motion, tenderness at medial lateral joint line, collaterals and cruciates stable, no effusion.      Imaging: No results found. No images are attached to the encounter.  Labs: Lab Results  Component Value Date   HGBA1C 5.9 03/21/2023   HGBA1C 6.1 09/27/2022   HGBA1C 6.1 09/18/2021   LABORGA NO GROWTH 03/22/2016     Lab Results  Component Value Date   ALBUMIN 4.1 03/21/2023   ALBUMIN 3.9 09/27/2022   ALBUMIN 4.1 09/18/2021    Lab Results  Component Value Date   MG 2.3 01/06/2019   Lab Results  Component Value Date   VD25OH 32.54 03/21/2023   VD25OH 22.01 (L) 09/27/2022   VD25OH 32.02 09/19/2020    No results found for: PREALBUMIN    Latest Ref Rng & Units 03/21/2023    4:23 PM 09/27/2022   10:52 AM 09/18/2021    1:42 PM  CBC EXTENDED  WBC 4.0 - 10.5 K/uL  12.2  9.5  11.8   RBC 3.87 - 5.11 Mil/uL 4.92  4.84  4.81   Hemoglobin 12.0 - 15.0 g/dL 85.8  86.4  88.3   HCT 36.0 - 46.0 % 44.6  42.5  37.8   Platelets 150.0 - 400.0 K/uL 382.0  364.0  387.0   NEUT# 1.4 - 7.7 K/uL 9.1   8.1   Lymph# 0.7 - 4.0 K/uL 1.7   2.6      There is no height or weight on file to calculate BMI.  Orders:  No orders of the defined types were placed in this encounter.  No orders of the defined types were placed in this encounter.    Procedures: No procedures performed  Clinical Data: No additional findings.  ROS:  All  other systems negative, except as noted in the HPI. Review of Systems  Objective: Vital Signs: There were no vitals taken for this visit.  Specialty Comments:  No specialty comments available.  PMFS History: Patient Active Problem List   Diagnosis Date Noted   Right leg pain 01/17/2024   Chronic pain of right knee 12/05/2023   Vitamin D  insufficiency 03/21/2023   Iron deficiency anemia 09/27/2022   Routine general medical examination at a health care facility 09/27/2022   History of bariatric surgery 09/27/2022   Prediabetes 04/17/2021   Nutritional counseling 03/14/2018   OSA (obstructive sleep apnea) 03/22/2016   Low serum iron 12/01/2015   Morbid obesity (HCC) 01/26/2014   GERD (gastroesophageal reflux disease) 01/26/2014   Generalized anxiety disorder 01/26/2014   Past Medical History:  Diagnosis Date   Anxiety 2012   Asthma    GERD (gastroesophageal reflux disease)    Worsening in 2024   Hyperglycemia    Hyperlipemia    Morbid obesity (HCC)    sees weight loss clinic   [place]    sees dermatologist, Dr. Helga   Obstructive sleep apnea on CPAP    Sleep apnea     Family History  Problem Relation Age of Onset   Atrial fibrillation Mother    Alcohol abuse Mother    Varicose Veins Mother    Diabetes Father    Cancer Father        skin   CVA Father    Obesity Father    Stroke Father    Polycystic ovary syndrome Sister    Depression Sister    Anxiety disorder Sister    Bipolar disorder Sister    ADD / ADHD Sister    Obesity Sister    Dementia Maternal Grandmother    Early death Maternal Grandmother    Cancer Maternal Grandfather        skin    Past Surgical History:  Procedure Laterality Date   finger laceration Right 2008   2nd finger   implanon insertion  06-26-11   left upper arm   LAPAROSCOPIC GASTRIC SLEEVE RESECTION N/A 01/05/2019   Procedure: LAPAROSCOPIC GASTRIC SLEEVE RESECTION, UPPER ENDO, ERAS Pathway;  Surgeon: Signe Mitzie LABOR, MD;   Location: [facility];  Service: General;  Laterality: N/A;   MOLE REMOVAL     OTHER SURGICAL HISTORY Left [id number]    Implanon   WISDOM TOOTH EXTRACTION     Social History   Occupational History   Occupation: LEGAL ASSISTANT    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

## 2024-02-19 ENCOUNTER — Encounter: Payer: Self-pay | Admitting: Nurse Practitioner

## 2024-02-19 ENCOUNTER — Other Ambulatory Visit (HOSPITAL_COMMUNITY): Payer: Self-pay

## 2024-02-19 ENCOUNTER — Ambulatory Visit: Admitting: Nurse Practitioner

## 2024-02-19 ENCOUNTER — Other Ambulatory Visit (HOSPITAL_COMMUNITY)
Admission: RE | Admit: 2024-02-19 | Discharge: 2024-02-19 | Disposition: A | Discharge status: Home / Self Care | Source: Ambulatory Visit | Attending: Nurse Practitioner | Admitting: Nurse Practitioner

## 2024-02-19 VITALS — BP 112/74 | HR 75 | Temp 97.3°F | Ht 68.0 in | Wt 369.0 lb

## 2024-02-19 DIAGNOSIS — N3001 Acute cystitis with hematuria: Secondary | ICD-10-CM

## 2024-02-19 DIAGNOSIS — R300 Dysuria: Secondary | ICD-10-CM | POA: Diagnosis not present

## 2024-02-19 DIAGNOSIS — N898 Other specified noninflammatory disorders of vagina: Secondary | ICD-10-CM | POA: Insufficient documentation

## 2024-02-19 LAB — POC URINALSYSI DIPSTICK (AUTOMATED)
Bilirubin, UA: NEGATIVE
Blood, UA: POSITIVE
Glucose, UA: NEGATIVE
Ketones, UA: NEGATIVE
Nitrite, UA: NEGATIVE
Protein, UA: POSITIVE — AB
Spec Grav, UA: 1.025 (ref 1.010–1.025)
Urobilinogen, UA: 0.2 U/dL
pH, UA: 6 (ref 5.0–8.0)

## 2024-02-19 MED ORDER — FLUCONAZOLE 150 MG PO TABS
150.0000 mg | ORAL_TABLET | Freq: Once | ORAL | 0 refills | Status: DC
  Filled 2024-02-19: qty 1, 1d supply, fill #0

## 2024-02-19 MED ORDER — NITROFURANTOIN MONOHYD MACRO 100 MG PO CAPS
100.0000 mg | ORAL_CAPSULE | Freq: Two times a day (BID) | ORAL | 0 refills | Status: DC
  Filled 2024-02-19: qty 10, 5d supply, fill #0

## 2024-02-19 NOTE — Patient Instructions (Signed)
 It was great to see you!  We are checking your labs today and will let you know the results via mychart/phone.   Start macrobid twice a day for 5 days  Take diflucan  after finishing macrobid  Drink plenty of water  Let's follow-up if symptoms worsen or don't improve  Take care,  Tinnie Harada, NP

## 2024-02-19 NOTE — Progress Notes (Signed)
 Acute Office Visit  Subjective:     Patient ID: Mary Brandt, female    DOB: 1985-08-01, 38 y.o.   MRN: [id number]  Chief Complaint  Patient presents with   Vaginal Issues    Vaginals discharge, burning with urination, vaginal odor    HPI Discussed the use of AI scribe software for clinical note transcription with the patient, who gave verbal consent to proceed.  History of Present Illness   Mary Brandt is a 38 year old female who presents with recurrent symptoms of bacterial vaginosis.  She experiences vaginal burning, discharge, and odor, which are intermittent. These symptoms were particularly bothersome last week, and she feels she is still present.  She has been treated for bacterial vaginosis twice before, including a treatment in June, using oral antibiotics.  She denies burning during urination, fever, stomach pain, or unusual back pain. She does have back pain, but it is not unusual for her.     ROS See pertinent positives and negatives per HPI.     Objective:    BP 112/74 (BP Location: Left Arm, Patient Position: Sitting, Cuff Size: Large)   Pulse 75   Temp (!) 97.3 F (36.3 C)   Ht 5' 8 (1.727 m)   Wt (!) 369 lb (167.4 kg)   LMP 02/10/2024 (Approximate)   SpO2 97%   BMI 56.11 kg/m    Physical Exam Vitals and nursing note reviewed.  Constitutional:      General: She is not in acute distress.    Appearance: Normal appearance.  HENT:     Head: Normocephalic.  Eyes:     Conjunctiva/sclera: Conjunctivae normal.  Pulmonary:     Effort: Pulmonary effort is normal.  Abdominal:     Palpations: Abdomen is soft.     Tenderness: There is no abdominal tenderness. There is no right CVA tenderness or left CVA tenderness.  Musculoskeletal:     Cervical back: Normal range of motion.  Skin:    General: Skin is warm.  Neurological:     General: No focal deficit present.     Mental Status: She is alert and oriented to person, place, and time.   Psychiatric:        Mood and Affect: Mood normal.        Behavior: Behavior normal.        Thought Content: Thought content normal.        Judgment: Judgment normal.     Results for orders placed or performed in visit on 02/19/24  POCT Urinalysis Dipstick (Automated)  Result Value Ref Range   Color, UA     Clarity, UA     Glucose, UA Negative Negative   Bilirubin, UA Negative    Ketones, UA Negative    Spec Grav, UA 1.025 1.010 - 1.025   Blood, UA Positive    pH, UA 6.0 5.0 - 8.0   Protein, UA Positive (A) Negative   Urobilinogen, UA 0.2 0.2 or 1.0 E.U./dL   Nitrite, UA Negative    Leukocytes, UA Large (3+) (A) Negative        Assessment & Plan:   Problem List Items Addressed This Visit   None Visit Diagnoses       Acute cystitis with hematuria    -  Primary   Start macrobid 100mg  BID x5 days. Encourage fluids. Diflucan  150mg  after finishing macrobid. Add urine culture   Relevant Orders   Urine Culture     Burning with urination  U/A positive blood 3 + leukocytes. Will treat for UTI. Encourage fluids.   Relevant Orders   POCT Urinalysis Dipstick (Automated) (Completed)     Vaginal discharge       Check vaginal swab for BV, yeast, trich. Treat based on results.   Relevant Orders   Cervicovaginal ancillary only       Meds ordered this encounter  Medications   nitrofurantoin, macrocrystal-monohydrate, (MACROBID) 100 MG capsule    Sig: Take 1 capsule (100 mg total) by mouth 2 (two) times daily.    Dispense:  10 capsule    Refill:  0   fluconazole  (DIFLUCAN ) 150 MG tablet    Sig: Take 1 tablet (150 mg total) by mouth once for 1 dose. Take after finishing macrobid    Dispense:  1 tablet    Refill:  0    Return if symptoms worsen or fail to improve.  [name], NP

## 2024-02-20 ENCOUNTER — Ambulatory Visit: Payer: Self-pay | Admitting: Nurse Practitioner

## 2024-02-20 ENCOUNTER — Other Ambulatory Visit (HOSPITAL_COMMUNITY): Payer: Self-pay

## 2024-02-20 ENCOUNTER — Telehealth: Admitting: Emergency Medicine

## 2024-02-20 DIAGNOSIS — K529 Noninfective gastroenteritis and colitis, unspecified: Secondary | ICD-10-CM

## 2024-02-20 LAB — CERVICOVAGINAL ANCILLARY ONLY
Bacterial Vaginitis (gardnerella): POSITIVE — AB
Candida Glabrata: NEGATIVE
Candida Vaginitis: POSITIVE — AB
Comment: NEGATIVE
Comment: NEGATIVE
Comment: NEGATIVE
Comment: NEGATIVE
Trichomonas: POSITIVE — AB

## 2024-02-20 LAB — URINE CULTURE
MICRO NUMBER:: 17102770
SPECIMEN QUALITY:: ADEQUATE

## 2024-02-20 MED ORDER — METRONIDAZOLE 500 MG PO TABS
500.0000 mg | ORAL_TABLET | Freq: Two times a day (BID) | ORAL | 0 refills | Status: AC
  Filled 2024-02-20: qty 14, 7d supply, fill #0

## 2024-02-20 MED ORDER — ONDANSETRON HCL 4 MG PO TABS: 4.0000 mg | ORAL_TABLET | Freq: Three times a day (TID) | ORAL | 0 refills | Status: DC | PRN

## 2024-02-20 NOTE — Patient Instructions (Signed)
 Mary Brandt, thank you for joining Jon CHRISTELLA Belt, NP for today's virtual visit.  While this provider is not your primary care provider (PCP), if your PCP is located in our provider database this encounter information will be shared with them immediately following your visit.   A [facility] MyChart account gives you access to today's visit and all your visits, tests, and labs performed at [facility]  click here if you don't have a [facility] MyChart account or go to [email or web address]  Consent: (Patient) Mary Brandt provided verbal consent for this virtual visit at the beginning of the encounter.  Current Medications:  Current Outpatient Medications:    ondansetron  (ZOFRAN ) 4 MG tablet, Take 1 tablet (4 mg total) by mouth every 8 (eight) hours as needed for nausea or vomiting., Disp: 20 tablet, Rfl: 0   albuterol  (VENTOLIN  HFA) 108 (90 Base) MCG/ACT inhaler, Inhale 2 puffs into the lungs every 6 (six) hours as needed for wheezing or shortness of breath., Disp: 8 g, Rfl: 0   desvenlafaxine  (PRISTIQ ) 50 MG 24 hr tablet, Take 1 tablet (50 mg total) by mouth daily., Disp: 90 tablet, Rfl: 1   esomeprazole  (NEXIUM ) 40 MG capsule, Take 1 capsule (40 mg total) by mouth daily., Disp: 90 capsule, Rfl: 1   fluconazole  (DIFLUCAN ) 150 MG tablet, Take 1 tablet (150 mg total) by mouth once for 1 dose. Take after finishing macrobid, Disp: 1 tablet, Rfl: 0   IRON-FOLIC ACID  PO, Take 60 mg by mouth., Disp: , Rfl:    levonorgestrel  (MIRENA ) 20 MCG/24HR IUD, 1 each by Intrauterine route once., Disp: , Rfl:    Multiple Vitamins-Minerals (MULTIVITAMIN ADULT PO), Take 1 tablet by mouth daily. , Disp: , Rfl:    nitrofurantoin, macrocrystal-monohydrate, (MACROBID) 100 MG capsule, Take 1 capsule (100 mg total) by mouth 2 (two) times daily., Disp: 10 capsule, Rfl: 0   nystatin  cream (MYCOSTATIN ), Apply topically 2 (two) times daily., Disp: 30 g, Rfl: 0   Medications ordered in this  encounter:  Meds ordered this encounter  Medications   ondansetron  (ZOFRAN ) 4 MG tablet    Sig: Take 1 tablet (4 mg total) by mouth every 8 (eight) hours as needed for nausea or vomiting.    Dispense:  20 tablet    Refill:  0     *If you need refills on other medications prior to your next appointment, please contact your pharmacy*  Follow-Up: Call back or seek an in-person evaluation if the symptoms worsen or if the condition fails to improve as anticipated.  [facility] [phone]  Other Instructions Please get and starting taking antibiotics for UTI. If diarrhea continues, try over the counter Imodium.   I have prescribed nausea medicine (ondanestron).   Start with sips of ginger ale, maybe a spoonful every 5 minutes, until you are sure you can keep more liquids down.   If you are feeling much worse, develop a fever, cannot stop vomiting despite medicine, are not able to stay hydrated, please be seen in person.    If you have been instructed to have an in-person evaluation today at a local Urgent Care facility, please use the link below. It will take you to a list of all of our available [facility], including address, phone number and hours of operation. Please do not delay care.  [facility]  If you or a family member do not have a primary care provider, use the link below  to schedule a visit and establish care. When you choose a [facility] primary care physician or advanced practice provider, you gain a long-term partner in health. Find a Primary Care Provider  Learn more about [facility]'s in-office and virtual care options:  - Get Care Now

## 2024-02-20 NOTE — Progress Notes (Signed)
 Virtual Visit Consent   Mary Brandt, you are scheduled for a virtual visit with a  provider today. Just as with appointments in the office, your consent must be obtained to participate. Your consent will be active for this visit and any virtual visit you may have with one of our providers in the next 365 days. If you have a MyChart account, a copy of this consent can be sent to you electronically.  As this is a virtual visit, video technology does not allow for your provider to perform a traditional examination. This may limit your provider's ability to fully assess your condition. If your provider identifies any concerns that need to be evaluated in person or the need to arrange testing (such as labs, EKG, etc.), we will make arrangements to do so. Although advances in technology are sophisticated, we cannot ensure that it will always work on either your end or our end. If the connection with a video visit is poor, the visit may have to be switched to a telephone visit. With either a video or telephone visit, we are not always able to ensure that we have a secure connection.  By engaging in this virtual visit, you consent to the provision of healthcare and authorize for your insurance to be billed (if applicable) for the services provided during this visit. Depending on your insurance coverage, you may receive a charge related to this service.  I need to obtain your verbal consent now. Are you willing to proceed with your visit today? Mary Brandt has provided verbal consent on 02/20/2024 for a virtual visit (video or telephone). Jon CHRISTELLA Belt, NP  Date: 02/20/2024 11:07 AM   Virtual Visit via Video Note   I, Jon CHRISTELLA Belt, connected with  Mary Brandt  ([id number], [birth year]) on 02/20/24 at 11:00 AM EDT by a video-enabled telemedicine application and verified that I am speaking with the correct person using two identifiers.  Location: Patient: Virtual Visit Location  Patient: Home Provider: Virtual Visit Location Provider: Home Office   I discussed the limitations of evaluation and management by telemedicine and the availability of in person appointments. The patient expressed understanding and agreed to proceed.    History of Present Illness: Mary Brandt is a 38 y.o. who identifies as a female who was assigned female at birth, and is being seen today for vomting and diarrhea.   Dx uti yesterday, hasn't started antibiotics yet. Has had sx for about a week.   Got a tattoo yesterday, had chills and felt poorly on way home from tattoo. Woke up 4am with diarrhea, had 5-6 episodes. Vomited x3. No abd pain except cramping before diarrhea. No blood or mucus in diarrhea. No blood in emesis. Chills today too but doesn't think she has a fever.   Has taken 2 chewable pepto bismol and thinks they are helping  No pain or concern in tattoo area.   Drinking water, has ginger ale and can start it.   HPI: HPI  Problems:  Patient Active Problem List   Diagnosis Date Noted   Right leg pain 01/17/2024   Chronic pain of right knee 12/05/2023   Vitamin D  insufficiency 03/21/2023   Iron deficiency anemia 09/27/2022   Routine general medical examination at a health care facility 09/27/2022   History of bariatric surgery 09/27/2022   Prediabetes 04/17/2021   Nutritional counseling 03/14/2018   OSA (obstructive sleep apnea) 03/22/2016   Low serum iron 12/01/2015   Morbid obesity (HCC)  01/26/2014   GERD (gastroesophageal reflux disease) 01/26/2014   Generalized anxiety disorder 01/26/2014    Allergies: No Known Allergies Medications:  Current Outpatient Medications:    ondansetron  (ZOFRAN ) 4 MG tablet, Take 1 tablet (4 mg total) by mouth every 8 (eight) hours as needed for nausea or vomiting., Disp: 20 tablet, Rfl: 0   albuterol  (VENTOLIN  HFA) 108 (90 Base) MCG/ACT inhaler, Inhale 2 puffs into the lungs every 6 (six) hours as needed for wheezing or shortness of  breath., Disp: 8 g, Rfl: 0   desvenlafaxine  (PRISTIQ ) 50 MG 24 hr tablet, Take 1 tablet (50 mg total) by mouth daily., Disp: 90 tablet, Rfl: 1   esomeprazole  (NEXIUM ) 40 MG capsule, Take 1 capsule (40 mg total) by mouth daily., Disp: 90 capsule, Rfl: 1   fluconazole  (DIFLUCAN ) 150 MG tablet, Take 1 tablet (150 mg total) by mouth once for 1 dose. Take after finishing macrobid, Disp: 1 tablet, Rfl: 0   IRON-FOLIC ACID  PO, Take 60 mg by mouth., Disp: , Rfl:    levonorgestrel  (MIRENA ) 20 MCG/24HR IUD, 1 each by Intrauterine route once., Disp: , Rfl:    Multiple Vitamins-Minerals (MULTIVITAMIN ADULT PO), Take 1 tablet by mouth daily. , Disp: , Rfl:    nitrofurantoin, macrocrystal-monohydrate, (MACROBID) 100 MG capsule, Take 1 capsule (100 mg total) by mouth 2 (two) times daily., Disp: 10 capsule, Rfl: 0   nystatin  cream (MYCOSTATIN ), Apply topically 2 (two) times daily., Disp: 30 g, Rfl: 0  Observations/Objective: Patient is well-developed, well-nourished in no acute distress.  Resting comfortably  at home.  Head is normocephalic, atraumatic.  No labored breathing.  Speech is clear and coherent with logical content.  Patient is alert and oriented at baseline.    Assessment and Plan: 1. Gastroenteritis (Primary)  I don't think her diarrhea/vomiting is related to UTI or tattoo. I do think timing of getting tattoo wasn't ideal since she was already sick with uti. Advised her to get anbitotics for uti and start them, rx zofran , discussed hydration strategies, try immodium if needed to stop diarrhea. Discussed reasons ofr seeking in person care.   Follow Up Instructions: I discussed the assessment and treatment plan with the patient. The patient was provided an opportunity to ask questions and all were answered. The patient agreed with the plan and demonstrated an understanding of the instructions.  A copy of instructions were sent to the patient via MyChart unless otherwise noted below.   The  patient was advised to call back or seek an in-person evaluation if the symptoms worsen or if the condition fails to improve as anticipated.    Jon CHRISTELLA Belt, NP

## 2024-02-21 ENCOUNTER — Other Ambulatory Visit (HOSPITAL_COMMUNITY): Payer: Self-pay

## 2024-02-21 MED ORDER — FLUCONAZOLE 150 MG PO TABS
150.0000 mg | ORAL_TABLET | Freq: Once | ORAL | 0 refills | Status: AC
  Filled 2024-02-21: qty 1, 1d supply, fill #0

## 2024-03-09 ENCOUNTER — Encounter: Payer: Self-pay | Admitting: Radiology

## 2024-03-25 ENCOUNTER — Encounter: Payer: Self-pay | Admitting: Nurse Practitioner

## 2024-03-25 ENCOUNTER — Other Ambulatory Visit (HOSPITAL_COMMUNITY)
Admission: RE | Admit: 2024-03-25 | Discharge: 2024-03-25 | Disposition: A | Source: Ambulatory Visit | Attending: Nurse Practitioner | Admitting: Nurse Practitioner

## 2024-03-25 ENCOUNTER — Other Ambulatory Visit (HOSPITAL_COMMUNITY): Payer: Self-pay

## 2024-03-25 ENCOUNTER — Ambulatory Visit: Admitting: Nurse Practitioner

## 2024-03-25 VITALS — BP 118/76 | HR 68 | Temp 97.5°F | Ht 68.0 in | Wt 368.8 lb

## 2024-03-25 DIAGNOSIS — N898 Other specified noninflammatory disorders of vagina: Secondary | ICD-10-CM

## 2024-03-25 LAB — POCT URINALYSIS DIPSTICK
Bilirubin, UA: NEGATIVE
Blood, UA: NEGATIVE
Glucose, UA: NEGATIVE
Ketones, UA: NEGATIVE
Nitrite, UA: NEGATIVE
Protein, UA: NEGATIVE
Spec Grav, UA: 1.015 (ref 1.010–1.025)
Urobilinogen, UA: 0.2 U/dL
pH, UA: 6 (ref 5.0–8.0)

## 2024-03-25 MED ORDER — FLUCONAZOLE 150 MG PO TABS
150.0000 mg | ORAL_TABLET | ORAL | 0 refills | Status: DC
  Filled 2024-03-25: qty 2, 14d supply, fill #0

## 2024-03-25 MED ORDER — METRONIDAZOLE 500 MG PO TABS
500.0000 mg | ORAL_TABLET | Freq: Two times a day (BID) | ORAL | 0 refills | Status: AC
  Filled 2024-03-25: qty 14, 7d supply, fill #0

## 2024-03-25 NOTE — Progress Notes (Signed)
 Acute Office Visit  Subjective:     Patient ID: Mary Brandt, female    DOB: [birth year], 38 y.o.   MRN: [id number]  Chief Complaint  Patient presents with   Vaginal Discharge    With itching and burning and cloudy urine    HPI Discussed the use of AI scribe software for clinical note transcription with the patient, who gave verbal consent to proceed.  History of Present Illness   Mary Brandt is a 38 year old female who presents with recurrent vaginal discharge and discomfort.  She experiences recurrent vaginal discharge and discomfort, similar to previous episodes. Symptoms resolved before starting previous antibiotic treatment. There is no burning sensation during urination, but general discomfort is present. Over the weekend, she experienced pelvic pain and cramping, initially thought to be menstrual. Persistent lower back pain is noted.  She is sexually active and underwent STD testing in June, which was negative. She experiences itching, pain, and irritation with the discharge.  She has a history of BV, yeast, and trichomonas.     ROS See pertinent positives and negatives per HPI.     Objective:    BP 118/76 (BP Location: Left Arm, Patient Position: Sitting, Cuff Size: Large)   Pulse 68   Temp (!) 97.5 F (36.4 C)   Ht 5' 8 (1.727 m)   Wt (!) 368 lb 12.8 oz (167.3 kg)   LMP 03/20/2024 (Exact Date)   SpO2 98%   BMI 56.08 kg/m    Physical Exam Vitals and nursing note reviewed.  Constitutional:      General: She is not in acute distress.    Appearance: Normal appearance.  HENT:     Head: Normocephalic.  Eyes:     Conjunctiva/sclera: Conjunctivae normal.  Pulmonary:     Effort: Pulmonary effort is normal.  Abdominal:     Palpations: Abdomen is soft.     Tenderness: There is no abdominal tenderness. There is no right CVA tenderness or left CVA tenderness.  Musculoskeletal:     Cervical back: Normal range of motion.  Skin:    General: Skin is  warm.  Neurological:     General: No focal deficit present.     Mental Status: She is alert and oriented to person, place, and time.  Psychiatric:        Mood and Affect: Mood normal.        Behavior: Behavior normal.        Thought Content: Thought content normal.        Judgment: Judgment normal.     Results for orders placed or performed in visit on 03/25/24  POCT urinalysis dipstick  Result Value Ref Range   Color, UA     Clarity, UA     Glucose, UA Negative Negative   Bilirubin, UA Negative    Ketones, UA Negative    Spec Grav, UA 1.015 1.010 - 1.025   Blood, UA Negative    pH, UA 6.0 5.0 - 8.0   Protein, UA Negative Negative   Urobilinogen, UA 0.2 0.2 or 1.0 E.U./dL   Nitrite, UA Negative    Leukocytes, UA Trace (A) Negative   Appearance     Odor          Assessment & Plan:   Problem List Items Addressed This Visit       Other   Vaginal discharge - Primary   Recurrent symptoms suggest possible reinfection or inadequate response to previous treatment, with differential including  recurrent BV or yeast infection. Urine analysis is normal, ruling out UTI. Discussed potential reinfection from partner and the role of antibiotics in treatment. Considered boric acid suppository to reset vaginal flora. Prescribed Flagyl  500 mg orally twice daily for 7 days and Diflucan  150 mg orally once today with a second dose in 7 days. Recommended boric acid vaginal suppository once nightly for 30 days. Advised partner to get tested/treated. Scheduled follow-up in 2 weeks for repeat swab to assess treatment efficacy.       Relevant Orders   Cervicovaginal ancillary only   POCT urinalysis dipstick (Completed)   Cervicovaginal ancillary only    Meds ordered this encounter  Medications   metroNIDAZOLE  (FLAGYL ) 500 MG tablet    Sig: Take 1 tablet (500 mg total) by mouth 2 (two) times daily for 7 days. NO alcohol.    Dispense:  14 tablet    Refill:  0   fluconazole  (DIFLUCAN ) 150 MG  tablet    Sig: Take 1 tablet (150 mg total) by mouth once a week.    Dispense:  2 tablet    Refill:  0    Return if symptoms worsen or fail to improve.  [name], NP

## 2024-03-25 NOTE — Assessment & Plan Note (Signed)
 Recurrent symptoms suggest possible reinfection or inadequate response to previous treatment, with differential including recurrent BV or yeast infection. Urine analysis is normal, ruling out UTI. Discussed potential reinfection from partner and the role of antibiotics in treatment. Considered boric acid suppository to reset vaginal flora. Prescribed Flagyl  500 mg orally twice daily for 7 days and Diflucan  150 mg orally once today with a second dose in 7 days. Recommended boric acid vaginal suppository once nightly for 30 days. Advised partner to get tested/treated. Scheduled follow-up in 2 weeks for repeat swab to assess treatment efficacy.

## 2024-03-25 NOTE — Patient Instructions (Signed)
 It was great to see you!  Start flagyl  twice a day for 7 days   Start diflucan  1 tablet today, then a second tablet in 7 days   Start boric acid vaginal suppository daily at bedtime for 30 days  Come back in 2 weeks for a swab to recheck   Consider your partner being treated  Let's follow-up if symptoms worsen or any concerns   Take care,  Tinnie Harada, NP

## 2024-03-26 ENCOUNTER — Ambulatory Visit: Payer: Self-pay | Admitting: Nurse Practitioner

## 2024-03-26 LAB — CERVICOVAGINAL ANCILLARY ONLY
Bacterial Vaginitis (gardnerella): NEGATIVE
Candida Glabrata: NEGATIVE
Candida Vaginitis: POSITIVE — AB
Chlamydia: NEGATIVE
Comment: NEGATIVE
Comment: NEGATIVE
Comment: NEGATIVE
Comment: NEGATIVE
Comment: NEGATIVE
Comment: NORMAL
Neisseria Gonorrhea: NEGATIVE
Trichomonas: POSITIVE — AB

## 2024-04-08 ENCOUNTER — Other Ambulatory Visit

## 2024-04-08 ENCOUNTER — Other Ambulatory Visit (HOSPITAL_COMMUNITY)
Admission: RE | Admit: 2024-04-08 | Discharge: 2024-04-08 | Disposition: A | Source: Ambulatory Visit | Attending: Nurse Practitioner | Admitting: Nurse Practitioner

## 2024-04-08 DIAGNOSIS — N898 Other specified noninflammatory disorders of vagina: Secondary | ICD-10-CM | POA: Insufficient documentation

## 2024-04-09 LAB — CERVICOVAGINAL ANCILLARY ONLY
Bacterial Vaginitis (gardnerella): NEGATIVE
Candida Glabrata: NEGATIVE
Candida Vaginitis: NEGATIVE
Comment: NEGATIVE
Comment: NEGATIVE
Comment: NEGATIVE
Comment: NEGATIVE
Trichomonas: NEGATIVE

## 2024-04-17 ENCOUNTER — Encounter: Admitting: Nurse Practitioner

## 2024-04-30 ENCOUNTER — Other Ambulatory Visit (HOSPITAL_COMMUNITY): Payer: Self-pay

## 2024-04-30 ENCOUNTER — Other Ambulatory Visit: Payer: Self-pay | Admitting: Nurse Practitioner

## 2024-05-01 ENCOUNTER — Other Ambulatory Visit: Payer: Self-pay

## 2024-05-01 ENCOUNTER — Other Ambulatory Visit (HOSPITAL_COMMUNITY): Payer: Self-pay

## 2024-05-01 ENCOUNTER — Telehealth: Admitting: Family Medicine

## 2024-05-01 DIAGNOSIS — J020 Streptococcal pharyngitis: Secondary | ICD-10-CM | POA: Diagnosis not present

## 2024-05-01 MED ORDER — ESOMEPRAZOLE MAGNESIUM 40 MG PO CPDR
40.0000 mg | DELAYED_RELEASE_CAPSULE | Freq: Every day | ORAL | 1 refills | Status: AC
  Filled 2024-05-01: qty 90, 90d supply, fill #0

## 2024-05-01 MED ORDER — AMOXICILLIN 875 MG PO TABS
875.0000 mg | ORAL_TABLET | Freq: Two times a day (BID) | ORAL | 0 refills | Status: AC
  Filled 2024-05-01: qty 20, 10d supply, fill #0

## 2024-05-01 MED ORDER — FLUCONAZOLE 150 MG PO TABS
ORAL_TABLET | ORAL | 0 refills | Status: DC
  Filled 2024-05-01: qty 2, 3d supply, fill #0

## 2024-05-01 NOTE — Telephone Encounter (Signed)
 Requesting: esomeprazole  (NEXIUM ) 40 MG capsule  Last Visit: 03/25/2024 Next Visit: Visit date not found Last Refill: 10/07/2023  Please Advise

## 2024-05-01 NOTE — Progress Notes (Signed)

## 2024-05-01 NOTE — Progress Notes (Signed)
 Pt requesting medication for possible yeast infection due to antibiotic use.

## 2024-05-01 NOTE — Addendum Note (Signed)
 Addended by: Jakylah Bassinger on: 05/01/2024 12:35 PM   Modules accepted: Orders

## 2024-05-04 ENCOUNTER — Encounter (HOSPITAL_COMMUNITY): Payer: Self-pay

## 2024-05-04 ENCOUNTER — Other Ambulatory Visit (HOSPITAL_COMMUNITY): Payer: Self-pay

## 2024-05-04 ENCOUNTER — Encounter: Payer: Self-pay | Admitting: Nurse Practitioner

## 2024-05-05 ENCOUNTER — Other Ambulatory Visit (HOSPITAL_COMMUNITY): Payer: Self-pay

## 2024-05-05 ENCOUNTER — Other Ambulatory Visit: Payer: Self-pay

## 2024-05-05 MED ORDER — DESVENLAFAXINE SUCCINATE ER 50 MG PO TB24
50.0000 mg | ORAL_TABLET | Freq: Every day | ORAL | 1 refills | Status: AC
  Filled 2024-05-05: qty 90, 90d supply, fill #0

## 2024-05-22 NOTE — Assessment & Plan Note (Signed)
 Chronic, stable. Check CBC and iron panel and treat based on results.

## 2024-05-22 NOTE — Progress Notes (Signed)
 "  There were no vitals taken for this visit.   Subjective:    Patient ID: Mary Brandt, female    DOB: 09/13/1985, 39 y.o.   MRN: [id number]  CC: No chief complaint on file.   HPI: Mary Brandt is a 39 y.o. female presenting on 05/25/2024 for comprehensive medical examination. Current medical complaints include:{Blank single:19197::none,***}  She currently lives with: Menopausal Symptoms: {Blank single:19197::yes,no}  Depression and Anxiety Screen done today and results listed below:     12/05/2023   11:49 AM 09/27/2022   10:04 AM 04/10/2022    8:58 AM 09/18/2021    1:23 PM 06/21/2021    3:10 PM  Depression screen PHQ 2/9  Decreased Interest 0 0 0 0 0  Down, Depressed, Hopeless 1 0 0 1 0  PHQ - 2 Score 1 0 0 1 0  Altered sleeping 0 0 0 1 0  Tired, decreased energy 0 0 0 0 0  Change in appetite 1 0 0 0 0  Feeling bad or failure about yourself  0 0 0 1 0  Trouble concentrating 0 0 0 0 0  Moving slowly or fidgety/restless 0 0 0 0 0  Suicidal thoughts 0 0 0 0 0  PHQ-9 Score 2  0  0  3  0   Difficult doing work/chores Not difficult at all   Not difficult at all      Data saved with a previous flowsheet row definition      12/05/2023   11:49 AM 09/27/2022   10:05 AM 04/10/2022    8:58 AM 09/18/2021    1:23 PM  GAD 7 : Generalized Anxiety Score  Nervous, Anxious, on Edge 1 1 1 1   Control/stop worrying 1 0 1 1  Worry too much - different things 1 1 1 1   Trouble relaxing 0 1 1 0  Restless 0 0 0 0  Easily annoyed or irritable 1 0 2 1  Afraid - awful might happen 1 1 1  0  Total GAD 7 Score 5 4 7 4   Anxiety Difficulty Somewhat difficult Not difficult at all Not difficult at all Not difficult at all    The patient {has/does not have:19849} a history of falls. I {did/did not:19850} complete a risk assessment for falls. A plan of care for falls {was/was not:19852} documented.   Past Medical History:  Past Medical History:  Diagnosis Date   Anxiety 2012   Asthma     GERD (gastroesophageal reflux disease)    Worsening in 2024   Hyperglycemia    Hyperlipemia    Morbid obesity (HCC)    sees weight loss clinic   Nevus    sees dermatologist, Dr. Helga   Obstructive sleep apnea on CPAP    Sleep apnea     Surgical History:  Past Surgical History:  Procedure Laterality Date   finger laceration Right 2008   2nd finger   implanon insertion  06-26-11   left upper arm   LAPAROSCOPIC GASTRIC SLEEVE RESECTION N/A 01/05/2019   Procedure: LAPAROSCOPIC GASTRIC SLEEVE RESECTION, UPPER ENDO, ERAS Pathway;  Surgeon: Signe Mitzie LABOR, MD;  Location: [facility];  Service: General;  Laterality: N/A;   MOLE REMOVAL     OTHER SURGICAL HISTORY Left [id number]    Implanon   WISDOM TOOTH EXTRACTION      Medications:  Current Outpatient Medications on File Prior to Visit  Medication Sig   albuterol  (VENTOLIN  HFA) 108 (90 Base) MCG/ACT inhaler Inhale 2 puffs into  the lungs every 6 (six) hours as needed for wheezing or shortness of breath.   desvenlafaxine  (PRISTIQ ) 50 MG 24 hr tablet Take 1 tablet (50 mg total) by mouth daily.   esomeprazole  (NEXIUM ) 40 MG capsule Take 1 capsule (40 mg total) by mouth daily.   fluconazole  (DIFLUCAN ) 150 MG tablet Take one tablet at initial sign of infection, if persist, take another tablet after 72 hours.   IRON-FOLIC ACID  PO Take 60 mg by mouth.   levonorgestrel  (MIRENA ) 20 MCG/24HR IUD 1 each by Intrauterine route once.   Multiple Vitamins-Minerals (MULTIVITAMIN ADULT PO) Take 1 tablet by mouth daily.    nystatin  cream (MYCOSTATIN ) Apply topically 2 (two) times daily. (Patient not taking: Reported on 03/25/2024)   [DISCONTINUED] FLUoxetine  (PROZAC ) 20 MG capsule Take 1 capsule (20 mg total) by mouth daily.   No current facility-administered medications on file prior to visit.    Allergies:  Allergies[1]  Social History:  Social History   Socioeconomic History   Marital status: Divorced    Spouse name: Beryl   Number of  children: 0   Years of education: 19   Highest education level: Professional school degree (e.g., MD, DDS, DVM, JD)  Occupational History   Occupation: LEGAL ASSISTANT    Occupation: Counselling Psychologist: [employer]  Tobacco Use   Smoking status: Never   Smokeless tobacco: Never  Vaping Use   Vaping status: Never Used  Substance and Sexual Activity   Alcohol use: Not Currently   Drug use: No   Sexual activity: Yes    Partners: Male    Birth control/protection: I.U.D.  Other Topics Concern   Not on file  Social History Narrative      Marital Status: Married Government Social Research Officer)    Children:  None    Pets:  2 dogs   Living Situation: Lives with his husband.   Occupation: Teacher, Music     Education: Engineer, Maintenance (it) ([employer]) Social [employer]er [employer])    Alcohol Use:  Occasional   Tobacco Use:  None    Drug Use:  None   Diet:  Regular   Exercise:  None   Hobbies:  Reading, Movies             Social Drivers of Health   Tobacco Use: Low Risk (03/25/2024)   Patient History    Smoking Tobacco Use: Never    Smokeless Tobacco Use: Never    Passive Exposure: Not on file  Financial Resource Strain: Low Risk (02/16/2024)   Overall Financial Resource Strain (CARDIA)    Difficulty of Paying Living Expenses: Not hard at all  Food Insecurity: No Food Insecurity (02/16/2024)   Epic    Worried About Programme Researcher, Broadcasting/film/video in the Last Year: Never true    Ran Out of Food in the Last Year: Never true  Transportation Needs: No Transportation Needs (02/16/2024)   Epic    Lack of Transportation (Medical): No    Lack of Transportation (Non-Medical): No  Physical Activity: Inactive (02/16/2024)   Exercise Vital Sign    Days of Exercise per Week: 0 days    Minutes of Exercise per Session: Not on file  Stress: Stress Concern Present (02/16/2024)   [employer] of Occupational Health - Occupational Stress Questionnaire    Feeling of Stress: To some extent  Social  Connections: Moderately Isolated (02/16/2024)   Social Connection and Isolation Panel    Frequency of Communication with Friends and Family:  More than three times a week    Frequency of Social Gatherings with Friends and Family: More than three times a week    Attends Religious Services: Never    Database Administrator or Organizations: Yes    Attends Engineer, Structural: More than 4 times per year    Marital Status: Separated  Intimate Partner Violence: Not on file  Depression (PHQ2-9): Low Risk (12/05/2023)   Depression (PHQ2-9)    PHQ-2 Score: 2  Alcohol Screen: Low Risk (02/16/2024)   Alcohol Screen    Last Alcohol Screening Score (AUDIT): 2  Housing: Low Risk (02/16/2024)   Epic    Unable to Pay for Housing in the Last Year: No    Number of Times Moved in the Last Year: 0    Homeless in the Last Year: No  Utilities: Not on file  Health Literacy: Not on file   Tobacco Use History[2] Social History   Substance and Sexual Activity  Alcohol Use Not Currently    Family History:  Family History  Problem Relation Age of Onset   Atrial fibrillation Mother    Alcohol abuse Mother    Varicose Veins Mother    Diabetes Father    Cancer Father        skin   CVA Father    Obesity Father    Stroke Father    Polycystic ovary syndrome Sister    Depression Sister    Anxiety disorder Sister    Bipolar disorder Sister    ADD / ADHD Sister    Obesity Sister    Dementia Maternal Grandmother    Early death Maternal Grandmother    Cancer Maternal Grandfather        skin    Past medical history, surgical history, medications, allergies, family history and social history reviewed with patient today and changes made to appropriate areas of the chart.   ROS All other ROS negative except what is listed above and in the HPI.      Objective:    There were no vitals taken for this visit.  Wt Readings from Last 3 Encounters:  03/25/24 (!) 368 lb 12.8 oz (167.3 kg)   02/19/24 (!) 369 lb (167.4 kg)  01/17/24 (!) 368 lb 12.8 oz (167.3 kg)    Physical Exam  Results for orders placed or performed in visit on 03/25/24  Cervicovaginal ancillary only   Collection Time: 03/25/24  9:41 AM  Result Value Ref Range   Neisseria Gonorrhea Negative    Chlamydia Negative    Trichomonas Positive (A)    Bacterial Vaginitis (gardnerella) Negative    Candida Vaginitis Positive (A)    Candida Glabrata Negative    Comment Normal Reference Ranger Chlamydia - Negative    Comment      Normal Reference Range Neisseria Gonorrhea - Negative   Comment Normal Reference Range Candida Species - Negative    Comment Normal Reference Range Candida Galbrata - Negative    Comment Normal Reference Range Trichomonas - Negative    Comment      Normal Reference Range Bacterial Vaginosis - Negative  POCT urinalysis dipstick   Collection Time: 03/25/24  9:47 AM  Result Value Ref Range   Color, UA     Clarity, UA     Glucose, UA Negative Negative   Bilirubin, UA Negative    Ketones, UA Negative    Spec Grav, UA 1.015 1.010 - 1.025   Blood, UA Negative    pH,  UA 6.0 5.0 - 8.0   Protein, UA Negative Negative   Urobilinogen, UA 0.2 0.2 or 1.0 E.U./dL   Nitrite, UA Negative    Leukocytes, UA Trace (A) Negative   Appearance     Odor    Cervicovaginal ancillary only   Collection Time: 04/08/24  8:31 AM  Result Value Ref Range   Trichomonas Negative    Bacterial Vaginitis (gardnerella) Negative    Candida Vaginitis Negative    Candida Glabrata Negative    Comment      Normal Reference Range Bacterial Vaginosis - Negative   Comment Normal Reference Range Candida Species - Negative    Comment Normal Reference Range Candida Galbrata - Negative    Comment Normal Reference Range Trichomonas - Negative       Assessment & Plan:   Problem List Items Addressed This Visit   None    Follow up plan: No follow-ups on file.  LABORATORY TESTING:  - Pap smear: {Blank  single:19197::pap done,not applicable,up to date,done elsewhere}  IMMUNIZATIONS:   - Tdap: Tetanus vaccination status reviewed: last tetanus booster within 10 years. - Influenza: {Blank single:19197::Up to date,Administered today,Postponed to flu season,Declined,Given elsewhere} - Pneumovax: Not applicable - Prevnar: Not applicable - HPV: Not applicable - Shingrix vaccine: Not applicable  SCREENING: -Mammogram: Not applicable  - Colonoscopy: Not applicable  - Bone Density: Not applicable   PATIENT COUNSELING:   Advised to take 1 mg of folate supplement per day if capable of pregnancy.   Sexuality: Discussed sexually transmitted diseases, partner selection, use of condoms, avoidance of unintended pregnancy  and contraceptive alternatives.   Advised to avoid cigarette smoking.  I discussed with the patient that most people either abstain from alcohol or drink within safe limits (<=14/week and <=4 drinks/occasion for males, <=7/weeks and <= 3 drinks/occasion for females) and that the risk for alcohol disorders and other health effects rises proportionally with the number of drinks per week and how often a drinker exceeds daily limits.  Discussed cessation/primary prevention of drug use and availability of treatment for abuse.   Diet: Encouraged to adjust caloric intake to maintain  or achieve ideal body weight, to reduce intake of dietary saturated fat and total fat, to limit sodium intake by avoiding high sodium foods and not adding table salt, and to maintain adequate dietary potassium and calcium preferably from fresh fruits, vegetables, and low-fat dairy products.    stressed the importance of regular exercise  Injury prevention: Discussed safety belts, safety helmets, smoke detector, smoking near bedding or upholstery.   Dental health: Discussed importance of regular tooth brushing, flossing, and dental visits.    NEXT PREVENTATIVE PHYSICAL DUE IN 1 YEAR. No  follow-ups on file.  Mary DELENA Harada, NP  I,Mary Brandt,acting as a scribe for [employer], NP.,have documented all relevant documentation on the behalf of Mary Paro DELENA Harada, NP.  I, Mary DELENA Harada, NP, have reviewed all documentation for this visit. The documentation on 05/25/2024 for the exam, diagnosis, procedures, and orders are all accurate and complete.    [1] No Known Allergies [2]  Social History Tobacco Use  Smoking Status Never  Smokeless Tobacco Never   "

## 2024-05-22 NOTE — Assessment & Plan Note (Signed)
 Chronic, stable. Continue Pristiq  50mg  daily and propranolol  10-20mg  BID prn. Continue collaboration and recommendations from psychiatry.

## 2024-05-22 NOTE — Assessment & Plan Note (Addendum)
 History of gastric sleeve resection in August 2020. Patient has gained 18 pounds since 07/12/2023. Ordered Vitamin B 12 and Vitamin D  for monitoring of levels secondary to the bariatric surgery.

## 2024-05-22 NOTE — Assessment & Plan Note (Signed)
 Chronic, stable. We will check A1C to monitor her prediabetes status.

## 2024-05-22 NOTE — Assessment & Plan Note (Signed)
 Health maintenance reviewed and physical exam completed. Family History and social history reviewed. Discussed nutrition, exercise. Check CMP, CBC  today. Follow-up 1 year.

## 2024-05-25 ENCOUNTER — Other Ambulatory Visit (HOSPITAL_COMMUNITY): Payer: Self-pay

## 2024-05-25 ENCOUNTER — Encounter: Payer: Self-pay | Admitting: Nurse Practitioner

## 2024-05-25 ENCOUNTER — Other Ambulatory Visit (HOSPITAL_COMMUNITY)
Admission: RE | Admit: 2024-05-25 | Discharge: 2024-05-25 | Disposition: A | Discharge status: Home / Self Care | Source: Ambulatory Visit | Attending: Nurse Practitioner | Admitting: Nurse Practitioner

## 2024-05-25 ENCOUNTER — Ambulatory Visit: Admitting: Nurse Practitioner

## 2024-05-25 VITALS — BP 132/84 | HR 77 | Temp 97.3°F | Ht 68.0 in | Wt 376.6 lb

## 2024-05-25 DIAGNOSIS — Z9884 Bariatric surgery status: Secondary | ICD-10-CM | POA: Diagnosis not present

## 2024-05-25 DIAGNOSIS — Z23 Encounter for immunization: Secondary | ICD-10-CM | POA: Diagnosis not present

## 2024-05-25 DIAGNOSIS — Z113 Encounter for screening for infections with a predominantly sexual mode of transmission: Secondary | ICD-10-CM

## 2024-05-25 DIAGNOSIS — G4733 Obstructive sleep apnea (adult) (pediatric): Secondary | ICD-10-CM | POA: Diagnosis not present

## 2024-05-25 DIAGNOSIS — Z136 Encounter for screening for cardiovascular disorders: Secondary | ICD-10-CM

## 2024-05-25 DIAGNOSIS — K219 Gastro-esophageal reflux disease without esophagitis: Secondary | ICD-10-CM | POA: Diagnosis not present

## 2024-05-25 DIAGNOSIS — J020 Streptococcal pharyngitis: Secondary | ICD-10-CM | POA: Diagnosis not present

## 2024-05-25 DIAGNOSIS — R7303 Prediabetes: Secondary | ICD-10-CM | POA: Diagnosis not present

## 2024-05-25 DIAGNOSIS — E559 Vitamin D deficiency, unspecified: Secondary | ICD-10-CM | POA: Diagnosis not present

## 2024-05-25 DIAGNOSIS — F411 Generalized anxiety disorder: Secondary | ICD-10-CM

## 2024-05-25 DIAGNOSIS — J029 Acute pharyngitis, unspecified: Secondary | ICD-10-CM

## 2024-05-25 DIAGNOSIS — Z0001 Encounter for general adult medical examination with abnormal findings: Secondary | ICD-10-CM

## 2024-05-25 DIAGNOSIS — Z0000 Encounter for general adult medical examination without abnormal findings: Secondary | ICD-10-CM

## 2024-05-25 DIAGNOSIS — D509 Iron deficiency anemia, unspecified: Secondary | ICD-10-CM

## 2024-05-25 LAB — COMPREHENSIVE METABOLIC PANEL WITH GFR
ALT: 19 U/L (ref 3–35)
AST: 18 U/L (ref 5–37)
Albumin: 4 g/dL (ref 3.5–5.2)
Alkaline Phosphatase: 96 U/L (ref 39–117)
BUN: 17 mg/dL (ref 6–23)
CO2: 29 meq/L (ref 19–32)
Calcium: 9.3 mg/dL (ref 8.4–10.5)
Chloride: 101 meq/L (ref 96–112)
Creatinine, Ser: 0.8 mg/dL (ref 0.40–1.20)
GFR: 92.78 mL/min
Glucose, Bld: 83 mg/dL (ref 70–99)
Potassium: 3.5 meq/L (ref 3.5–5.1)
Sodium: 137 meq/L (ref 135–145)
Total Bilirubin: 0.3 mg/dL (ref 0.2–1.2)
Total Protein: 7.5 g/dL (ref 6.0–8.3)

## 2024-05-25 LAB — CBC WITH DIFFERENTIAL/PLATELET
Basophils Absolute: 0 K/uL (ref 0.0–0.1)
Basophils Relative: 0.4 % (ref 0.0–3.0)
Eosinophils Absolute: 0.2 K/uL (ref 0.0–0.7)
Eosinophils Relative: 1.9 % (ref 0.0–5.0)
HCT: 41.4 % (ref 36.0–46.0)
Hemoglobin: 13.5 g/dL (ref 12.0–15.0)
Lymphocytes Relative: 18.2 % (ref 12.0–46.0)
Lymphs Abs: 1.8 K/uL (ref 0.7–4.0)
MCHC: 32.5 g/dL (ref 30.0–36.0)
MCV: 90 fl (ref 78.0–100.0)
Monocytes Absolute: 0.8 K/uL (ref 0.1–1.0)
Monocytes Relative: 7.8 % (ref 3.0–12.0)
Neutro Abs: 7.1 K/uL (ref 1.4–7.7)
Neutrophils Relative %: 71.7 % (ref 43.0–77.0)
Platelets: 348 K/uL (ref 150.0–400.0)
RBC: 4.6 Mil/uL (ref 3.87–5.11)
RDW: 14.2 % (ref 11.5–15.5)
WBC: 10 K/uL (ref 4.0–10.5)

## 2024-05-25 LAB — VITAMIN D 25 HYDROXY (VIT D DEFICIENCY, FRACTURES): VITD: 26.62 ng/mL — ABNORMAL LOW (ref 30.00–100.00)

## 2024-05-25 LAB — LIPID PANEL
Cholesterol: 176 mg/dL (ref 28–200)
HDL: 48.6 mg/dL
LDL Cholesterol: 96 mg/dL (ref 10–99)
NonHDL: 127.66
Total CHOL/HDL Ratio: 4
Triglycerides: 160 mg/dL — ABNORMAL HIGH (ref 10.0–149.0)
VLDL: 32 mg/dL (ref 0.0–40.0)

## 2024-05-25 LAB — POCT RAPID STREP A (OFFICE): Rapid Strep A Screen: POSITIVE — AB

## 2024-05-25 LAB — VITAMIN B12: Vitamin B-12: 581 pg/mL (ref 211–911)

## 2024-05-25 LAB — HEMOGLOBIN A1C: Hgb A1c MFr Bld: 6.1 % (ref 4.6–6.5)

## 2024-05-25 MED ORDER — AZITHROMYCIN 250 MG PO TABS
ORAL_TABLET | ORAL | 0 refills | Status: AC
  Filled 2024-05-25: qty 6, 5d supply, fill #0

## 2024-05-25 MED ORDER — FLUCONAZOLE 150 MG PO TABS
ORAL_TABLET | ORAL | 0 refills | Status: AC
  Filled 2024-05-25: qty 2, 3d supply, fill #0

## 2024-05-25 NOTE — Assessment & Plan Note (Signed)
Chronic, stable. Continue nexium 40mg  daily.

## 2024-05-25 NOTE — Assessment & Plan Note (Signed)
Check vitamin D today and treat based on results.  °

## 2024-05-25 NOTE — Assessment & Plan Note (Signed)
Chronic, stable. Continue CPAP at night.  

## 2024-05-25 NOTE — Patient Instructions (Signed)
 It was great to see you!  We are checking your labs today and will let you know the results via mychart/phone.   Start zpak 2 tablets today, then 1 tablet daily until gone   I have refilled the diflucan    Let's follow-up in 1 year, sooner if you have concerns.  If a referral was placed today, you will be contacted for an appointment. Please note that routine referrals can sometimes take up to 3-4 weeks to process. Please call our office if you haven't heard anything after this time frame.  Take care,  Tinnie Harada, NP

## 2024-05-26 ENCOUNTER — Ambulatory Visit: Payer: Self-pay | Admitting: Nurse Practitioner

## 2024-05-26 LAB — IRON,TIBC AND FERRITIN PANEL
%SAT: 14 % — ABNORMAL LOW (ref 16–45)
Ferritin: 17 ng/mL (ref 16–154)
Iron: 58 ug/dL (ref 40–190)
TIBC: 409 ug/dL (ref 250–450)

## 2024-05-26 LAB — URINE CYTOLOGY ANCILLARY ONLY
Chlamydia: NEGATIVE
Comment: NEGATIVE
Comment: NORMAL
Neisseria Gonorrhea: NEGATIVE

## 2024-05-26 LAB — HIV ANTIBODY (ROUTINE TESTING W REFLEX)
HIV 1&2 Ab, 4th Generation: NONREACTIVE
HIV FINAL INTERPRETATION: NEGATIVE

## 2024-06-08 ENCOUNTER — Other Ambulatory Visit (HOSPITAL_COMMUNITY): Payer: Self-pay

## 2024-06-08 MED ORDER — DESVENLAFAXINE SUCCINATE ER 50 MG PO TB24
50.0000 mg | ORAL_TABLET | Freq: Every day | ORAL | 0 refills | Status: AC
  Filled 2024-06-08: qty 90, 90d supply, fill #0

## 2024-06-25 ENCOUNTER — Ambulatory Visit

## 2025-05-26 ENCOUNTER — Encounter: Admitting: Nurse Practitioner
